# Patient Record
Sex: Female | Born: 1935 | Hispanic: No | Smoking: Former smoker
Health system: Southern US, Community
[De-identification: ages and names within clinical notes are randomized; demographics above are authoritative.]

## PROBLEM LIST (undated history)

## (undated) DIAGNOSIS — F32A Depression, unspecified: Secondary | ICD-10-CM

## (undated) DIAGNOSIS — E785 Hyperlipidemia, unspecified: Secondary | ICD-10-CM

## (undated) DIAGNOSIS — N289 Disorder of kidney and ureter, unspecified: Secondary | ICD-10-CM

## (undated) DIAGNOSIS — E119 Type 2 diabetes mellitus without complications: Secondary | ICD-10-CM

## (undated) DIAGNOSIS — I10 Essential (primary) hypertension: Secondary | ICD-10-CM

## (undated) DIAGNOSIS — I4892 Unspecified atrial flutter: Secondary | ICD-10-CM

## (undated) DIAGNOSIS — F419 Anxiety disorder, unspecified: Secondary | ICD-10-CM

## (undated) DIAGNOSIS — F329 Major depressive disorder, single episode, unspecified: Secondary | ICD-10-CM

## (undated) DIAGNOSIS — E559 Vitamin D deficiency, unspecified: Secondary | ICD-10-CM

## (undated) HISTORY — DX: Type 2 diabetes mellitus without complications: E11.9

## (undated) HISTORY — DX: Essential (primary) hypertension: I10

---

## 2008-02-02 DIAGNOSIS — I2510 Atherosclerotic heart disease of native coronary artery without angina pectoris: Secondary | ICD-10-CM | POA: Insufficient documentation

## 2011-07-24 DIAGNOSIS — Z79899 Other long term (current) drug therapy: Secondary | ICD-10-CM | POA: Diagnosis not present

## 2011-07-24 DIAGNOSIS — E785 Hyperlipidemia, unspecified: Secondary | ICD-10-CM | POA: Diagnosis not present

## 2011-07-24 DIAGNOSIS — E119 Type 2 diabetes mellitus without complications: Secondary | ICD-10-CM | POA: Diagnosis not present

## 2011-07-25 DIAGNOSIS — H04129 Dry eye syndrome of unspecified lacrimal gland: Secondary | ICD-10-CM | POA: Diagnosis not present

## 2011-07-25 DIAGNOSIS — H35049 Retinal micro-aneurysms, unspecified, unspecified eye: Secondary | ICD-10-CM | POA: Diagnosis not present

## 2011-07-25 DIAGNOSIS — H43819 Vitreous degeneration, unspecified eye: Secondary | ICD-10-CM | POA: Diagnosis not present

## 2011-07-25 DIAGNOSIS — E119 Type 2 diabetes mellitus without complications: Secondary | ICD-10-CM | POA: Diagnosis not present

## 2011-07-30 DIAGNOSIS — E785 Hyperlipidemia, unspecified: Secondary | ICD-10-CM | POA: Diagnosis not present

## 2011-07-30 DIAGNOSIS — I10 Essential (primary) hypertension: Secondary | ICD-10-CM | POA: Diagnosis not present

## 2011-07-30 DIAGNOSIS — E1139 Type 2 diabetes mellitus with other diabetic ophthalmic complication: Secondary | ICD-10-CM | POA: Diagnosis not present

## 2011-07-30 DIAGNOSIS — E1142 Type 2 diabetes mellitus with diabetic polyneuropathy: Secondary | ICD-10-CM | POA: Diagnosis not present

## 2011-07-30 DIAGNOSIS — H579 Unspecified disorder of eye and adnexa: Secondary | ICD-10-CM | POA: Diagnosis not present

## 2011-07-30 DIAGNOSIS — E1149 Type 2 diabetes mellitus with other diabetic neurological complication: Secondary | ICD-10-CM | POA: Diagnosis not present

## 2011-07-30 DIAGNOSIS — E1169 Type 2 diabetes mellitus with other specified complication: Secondary | ICD-10-CM | POA: Diagnosis not present

## 2011-07-30 DIAGNOSIS — N189 Chronic kidney disease, unspecified: Secondary | ICD-10-CM | POA: Diagnosis not present

## 2011-07-30 DIAGNOSIS — E11319 Type 2 diabetes mellitus with unspecified diabetic retinopathy without macular edema: Secondary | ICD-10-CM | POA: Diagnosis not present

## 2011-10-27 DIAGNOSIS — I10 Essential (primary) hypertension: Secondary | ICD-10-CM | POA: Diagnosis not present

## 2011-10-27 DIAGNOSIS — I2510 Atherosclerotic heart disease of native coronary artery without angina pectoris: Secondary | ICD-10-CM | POA: Diagnosis not present

## 2011-10-27 DIAGNOSIS — R32 Unspecified urinary incontinence: Secondary | ICD-10-CM | POA: Diagnosis not present

## 2011-10-27 DIAGNOSIS — E785 Hyperlipidemia, unspecified: Secondary | ICD-10-CM | POA: Diagnosis not present

## 2011-10-27 DIAGNOSIS — R7309 Other abnormal glucose: Secondary | ICD-10-CM | POA: Diagnosis not present

## 2012-01-19 DIAGNOSIS — I10 Essential (primary) hypertension: Secondary | ICD-10-CM | POA: Diagnosis not present

## 2012-01-19 DIAGNOSIS — E785 Hyperlipidemia, unspecified: Secondary | ICD-10-CM | POA: Diagnosis not present

## 2012-01-19 DIAGNOSIS — R944 Abnormal results of kidney function studies: Secondary | ICD-10-CM | POA: Diagnosis not present

## 2012-01-19 DIAGNOSIS — E119 Type 2 diabetes mellitus without complications: Secondary | ICD-10-CM | POA: Diagnosis not present

## 2012-03-16 DIAGNOSIS — K5900 Constipation, unspecified: Secondary | ICD-10-CM | POA: Diagnosis not present

## 2012-03-16 DIAGNOSIS — R61 Generalized hyperhidrosis: Secondary | ICD-10-CM | POA: Diagnosis not present

## 2012-03-16 DIAGNOSIS — K219 Gastro-esophageal reflux disease without esophagitis: Secondary | ICD-10-CM | POA: Diagnosis not present

## 2012-03-16 DIAGNOSIS — R32 Unspecified urinary incontinence: Secondary | ICD-10-CM | POA: Diagnosis not present

## 2012-03-23 DIAGNOSIS — N318 Other neuromuscular dysfunction of bladder: Secondary | ICD-10-CM | POA: Diagnosis not present

## 2012-03-23 DIAGNOSIS — R3915 Urgency of urination: Secondary | ICD-10-CM | POA: Diagnosis not present

## 2012-03-23 DIAGNOSIS — I10 Essential (primary) hypertension: Secondary | ICD-10-CM | POA: Diagnosis not present

## 2012-03-23 DIAGNOSIS — E785 Hyperlipidemia, unspecified: Secondary | ICD-10-CM | POA: Diagnosis not present

## 2012-04-20 DIAGNOSIS — Z23 Encounter for immunization: Secondary | ICD-10-CM | POA: Diagnosis not present

## 2012-04-26 DIAGNOSIS — N3941 Urge incontinence: Secondary | ICD-10-CM | POA: Diagnosis not present

## 2012-04-26 DIAGNOSIS — J329 Chronic sinusitis, unspecified: Secondary | ICD-10-CM | POA: Diagnosis not present

## 2012-06-03 DIAGNOSIS — D3130 Benign neoplasm of unspecified choroid: Secondary | ICD-10-CM | POA: Diagnosis not present

## 2012-06-07 DIAGNOSIS — L03019 Cellulitis of unspecified finger: Secondary | ICD-10-CM | POA: Diagnosis not present

## 2012-06-07 DIAGNOSIS — R32 Unspecified urinary incontinence: Secondary | ICD-10-CM | POA: Diagnosis not present

## 2012-06-07 DIAGNOSIS — L02519 Cutaneous abscess of unspecified hand: Secondary | ICD-10-CM | POA: Diagnosis not present

## 2012-06-11 DIAGNOSIS — L03019 Cellulitis of unspecified finger: Secondary | ICD-10-CM | POA: Diagnosis not present

## 2012-06-11 DIAGNOSIS — L02519 Cutaneous abscess of unspecified hand: Secondary | ICD-10-CM | POA: Diagnosis not present

## 2012-07-26 DIAGNOSIS — R079 Chest pain, unspecified: Secondary | ICD-10-CM | POA: Diagnosis not present

## 2012-07-26 DIAGNOSIS — M79609 Pain in unspecified limb: Secondary | ICD-10-CM | POA: Diagnosis not present

## 2012-07-26 DIAGNOSIS — R799 Abnormal finding of blood chemistry, unspecified: Secondary | ICD-10-CM | POA: Diagnosis not present

## 2012-08-23 DIAGNOSIS — N289 Disorder of kidney and ureter, unspecified: Secondary | ICD-10-CM | POA: Diagnosis not present

## 2012-08-23 DIAGNOSIS — R7301 Impaired fasting glucose: Secondary | ICD-10-CM | POA: Diagnosis not present

## 2012-08-23 DIAGNOSIS — R799 Abnormal finding of blood chemistry, unspecified: Secondary | ICD-10-CM | POA: Diagnosis not present

## 2012-08-27 DIAGNOSIS — E785 Hyperlipidemia, unspecified: Secondary | ICD-10-CM | POA: Diagnosis not present

## 2012-08-27 DIAGNOSIS — I10 Essential (primary) hypertension: Secondary | ICD-10-CM | POA: Diagnosis not present

## 2012-08-27 DIAGNOSIS — I2510 Atherosclerotic heart disease of native coronary artery without angina pectoris: Secondary | ICD-10-CM | POA: Diagnosis not present

## 2012-09-27 DIAGNOSIS — R7989 Other specified abnormal findings of blood chemistry: Secondary | ICD-10-CM | POA: Diagnosis not present

## 2012-09-27 DIAGNOSIS — L408 Other psoriasis: Secondary | ICD-10-CM | POA: Diagnosis not present

## 2012-09-27 DIAGNOSIS — M064 Inflammatory polyarthropathy: Secondary | ICD-10-CM | POA: Diagnosis not present

## 2012-09-27 DIAGNOSIS — M1990 Unspecified osteoarthritis, unspecified site: Secondary | ICD-10-CM | POA: Diagnosis not present

## 2012-09-27 DIAGNOSIS — R894 Abnormal immunological findings in specimens from other organs, systems and tissues: Secondary | ICD-10-CM | POA: Diagnosis not present

## 2012-09-27 DIAGNOSIS — R5383 Other fatigue: Secondary | ICD-10-CM | POA: Diagnosis not present

## 2012-09-27 DIAGNOSIS — R5381 Other malaise: Secondary | ICD-10-CM | POA: Diagnosis not present

## 2012-10-29 DIAGNOSIS — F329 Major depressive disorder, single episode, unspecified: Secondary | ICD-10-CM | POA: Diagnosis not present

## 2012-10-29 DIAGNOSIS — R5383 Other fatigue: Secondary | ICD-10-CM | POA: Diagnosis not present

## 2012-10-29 DIAGNOSIS — Z0000 Encounter for general adult medical examination without abnormal findings: Secondary | ICD-10-CM | POA: Diagnosis not present

## 2012-11-29 DIAGNOSIS — M109 Gout, unspecified: Secondary | ICD-10-CM | POA: Diagnosis not present

## 2012-12-09 DIAGNOSIS — R0789 Other chest pain: Secondary | ICD-10-CM | POA: Diagnosis not present

## 2012-12-09 DIAGNOSIS — K219 Gastro-esophageal reflux disease without esophagitis: Secondary | ICD-10-CM | POA: Diagnosis not present

## 2012-12-09 DIAGNOSIS — F329 Major depressive disorder, single episode, unspecified: Secondary | ICD-10-CM | POA: Diagnosis not present

## 2013-01-17 DIAGNOSIS — H35049 Retinal micro-aneurysms, unspecified, unspecified eye: Secondary | ICD-10-CM | POA: Diagnosis not present

## 2013-01-17 DIAGNOSIS — H26499 Other secondary cataract, unspecified eye: Secondary | ICD-10-CM | POA: Diagnosis not present

## 2013-01-17 DIAGNOSIS — H43819 Vitreous degeneration, unspecified eye: Secondary | ICD-10-CM | POA: Diagnosis not present

## 2013-01-17 DIAGNOSIS — E119 Type 2 diabetes mellitus without complications: Secondary | ICD-10-CM | POA: Diagnosis not present

## 2013-03-09 DIAGNOSIS — Z79899 Other long term (current) drug therapy: Secondary | ICD-10-CM | POA: Diagnosis not present

## 2013-03-09 DIAGNOSIS — E785 Hyperlipidemia, unspecified: Secondary | ICD-10-CM | POA: Diagnosis not present

## 2013-03-09 DIAGNOSIS — M109 Gout, unspecified: Secondary | ICD-10-CM | POA: Diagnosis not present

## 2013-03-09 DIAGNOSIS — E1149 Type 2 diabetes mellitus with other diabetic neurological complication: Secondary | ICD-10-CM | POA: Diagnosis not present

## 2013-03-09 DIAGNOSIS — E1142 Type 2 diabetes mellitus with diabetic polyneuropathy: Secondary | ICD-10-CM | POA: Diagnosis not present

## 2013-03-09 DIAGNOSIS — I10 Essential (primary) hypertension: Secondary | ICD-10-CM | POA: Diagnosis not present

## 2013-03-09 DIAGNOSIS — E1169 Type 2 diabetes mellitus with other specified complication: Secondary | ICD-10-CM | POA: Diagnosis not present

## 2013-04-12 DIAGNOSIS — Z23 Encounter for immunization: Secondary | ICD-10-CM | POA: Diagnosis not present

## 2013-04-12 DIAGNOSIS — I10 Essential (primary) hypertension: Secondary | ICD-10-CM | POA: Diagnosis not present

## 2013-04-21 DIAGNOSIS — Z79899 Other long term (current) drug therapy: Secondary | ICD-10-CM | POA: Diagnosis not present

## 2013-04-21 DIAGNOSIS — M109 Gout, unspecified: Secondary | ICD-10-CM | POA: Diagnosis not present

## 2013-04-21 DIAGNOSIS — I10 Essential (primary) hypertension: Secondary | ICD-10-CM | POA: Diagnosis not present

## 2013-06-03 DIAGNOSIS — M109 Gout, unspecified: Secondary | ICD-10-CM | POA: Diagnosis not present

## 2013-06-03 DIAGNOSIS — E1169 Type 2 diabetes mellitus with other specified complication: Secondary | ICD-10-CM | POA: Diagnosis not present

## 2013-06-03 DIAGNOSIS — E785 Hyperlipidemia, unspecified: Secondary | ICD-10-CM | POA: Diagnosis not present

## 2013-06-03 DIAGNOSIS — Z79899 Other long term (current) drug therapy: Secondary | ICD-10-CM | POA: Diagnosis not present

## 2013-06-09 DIAGNOSIS — Z23 Encounter for immunization: Secondary | ICD-10-CM | POA: Diagnosis not present

## 2013-06-09 DIAGNOSIS — E1169 Type 2 diabetes mellitus with other specified complication: Secondary | ICD-10-CM | POA: Diagnosis not present

## 2013-06-09 DIAGNOSIS — M109 Gout, unspecified: Secondary | ICD-10-CM | POA: Diagnosis not present

## 2013-06-09 DIAGNOSIS — N183 Chronic kidney disease, stage 3 unspecified: Secondary | ICD-10-CM | POA: Diagnosis not present

## 2013-06-09 DIAGNOSIS — R5383 Other fatigue: Secondary | ICD-10-CM | POA: Diagnosis not present

## 2013-06-09 DIAGNOSIS — G478 Other sleep disorders: Secondary | ICD-10-CM | POA: Diagnosis not present

## 2013-06-09 DIAGNOSIS — E785 Hyperlipidemia, unspecified: Secondary | ICD-10-CM | POA: Diagnosis not present

## 2013-06-09 DIAGNOSIS — N390 Urinary tract infection, site not specified: Secondary | ICD-10-CM | POA: Diagnosis not present

## 2013-06-09 DIAGNOSIS — I129 Hypertensive chronic kidney disease with stage 1 through stage 4 chronic kidney disease, or unspecified chronic kidney disease: Secondary | ICD-10-CM | POA: Diagnosis not present

## 2013-06-09 DIAGNOSIS — R5381 Other malaise: Secondary | ICD-10-CM | POA: Diagnosis not present

## 2013-06-09 DIAGNOSIS — R32 Unspecified urinary incontinence: Secondary | ICD-10-CM | POA: Diagnosis not present

## 2013-07-07 DIAGNOSIS — M109 Gout, unspecified: Secondary | ICD-10-CM | POA: Diagnosis not present

## 2013-07-07 DIAGNOSIS — M129 Arthropathy, unspecified: Secondary | ICD-10-CM | POA: Diagnosis not present

## 2013-07-07 DIAGNOSIS — Z79899 Other long term (current) drug therapy: Secondary | ICD-10-CM | POA: Diagnosis not present

## 2013-07-07 DIAGNOSIS — I10 Essential (primary) hypertension: Secondary | ICD-10-CM | POA: Diagnosis not present

## 2013-10-26 DIAGNOSIS — Z79899 Other long term (current) drug therapy: Secondary | ICD-10-CM | POA: Diagnosis not present

## 2013-10-26 DIAGNOSIS — E785 Hyperlipidemia, unspecified: Secondary | ICD-10-CM | POA: Diagnosis not present

## 2013-10-26 DIAGNOSIS — M109 Gout, unspecified: Secondary | ICD-10-CM | POA: Diagnosis not present

## 2013-10-26 DIAGNOSIS — E1169 Type 2 diabetes mellitus with other specified complication: Secondary | ICD-10-CM | POA: Diagnosis not present

## 2013-10-26 DIAGNOSIS — E119 Type 2 diabetes mellitus without complications: Secondary | ICD-10-CM | POA: Diagnosis not present

## 2013-11-01 DIAGNOSIS — E119 Type 2 diabetes mellitus without complications: Secondary | ICD-10-CM | POA: Diagnosis not present

## 2013-11-01 DIAGNOSIS — Z79899 Other long term (current) drug therapy: Secondary | ICD-10-CM | POA: Diagnosis not present

## 2013-11-01 DIAGNOSIS — M109 Gout, unspecified: Secondary | ICD-10-CM | POA: Diagnosis not present

## 2013-11-01 DIAGNOSIS — E1169 Type 2 diabetes mellitus with other specified complication: Secondary | ICD-10-CM | POA: Diagnosis not present

## 2013-11-01 DIAGNOSIS — E785 Hyperlipidemia, unspecified: Secondary | ICD-10-CM | POA: Diagnosis not present

## 2013-11-01 DIAGNOSIS — I10 Essential (primary) hypertension: Secondary | ICD-10-CM | POA: Diagnosis not present

## 2013-11-01 DIAGNOSIS — Z23 Encounter for immunization: Secondary | ICD-10-CM | POA: Diagnosis not present

## 2013-11-30 DIAGNOSIS — I10 Essential (primary) hypertension: Secondary | ICD-10-CM | POA: Diagnosis not present

## 2013-11-30 DIAGNOSIS — R51 Headache: Secondary | ICD-10-CM | POA: Diagnosis not present

## 2013-11-30 DIAGNOSIS — E119 Type 2 diabetes mellitus without complications: Secondary | ICD-10-CM | POA: Diagnosis not present

## 2013-12-05 DIAGNOSIS — I9589 Other hypotension: Secondary | ICD-10-CM | POA: Diagnosis not present

## 2013-12-05 DIAGNOSIS — F3289 Other specified depressive episodes: Secondary | ICD-10-CM | POA: Diagnosis not present

## 2013-12-05 DIAGNOSIS — F329 Major depressive disorder, single episode, unspecified: Secondary | ICD-10-CM | POA: Diagnosis not present

## 2013-12-05 DIAGNOSIS — N3941 Urge incontinence: Secondary | ICD-10-CM | POA: Diagnosis not present

## 2014-01-16 DIAGNOSIS — Z79899 Other long term (current) drug therapy: Secondary | ICD-10-CM | POA: Diagnosis not present

## 2014-01-16 DIAGNOSIS — F0390 Unspecified dementia without behavioral disturbance: Secondary | ICD-10-CM | POA: Diagnosis not present

## 2014-01-16 DIAGNOSIS — I10 Essential (primary) hypertension: Secondary | ICD-10-CM | POA: Diagnosis not present

## 2014-01-16 DIAGNOSIS — E1139 Type 2 diabetes mellitus with other diabetic ophthalmic complication: Secondary | ICD-10-CM | POA: Diagnosis not present

## 2014-01-16 DIAGNOSIS — F0393 Unspecified dementia, unspecified severity, with mood disturbance: Secondary | ICD-10-CM | POA: Diagnosis not present

## 2014-01-16 DIAGNOSIS — M5430 Sciatica, unspecified side: Secondary | ICD-10-CM | POA: Diagnosis not present

## 2014-01-16 DIAGNOSIS — E11319 Type 2 diabetes mellitus with unspecified diabetic retinopathy without macular edema: Secondary | ICD-10-CM | POA: Diagnosis not present

## 2014-01-19 DIAGNOSIS — D3130 Benign neoplasm of unspecified choroid: Secondary | ICD-10-CM | POA: Diagnosis not present

## 2014-01-19 DIAGNOSIS — H35379 Puckering of macula, unspecified eye: Secondary | ICD-10-CM | POA: Diagnosis not present

## 2014-01-19 DIAGNOSIS — H35049 Retinal micro-aneurysms, unspecified, unspecified eye: Secondary | ICD-10-CM | POA: Diagnosis not present

## 2014-01-19 DIAGNOSIS — H43819 Vitreous degeneration, unspecified eye: Secondary | ICD-10-CM | POA: Diagnosis not present

## 2014-01-19 DIAGNOSIS — E119 Type 2 diabetes mellitus without complications: Secondary | ICD-10-CM | POA: Diagnosis not present

## 2014-01-27 DIAGNOSIS — E1169 Type 2 diabetes mellitus with other specified complication: Secondary | ICD-10-CM | POA: Diagnosis not present

## 2014-01-27 DIAGNOSIS — Z79899 Other long term (current) drug therapy: Secondary | ICD-10-CM | POA: Diagnosis not present

## 2014-01-27 DIAGNOSIS — E785 Hyperlipidemia, unspecified: Secondary | ICD-10-CM | POA: Diagnosis not present

## 2014-02-06 DIAGNOSIS — E1139 Type 2 diabetes mellitus with other diabetic ophthalmic complication: Secondary | ICD-10-CM | POA: Diagnosis not present

## 2014-02-06 DIAGNOSIS — E11319 Type 2 diabetes mellitus with unspecified diabetic retinopathy without macular edema: Secondary | ICD-10-CM | POA: Diagnosis not present

## 2014-02-06 DIAGNOSIS — Z713 Dietary counseling and surveillance: Secondary | ICD-10-CM | POA: Diagnosis not present

## 2014-02-21 DIAGNOSIS — M545 Low back pain, unspecified: Secondary | ICD-10-CM | POA: Diagnosis not present

## 2014-02-21 DIAGNOSIS — M542 Cervicalgia: Secondary | ICD-10-CM | POA: Diagnosis not present

## 2014-02-23 DIAGNOSIS — E785 Hyperlipidemia, unspecified: Secondary | ICD-10-CM | POA: Diagnosis not present

## 2014-02-23 DIAGNOSIS — E1169 Type 2 diabetes mellitus with other specified complication: Secondary | ICD-10-CM | POA: Diagnosis not present

## 2014-02-23 DIAGNOSIS — I10 Essential (primary) hypertension: Secondary | ICD-10-CM | POA: Diagnosis not present

## 2014-02-23 DIAGNOSIS — Z79899 Other long term (current) drug therapy: Secondary | ICD-10-CM | POA: Diagnosis not present

## 2014-03-21 DIAGNOSIS — Z0000 Encounter for general adult medical examination without abnormal findings: Secondary | ICD-10-CM | POA: Diagnosis not present

## 2014-03-23 DIAGNOSIS — M542 Cervicalgia: Secondary | ICD-10-CM | POA: Diagnosis not present

## 2014-03-23 DIAGNOSIS — F0390 Unspecified dementia without behavioral disturbance: Secondary | ICD-10-CM | POA: Diagnosis not present

## 2014-03-23 DIAGNOSIS — Z79899 Other long term (current) drug therapy: Secondary | ICD-10-CM | POA: Diagnosis not present

## 2014-03-23 DIAGNOSIS — I10 Essential (primary) hypertension: Secondary | ICD-10-CM | POA: Diagnosis not present

## 2014-03-23 DIAGNOSIS — F0393 Unspecified dementia, unspecified severity, with mood disturbance: Secondary | ICD-10-CM | POA: Diagnosis not present

## 2014-03-23 DIAGNOSIS — M5430 Sciatica, unspecified side: Secondary | ICD-10-CM | POA: Diagnosis not present

## 2014-03-23 DIAGNOSIS — M79609 Pain in unspecified limb: Secondary | ICD-10-CM | POA: Diagnosis not present

## 2014-04-06 DIAGNOSIS — R5381 Other malaise: Secondary | ICD-10-CM | POA: Diagnosis not present

## 2014-04-06 DIAGNOSIS — IMO0001 Reserved for inherently not codable concepts without codable children: Secondary | ICD-10-CM | POA: Diagnosis not present

## 2014-04-06 DIAGNOSIS — M5430 Sciatica, unspecified side: Secondary | ICD-10-CM | POA: Diagnosis not present

## 2014-04-06 DIAGNOSIS — M79609 Pain in unspecified limb: Secondary | ICD-10-CM | POA: Diagnosis not present

## 2014-04-06 DIAGNOSIS — R5383 Other fatigue: Secondary | ICD-10-CM | POA: Diagnosis not present

## 2014-04-06 DIAGNOSIS — M542 Cervicalgia: Secondary | ICD-10-CM | POA: Diagnosis not present

## 2014-04-12 DIAGNOSIS — R5383 Other fatigue: Secondary | ICD-10-CM | POA: Diagnosis not present

## 2014-04-12 DIAGNOSIS — M5430 Sciatica, unspecified side: Secondary | ICD-10-CM | POA: Diagnosis not present

## 2014-04-12 DIAGNOSIS — R5381 Other malaise: Secondary | ICD-10-CM | POA: Diagnosis not present

## 2014-04-12 DIAGNOSIS — M542 Cervicalgia: Secondary | ICD-10-CM | POA: Diagnosis not present

## 2014-04-12 DIAGNOSIS — IMO0001 Reserved for inherently not codable concepts without codable children: Secondary | ICD-10-CM | POA: Diagnosis not present

## 2014-04-12 DIAGNOSIS — M79609 Pain in unspecified limb: Secondary | ICD-10-CM | POA: Diagnosis not present

## 2014-04-19 DIAGNOSIS — R5381 Other malaise: Secondary | ICD-10-CM | POA: Diagnosis not present

## 2014-04-19 DIAGNOSIS — IMO0001 Reserved for inherently not codable concepts without codable children: Secondary | ICD-10-CM | POA: Diagnosis not present

## 2014-04-19 DIAGNOSIS — R5383 Other fatigue: Secondary | ICD-10-CM | POA: Diagnosis not present

## 2014-04-19 DIAGNOSIS — M5430 Sciatica, unspecified side: Secondary | ICD-10-CM | POA: Diagnosis not present

## 2014-04-19 DIAGNOSIS — M79609 Pain in unspecified limb: Secondary | ICD-10-CM | POA: Diagnosis not present

## 2014-04-19 DIAGNOSIS — M542 Cervicalgia: Secondary | ICD-10-CM | POA: Diagnosis not present

## 2014-04-26 DIAGNOSIS — F442 Dissociative stupor: Secondary | ICD-10-CM | POA: Diagnosis not present

## 2014-04-26 DIAGNOSIS — R531 Weakness: Secondary | ICD-10-CM | POA: Diagnosis not present

## 2014-04-26 DIAGNOSIS — M4802 Spinal stenosis, cervical region: Secondary | ICD-10-CM | POA: Diagnosis not present

## 2014-04-26 DIAGNOSIS — M4800 Spinal stenosis, site unspecified: Secondary | ICD-10-CM | POA: Diagnosis not present

## 2014-05-03 DIAGNOSIS — R531 Weakness: Secondary | ICD-10-CM | POA: Diagnosis not present

## 2014-05-03 DIAGNOSIS — M4800 Spinal stenosis, site unspecified: Secondary | ICD-10-CM | POA: Diagnosis not present

## 2014-05-03 DIAGNOSIS — M109 Gout, unspecified: Secondary | ICD-10-CM | POA: Diagnosis not present

## 2014-05-03 DIAGNOSIS — F442 Dissociative stupor: Secondary | ICD-10-CM | POA: Diagnosis not present

## 2014-05-03 DIAGNOSIS — E11319 Type 2 diabetes mellitus with unspecified diabetic retinopathy without macular edema: Secondary | ICD-10-CM | POA: Diagnosis not present

## 2014-05-03 DIAGNOSIS — I10 Essential (primary) hypertension: Secondary | ICD-10-CM | POA: Diagnosis not present

## 2014-05-03 DIAGNOSIS — M4802 Spinal stenosis, cervical region: Secondary | ICD-10-CM | POA: Diagnosis not present

## 2014-05-03 DIAGNOSIS — E1169 Type 2 diabetes mellitus with other specified complication: Secondary | ICD-10-CM | POA: Diagnosis not present

## 2014-05-03 HISTORY — DX: Gout, unspecified: M10.9

## 2014-05-10 DIAGNOSIS — M4800 Spinal stenosis, site unspecified: Secondary | ICD-10-CM | POA: Diagnosis not present

## 2014-05-10 DIAGNOSIS — F442 Dissociative stupor: Secondary | ICD-10-CM | POA: Diagnosis not present

## 2014-05-10 DIAGNOSIS — R531 Weakness: Secondary | ICD-10-CM | POA: Diagnosis not present

## 2014-05-10 DIAGNOSIS — M4802 Spinal stenosis, cervical region: Secondary | ICD-10-CM | POA: Diagnosis not present

## 2014-05-11 DIAGNOSIS — R197 Diarrhea, unspecified: Secondary | ICD-10-CM | POA: Diagnosis not present

## 2014-05-11 DIAGNOSIS — E785 Hyperlipidemia, unspecified: Secondary | ICD-10-CM | POA: Diagnosis not present

## 2014-05-11 DIAGNOSIS — R634 Abnormal weight loss: Secondary | ICD-10-CM | POA: Diagnosis not present

## 2014-05-11 DIAGNOSIS — R748 Abnormal levels of other serum enzymes: Secondary | ICD-10-CM | POA: Diagnosis not present

## 2014-05-11 DIAGNOSIS — E1169 Type 2 diabetes mellitus with other specified complication: Secondary | ICD-10-CM | POA: Diagnosis not present

## 2014-05-19 DIAGNOSIS — F329 Major depressive disorder, single episode, unspecified: Secondary | ICD-10-CM | POA: Diagnosis not present

## 2014-05-19 DIAGNOSIS — R32 Unspecified urinary incontinence: Secondary | ICD-10-CM | POA: Diagnosis not present

## 2014-05-19 DIAGNOSIS — E785 Hyperlipidemia, unspecified: Secondary | ICD-10-CM | POA: Diagnosis not present

## 2014-05-19 DIAGNOSIS — I10 Essential (primary) hypertension: Secondary | ICD-10-CM | POA: Diagnosis not present

## 2014-05-19 DIAGNOSIS — K219 Gastro-esophageal reflux disease without esophagitis: Secondary | ICD-10-CM | POA: Diagnosis not present

## 2014-05-19 DIAGNOSIS — N183 Chronic kidney disease, stage 3 (moderate): Secondary | ICD-10-CM | POA: Diagnosis not present

## 2014-05-19 DIAGNOSIS — E1122 Type 2 diabetes mellitus with diabetic chronic kidney disease: Secondary | ICD-10-CM | POA: Diagnosis not present

## 2014-05-19 DIAGNOSIS — I2510 Atherosclerotic heart disease of native coronary artery without angina pectoris: Secondary | ICD-10-CM | POA: Diagnosis not present

## 2014-06-26 DIAGNOSIS — R159 Full incontinence of feces: Secondary | ICD-10-CM | POA: Diagnosis not present

## 2014-07-19 DIAGNOSIS — K219 Gastro-esophageal reflux disease without esophagitis: Secondary | ICD-10-CM | POA: Diagnosis not present

## 2014-07-19 DIAGNOSIS — E559 Vitamin D deficiency, unspecified: Secondary | ICD-10-CM | POA: Diagnosis not present

## 2014-07-19 DIAGNOSIS — N183 Chronic kidney disease, stage 3 (moderate): Secondary | ICD-10-CM | POA: Diagnosis not present

## 2014-07-19 DIAGNOSIS — E785 Hyperlipidemia, unspecified: Secondary | ICD-10-CM | POA: Diagnosis not present

## 2014-07-19 DIAGNOSIS — E119 Type 2 diabetes mellitus without complications: Secondary | ICD-10-CM | POA: Diagnosis not present

## 2014-07-19 DIAGNOSIS — F329 Major depressive disorder, single episode, unspecified: Secondary | ICD-10-CM | POA: Diagnosis not present

## 2014-07-19 DIAGNOSIS — M109 Gout, unspecified: Secondary | ICD-10-CM | POA: Diagnosis not present

## 2014-07-19 DIAGNOSIS — I10 Essential (primary) hypertension: Secondary | ICD-10-CM | POA: Diagnosis not present

## 2014-09-11 DIAGNOSIS — I10 Essential (primary) hypertension: Secondary | ICD-10-CM | POA: Diagnosis not present

## 2014-09-11 DIAGNOSIS — R0989 Other specified symptoms and signs involving the circulatory and respiratory systems: Secondary | ICD-10-CM | POA: Diagnosis not present

## 2014-09-11 DIAGNOSIS — K529 Noninfective gastroenteritis and colitis, unspecified: Secondary | ICD-10-CM | POA: Diagnosis not present

## 2014-09-18 DIAGNOSIS — N261 Atrophy of kidney (terminal): Secondary | ICD-10-CM | POA: Diagnosis not present

## 2014-09-18 DIAGNOSIS — E1122 Type 2 diabetes mellitus with diabetic chronic kidney disease: Secondary | ICD-10-CM | POA: Diagnosis not present

## 2014-09-18 DIAGNOSIS — R0989 Other specified symptoms and signs involving the circulatory and respiratory systems: Secondary | ICD-10-CM | POA: Diagnosis not present

## 2014-09-18 DIAGNOSIS — N183 Chronic kidney disease, stage 3 (moderate): Secondary | ICD-10-CM | POA: Diagnosis not present

## 2014-11-16 DIAGNOSIS — M545 Low back pain: Secondary | ICD-10-CM | POA: Diagnosis not present

## 2014-11-16 DIAGNOSIS — M79646 Pain in unspecified finger(s): Secondary | ICD-10-CM | POA: Diagnosis not present

## 2014-11-16 DIAGNOSIS — M6530 Trigger finger, unspecified finger: Secondary | ICD-10-CM | POA: Diagnosis not present

## 2014-11-16 DIAGNOSIS — M542 Cervicalgia: Secondary | ICD-10-CM | POA: Diagnosis not present

## 2014-11-22 DIAGNOSIS — E559 Vitamin D deficiency, unspecified: Secondary | ICD-10-CM | POA: Diagnosis not present

## 2014-12-05 DIAGNOSIS — M545 Low back pain: Secondary | ICD-10-CM | POA: Diagnosis not present

## 2014-12-05 DIAGNOSIS — M19041 Primary osteoarthritis, right hand: Secondary | ICD-10-CM | POA: Diagnosis not present

## 2014-12-05 DIAGNOSIS — M542 Cervicalgia: Secondary | ICD-10-CM | POA: Diagnosis not present

## 2014-12-11 DIAGNOSIS — M545 Low back pain: Secondary | ICD-10-CM | POA: Diagnosis not present

## 2014-12-12 DIAGNOSIS — N3941 Urge incontinence: Secondary | ICD-10-CM | POA: Diagnosis not present

## 2014-12-12 DIAGNOSIS — N3944 Nocturnal enuresis: Secondary | ICD-10-CM | POA: Diagnosis not present

## 2014-12-12 DIAGNOSIS — R351 Nocturia: Secondary | ICD-10-CM | POA: Diagnosis not present

## 2014-12-15 DIAGNOSIS — R42 Dizziness and giddiness: Secondary | ICD-10-CM | POA: Diagnosis not present

## 2014-12-15 DIAGNOSIS — M545 Low back pain: Secondary | ICD-10-CM | POA: Diagnosis not present

## 2014-12-20 DIAGNOSIS — M545 Low back pain: Secondary | ICD-10-CM | POA: Diagnosis not present

## 2014-12-21 DIAGNOSIS — M545 Low back pain: Secondary | ICD-10-CM | POA: Diagnosis not present

## 2014-12-25 DIAGNOSIS — M545 Low back pain: Secondary | ICD-10-CM | POA: Diagnosis not present

## 2014-12-27 DIAGNOSIS — M545 Low back pain: Secondary | ICD-10-CM | POA: Diagnosis not present

## 2015-01-01 DIAGNOSIS — M545 Low back pain: Secondary | ICD-10-CM | POA: Diagnosis not present

## 2015-01-02 DIAGNOSIS — M19041 Primary osteoarthritis, right hand: Secondary | ICD-10-CM | POA: Diagnosis not present

## 2015-01-02 DIAGNOSIS — M545 Low back pain: Secondary | ICD-10-CM | POA: Diagnosis not present

## 2015-01-02 DIAGNOSIS — M5441 Lumbago with sciatica, right side: Secondary | ICD-10-CM | POA: Diagnosis not present

## 2015-01-03 DIAGNOSIS — M545 Low back pain: Secondary | ICD-10-CM | POA: Diagnosis not present

## 2015-01-17 DIAGNOSIS — I10 Essential (primary) hypertension: Secondary | ICD-10-CM | POA: Diagnosis not present

## 2015-01-17 DIAGNOSIS — N3941 Urge incontinence: Secondary | ICD-10-CM | POA: Diagnosis not present

## 2015-01-17 DIAGNOSIS — N3944 Nocturnal enuresis: Secondary | ICD-10-CM | POA: Diagnosis not present

## 2015-01-17 DIAGNOSIS — I2510 Atherosclerotic heart disease of native coronary artery without angina pectoris: Secondary | ICD-10-CM | POA: Diagnosis not present

## 2015-01-17 DIAGNOSIS — R351 Nocturia: Secondary | ICD-10-CM | POA: Diagnosis not present

## 2015-01-17 DIAGNOSIS — E785 Hyperlipidemia, unspecified: Secondary | ICD-10-CM | POA: Diagnosis not present

## 2015-01-17 DIAGNOSIS — R32 Unspecified urinary incontinence: Secondary | ICD-10-CM | POA: Diagnosis not present

## 2015-01-17 DIAGNOSIS — M1990 Unspecified osteoarthritis, unspecified site: Secondary | ICD-10-CM | POA: Diagnosis not present

## 2015-01-17 DIAGNOSIS — E1122 Type 2 diabetes mellitus with diabetic chronic kidney disease: Secondary | ICD-10-CM | POA: Diagnosis not present

## 2015-01-17 DIAGNOSIS — K219 Gastro-esophageal reflux disease without esophagitis: Secondary | ICD-10-CM | POA: Diagnosis not present

## 2015-01-17 DIAGNOSIS — F329 Major depressive disorder, single episode, unspecified: Secondary | ICD-10-CM | POA: Diagnosis not present

## 2015-01-17 DIAGNOSIS — E559 Vitamin D deficiency, unspecified: Secondary | ICD-10-CM | POA: Diagnosis not present

## 2015-01-17 DIAGNOSIS — Z7189 Other specified counseling: Secondary | ICD-10-CM | POA: Diagnosis not present

## 2015-01-17 DIAGNOSIS — N183 Chronic kidney disease, stage 3 (moderate): Secondary | ICD-10-CM | POA: Diagnosis not present

## 2015-01-24 DIAGNOSIS — H26492 Other secondary cataract, left eye: Secondary | ICD-10-CM | POA: Diagnosis not present

## 2015-01-24 DIAGNOSIS — H01123 Discoid lupus erythematosus of right eye, unspecified eyelid: Secondary | ICD-10-CM | POA: Diagnosis not present

## 2015-01-24 DIAGNOSIS — H43812 Vitreous degeneration, left eye: Secondary | ICD-10-CM | POA: Diagnosis not present

## 2015-01-24 DIAGNOSIS — E119 Type 2 diabetes mellitus without complications: Secondary | ICD-10-CM | POA: Diagnosis not present

## 2015-01-24 DIAGNOSIS — H35371 Puckering of macula, right eye: Secondary | ICD-10-CM | POA: Diagnosis not present

## 2015-01-24 DIAGNOSIS — D3132 Benign neoplasm of left choroid: Secondary | ICD-10-CM | POA: Diagnosis not present

## 2015-01-29 DIAGNOSIS — I10 Essential (primary) hypertension: Secondary | ICD-10-CM | POA: Diagnosis not present

## 2015-01-30 DIAGNOSIS — M25531 Pain in right wrist: Secondary | ICD-10-CM | POA: Diagnosis not present

## 2015-01-30 DIAGNOSIS — M4722 Other spondylosis with radiculopathy, cervical region: Secondary | ICD-10-CM | POA: Diagnosis not present

## 2015-01-30 DIAGNOSIS — M542 Cervicalgia: Secondary | ICD-10-CM | POA: Diagnosis not present

## 2015-03-01 DIAGNOSIS — R197 Diarrhea, unspecified: Secondary | ICD-10-CM | POA: Diagnosis not present

## 2015-04-02 DIAGNOSIS — N183 Chronic kidney disease, stage 3 (moderate): Secondary | ICD-10-CM | POA: Diagnosis not present

## 2015-04-02 DIAGNOSIS — E559 Vitamin D deficiency, unspecified: Secondary | ICD-10-CM | POA: Diagnosis not present

## 2015-05-16 DIAGNOSIS — I10 Essential (primary) hypertension: Secondary | ICD-10-CM | POA: Diagnosis not present

## 2015-05-16 DIAGNOSIS — E1122 Type 2 diabetes mellitus with diabetic chronic kidney disease: Secondary | ICD-10-CM | POA: Diagnosis not present

## 2015-05-16 DIAGNOSIS — Z23 Encounter for immunization: Secondary | ICD-10-CM | POA: Diagnosis not present

## 2015-05-16 DIAGNOSIS — I2510 Atherosclerotic heart disease of native coronary artery without angina pectoris: Secondary | ICD-10-CM | POA: Diagnosis not present

## 2015-05-16 DIAGNOSIS — N183 Chronic kidney disease, stage 3 (moderate): Secondary | ICD-10-CM | POA: Diagnosis not present

## 2015-05-16 DIAGNOSIS — E559 Vitamin D deficiency, unspecified: Secondary | ICD-10-CM | POA: Diagnosis not present

## 2015-05-16 DIAGNOSIS — K219 Gastro-esophageal reflux disease without esophagitis: Secondary | ICD-10-CM | POA: Diagnosis not present

## 2015-05-16 DIAGNOSIS — F329 Major depressive disorder, single episode, unspecified: Secondary | ICD-10-CM | POA: Diagnosis not present

## 2015-05-16 DIAGNOSIS — E785 Hyperlipidemia, unspecified: Secondary | ICD-10-CM | POA: Diagnosis not present

## 2015-05-16 DIAGNOSIS — R32 Unspecified urinary incontinence: Secondary | ICD-10-CM | POA: Diagnosis not present

## 2015-05-16 DIAGNOSIS — M1990 Unspecified osteoarthritis, unspecified site: Secondary | ICD-10-CM | POA: Diagnosis not present

## 2015-05-17 DIAGNOSIS — R351 Nocturia: Secondary | ICD-10-CM | POA: Diagnosis not present

## 2015-06-16 DIAGNOSIS — M5489 Other dorsalgia: Secondary | ICD-10-CM | POA: Diagnosis not present

## 2015-06-29 DIAGNOSIS — H43812 Vitreous degeneration, left eye: Secondary | ICD-10-CM | POA: Diagnosis not present

## 2015-06-29 DIAGNOSIS — H04123 Dry eye syndrome of bilateral lacrimal glands: Secondary | ICD-10-CM | POA: Diagnosis not present

## 2015-06-29 DIAGNOSIS — Z961 Presence of intraocular lens: Secondary | ICD-10-CM | POA: Diagnosis not present

## 2015-06-29 DIAGNOSIS — E119 Type 2 diabetes mellitus without complications: Secondary | ICD-10-CM | POA: Diagnosis not present

## 2015-06-29 DIAGNOSIS — D3132 Benign neoplasm of left choroid: Secondary | ICD-10-CM | POA: Diagnosis not present

## 2015-06-29 DIAGNOSIS — H26492 Other secondary cataract, left eye: Secondary | ICD-10-CM | POA: Diagnosis not present

## 2015-06-29 DIAGNOSIS — I10 Essential (primary) hypertension: Secondary | ICD-10-CM | POA: Diagnosis not present

## 2015-06-29 DIAGNOSIS — H35371 Puckering of macula, right eye: Secondary | ICD-10-CM | POA: Diagnosis not present

## 2015-06-29 DIAGNOSIS — Z8673 Personal history of transient ischemic attack (TIA), and cerebral infarction without residual deficits: Secondary | ICD-10-CM | POA: Diagnosis not present

## 2015-06-29 DIAGNOSIS — H532 Diplopia: Secondary | ICD-10-CM | POA: Diagnosis not present

## 2015-07-05 DIAGNOSIS — E1122 Type 2 diabetes mellitus with diabetic chronic kidney disease: Secondary | ICD-10-CM | POA: Diagnosis not present

## 2015-08-15 DIAGNOSIS — I10 Essential (primary) hypertension: Secondary | ICD-10-CM | POA: Diagnosis not present

## 2015-08-15 DIAGNOSIS — F329 Major depressive disorder, single episode, unspecified: Secondary | ICD-10-CM | POA: Diagnosis not present

## 2015-08-15 DIAGNOSIS — E785 Hyperlipidemia, unspecified: Secondary | ICD-10-CM | POA: Diagnosis not present

## 2015-08-15 DIAGNOSIS — K219 Gastro-esophageal reflux disease without esophagitis: Secondary | ICD-10-CM | POA: Diagnosis not present

## 2015-08-15 DIAGNOSIS — I2510 Atherosclerotic heart disease of native coronary artery without angina pectoris: Secondary | ICD-10-CM | POA: Diagnosis not present

## 2015-08-15 DIAGNOSIS — N183 Chronic kidney disease, stage 3 (moderate): Secondary | ICD-10-CM | POA: Diagnosis not present

## 2015-08-15 DIAGNOSIS — Z23 Encounter for immunization: Secondary | ICD-10-CM | POA: Diagnosis not present

## 2015-08-15 DIAGNOSIS — E1122 Type 2 diabetes mellitus with diabetic chronic kidney disease: Secondary | ICD-10-CM | POA: Diagnosis not present

## 2015-08-15 DIAGNOSIS — E559 Vitamin D deficiency, unspecified: Secondary | ICD-10-CM | POA: Diagnosis not present

## 2015-08-21 DIAGNOSIS — F321 Major depressive disorder, single episode, moderate: Secondary | ICD-10-CM | POA: Diagnosis not present

## 2015-08-21 DIAGNOSIS — F4321 Adjustment disorder with depressed mood: Secondary | ICD-10-CM | POA: Diagnosis not present

## 2015-08-22 DIAGNOSIS — M79672 Pain in left foot: Secondary | ICD-10-CM | POA: Diagnosis not present

## 2015-09-04 DIAGNOSIS — F321 Major depressive disorder, single episode, moderate: Secondary | ICD-10-CM | POA: Diagnosis not present

## 2015-09-04 DIAGNOSIS — F4321 Adjustment disorder with depressed mood: Secondary | ICD-10-CM | POA: Diagnosis not present

## 2015-09-07 ENCOUNTER — Ambulatory Visit: Payer: Self-pay

## 2015-09-12 ENCOUNTER — Other Ambulatory Visit: Payer: Self-pay

## 2015-09-12 VITALS — Wt 160.0 lb

## 2015-09-12 DIAGNOSIS — E119 Type 2 diabetes mellitus without complications: Secondary | ICD-10-CM | POA: Insufficient documentation

## 2015-09-12 DIAGNOSIS — L299 Pruritus, unspecified: Secondary | ICD-10-CM

## 2015-09-12 DIAGNOSIS — I10 Essential (primary) hypertension: Secondary | ICD-10-CM

## 2015-09-12 DIAGNOSIS — F32A Depression, unspecified: Secondary | ICD-10-CM

## 2015-09-12 DIAGNOSIS — E1110 Type 2 diabetes mellitus with ketoacidosis without coma: Secondary | ICD-10-CM

## 2015-09-12 DIAGNOSIS — F329 Major depressive disorder, single episode, unspecified: Secondary | ICD-10-CM

## 2015-09-12 NOTE — Patient Outreach (Addendum)
[facility]) Care Management  09/12/2015  Emily Waller Nov 18, 1927 [id number]  Referral Date:  09/03/2015 Source:  MD Issue:  DM, HTN.  Patient has depression and health issues; she needs a Marine scientist and Education officer, museum.   Outreach call #1 to patient.  Patient not reached.  RN CM left HIPAA compliant voice message with name and number for call back.  RN CM scheduled for next contact call within 1 week.  Patient returned call back and screening completed.    Providers: Primary MD: Dr. Orpah Melter  HH: none  Insurance:  Medicare   Social: Patient lives in her daughters basement apartment but family works during the day.  Patient considering getting additional help and that she can afford to do this.  States of past living arrangement at [facility] prior to moving to daughters home/basement.  Mobility: Ambulates without any assistive devices.   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   Falls: none  Fear of falling and has not been going outside alone due to fear of falling.   Pain: back Depression:  Active with counseling for issues surrounding around grieving over her husband.  States her church was not supportive of her loss during that time and patient felt abandoned.  Transportation: self or family Caregiver: daughter Emily Waller is a retired Community education officer.  Scientist, physiological: Resources:  SS, Firefighter DME:  CBG meter, BP cuff, (no home scales)  THN conditions:  DM, HTN Patient not checking home BS and BP cuff in the home but states she is not able to self check her own and needs someone else to check.  Weight 160   Skin: itchy - unknown cause/diagnosis.  Patient Itching in hair, back of neck and down one side of backside of body for a couple of months.  Patient has not reported to the MD.  Patient states she knows she has a lot of allergies but has never experienced this type of itching.  States she does not  believe it to be related to any of her medications.   Medications:  Patient taking more than 10 medications  Co-pay cost issues: none Flu Vaccine: 05/16/15 Pneumonia Vaccine:  2013 and 2017 Patient has been taking her own medications.  Patient realizes she may need additional help with her medications due to noting short term member issues.  Patient needs medication management assistance.  Plan:  [facility] RN CM Referral  HTN, DM -Medication  Management tips and education (perhaps family can prepare med box for patient).  -Needs instruction on use of CBG meter and BP cuff. -F/u with patient regarding if patient scheduled MD appt to f/u on itching.    [facility] Pharmacy Referral  -taking more than 10 medications  SW -No SW needs identified this call.   Patient active with counseling and denied any financial issues.   -RN CM to continue to assess for any new issues.   RN CM advised in next contact call with [place] within next 10 business days.  RN CM advised to please notify MD of any changes in condition prior to scheduled appt's.   RN CM provided contact name and # [phone] or main office # [phone] and 24-hour nurse line # [phone].  RN CM confirmed patient is aware of 911 services for urgent emergency needs.  Mariann Laster, RN, BSN, [facility], CCM  [facility], general practice Coordinator [phone] Direct [phone] Cell [phone] Office  [phone] Fax

## 2015-09-12 NOTE — Addendum Note (Signed)
Addended by: Standley Brooking on: 09/12/2015 03:39 PM   Modules accepted: Orders

## 2015-09-13 ENCOUNTER — Ambulatory Visit: Payer: Medicare Other

## 2015-09-13 DIAGNOSIS — L309 Dermatitis, unspecified: Secondary | ICD-10-CM | POA: Diagnosis not present

## 2015-09-14 ENCOUNTER — Other Ambulatory Visit: Payer: Self-pay

## 2015-09-14 NOTE — Patient Outreach (Addendum)
[facility]) Care Management  09/14/2015  SYDNEI LAMONDA 09-May-1928 [id number]   Assessment: referral received per telephonic care coordinator. RNCM called to schedule home visit. Member agreed to answer some questions telephonically. Member reports decrease in activity over the years. Member states she lives in daughter/son in [place] home. Member reports seeing her primary care provider yesterday regarding itching and reports she was prescribed cream for allergy itching. Member also reports she has gotten back into the church and is going to counseling for some anxiety/depression. Member reports A1C around 6.  Member reports she saw her doctor regarding itching and reports it is allergies. Has been given medication to help relieve itching.  Plan: home visit March to complete assessment of care management needs.  Thea Silversmith, RN, MSN, [place] Coordinator Cell: [phone]

## 2015-09-24 DIAGNOSIS — F4321 Adjustment disorder with depressed mood: Secondary | ICD-10-CM | POA: Diagnosis not present

## 2015-09-24 DIAGNOSIS — F321 Major depressive disorder, single episode, moderate: Secondary | ICD-10-CM | POA: Diagnosis not present

## 2015-09-25 ENCOUNTER — Other Ambulatory Visit: Payer: Self-pay

## 2015-09-25 VITALS — BP 108/54 | HR 71 | Resp 18 | Ht 66.0 in | Wt 166.0 lb

## 2015-09-25 DIAGNOSIS — Z748 Other problems related to care provider dependency: Secondary | ICD-10-CM

## 2015-09-25 NOTE — Patient Outreach (Addendum)
[facility]) Care Management  [place]  09/25/2015   Emily Waller [birth year] [id number]  Subjective: member reports she has been preparing her medications, but would rather someone assist her with managing her medications. Member also states, "I am depressed"  Objective: BP 108/54 mmHg  Pulse 71  Resp 18  Ht 1.676 m (5\' 6" )  Wt 166 lb (75.297 kg)  BMI 26.81 kg/m2  SpO2 97% Lungs clear, respiration even unlabored, several red splotches noted to upper back not raised. Heart rate regular.  Current Medications:  Outpatient Encounter Prescriptions as of 09/25/2015  Medication Sig  . allopurinol (ZYLOPRIM) 300 MG tablet Take 300 mg by mouth daily.  Marland Kitchen amLODipine (NORVASC) 5 MG tablet Take 5 mg by mouth daily.  Marland Kitchen atorvastatin (LIPITOR) 10 MG tablet Take 10 mg by mouth daily.  . Coenzyme Q10-Fish Oil-Vit E (CO-Q 10 OMEGA-3 FISH OIL) CAPS Take 1 capsule by mouth daily.  . Ergocalciferol (VITAMIN D2 PO) Take 1 capsule by mouth once a week.  . ezetimibe (ZETIA) 10 MG tablet Take 10 mg by mouth daily.  Marland Kitchen lisinopril-hydrochlorothiazide (PRINZIDE,ZESTORETIC) 20-25 MG tablet Take 1 tablet by mouth daily.  Marland Kitchen loperamide (IMODIUM) 2 MG capsule Take 2 mg by mouth daily.  . mirabegron ER (MYRBETRIQ) 50 MG TB24 tablet Take 50 mg by mouth daily.  Marland Kitchen oxybutynin (DITROPAN-XL) 10 MG 24 hr tablet Take 10 mg by mouth daily.  . pantoprazole (PROTONIX) 40 MG tablet Take 40 mg by mouth daily.  Vladimir Faster Glycol-Propyl Glycol (SYSTANE ULTRA OP) Apply 1 drop to eye 2 (two) times daily.  . sertraline (ZOLOFT) 100 MG tablet Take 100 mg by mouth daily.  Marland Kitchen triamcinolone cream (KENALOG) 0.1 % Apply 1 application topically as needed.   No facility-administered encounter medications on file as of 09/25/2015.    Functional Status:  In your present state of health, do you have any difficulty performing the following activities: 09/25/2015  Hearing? Y  Vision? N  Difficulty concentrating or  making decisions? N  Walking or climbing stairs? Y  Dressing or bathing? N  Doing errands, shopping? Y  Preparing Food and eating ? N  Using the Toilet? N  In the past six months, have you accidently leaked urine? Y  Do you have problems with loss of bowel control? Y  Managing your Medications? Y  Managing your Finances? N  Housekeeping or managing your Housekeeping? N    Fall/Depression Screening: PHQ 2/9 Scores 09/25/2015 09/12/2015  PHQ - 2 Score 1 2  PHQ- 9 Score 11 3   Fall Risk  09/25/2015 09/12/2015  Falls in the past year? No No     Assessment: 82 year old referral per primary care re: Member had depression and health issues". Member is very talkative and frequently answers questions with a story. Home assessment completed. Member lives in basement portion of her daughter's and son-in-law's home that they have set up for her. Member has unlimited access to amenities upstairs. Member is careful about going up and down the stairs, holding onto the rails, RNCM reinforced limiting throw rugs and adequate lighting. There is an exit to outside from the basement.    History of depression-Member PHQ 2 SCORE 1 PHQ 9 score 11- Member acknowledges depression, is on medication and member reprots she has been going to counseling, but states she does not think it is benefiting her, stating she plans to go to one more session and then she will stop. RNCM discussed social work  assistance and member initially declines, but agrees for social worker to discuss other options that may be available to her. Member states she has become more active in the church.   Transportation needs. 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 Member would like to know what her options are for transportation.  Member is interested in increasing socialization and expresses how she used to be very active, running. RNCM  discussed the Providers Referral Exercise Program. Brochure provided.   Re: Medications-member is knowledgeable about her medications, But states she would like someone else to fill her pill box. Member is agreeable to [employer] Program referral. RNCM called and left member's contact number.  History of diabetes-Member does not check her blood sugar. RNCM taught member how to use her glucose member using teach-back method. Blood sugar 104 today. Teaching also completed on how to use blood pressure machine. [facility] calender organizer provided for member to record readings. Member also request RNCM demonstrate use of automatic blood pressure cuff. RNCM provided education on blood pressure cuff use. RNCM encouraged member to record readings in [facility] calender/organizer and take to office visits.   Plan: home visit in 3-4 weeks. THN CM Care Plan Problem One        Most Recent Value   Care Plan Problem One  ithcing   Role Documenting the Problem One  Care Management Coordinator   Care Plan for Problem One  Active   THN CM Short Term Goal #1 (0-30 days)  member will express decrease/relief of itching within the next 30 days.   THN CM Short Term Goal #1 Start Date  09/14/15   Interventions for Short Term Goal #1  reinforced upcoming appointment tomorrow.    THN CM Care Plan Problem Two        Most Recent Value   Care Plan Problem Two  knowledge defict related to diabetes    Role Documenting the Problem Two  Care Management Coordinator   Care Plan for Problem Two  Active   Interventions for Problem Two Long Term Goal   instruction with return demonstration on how to use glucose meter., encouraged member to check blood sugar   THN Long Term Goal (31-90) days  member will verbalize at least three strategies for managing blood sugar.   THN Long Term Goal Start Date  09/25/15   THN CM Short Term Goal #1 (0-30 days)  member will check blood sugar within the next 30 days.   THN CM Short Term  Goal #1 Start Date  09/25/15   Interventions for Short Term Goal #2   teaching on how to use glucometer    [facility] CM Care Plan Problem Three        Most Recent Value   Care Plan Problem Three  community resource needs regarding medications   Role Documenting the Problem Three  Care Management Coordinator   Care Plan for Problem Three  Active   THN CM Short Term Goal #1 (0-30 days)  member will verbalize contact and/or intiation of referred services   [facility] CM Short Term Goal #1 Start Date  09/25/15   Interventions for Short Term Goal #1  referral made to community health response program.      Thea Silversmith, RN, MSN, [place] Coordinator Cell: [phone]

## 2015-09-27 ENCOUNTER — Other Ambulatory Visit: Payer: Self-pay | Admitting: Licensed Clinical Social Worker

## 2015-09-27 NOTE — Patient Outreach (Signed)
[facility]) Care Management  09/27/2015  Emily Waller 11-08-1927 [id number]   Assessment-CSW completed outreach to patient after receiving referral from [facility]. RNCM states that patient is in need of transportation and socialization resources. CSW unable to reach patient but left a HIPPA compliant voice message.  Plan-CSW will complete next outreach on 10/02/15.  Eula Fried, BSW, MSW, [email or web address] Phone: [phone] Fax: [phone]

## 2015-09-27 NOTE — Patient Outreach (Signed)
[facility]) Care Management  09/27/2015  Emily Waller [birth year] [id number]   Assessment-CSW received call back from patient. HIPPA verifications provided. CSW introduced self, reason for call and of THN social work services. Patient is very pleasant during call. She states that she does not feel she needs social work assistance. CSW educated her on what services she can provide to her. Patient reports that she has completed three counseling sessions and that it was suggested by the counselor that sessions be terminated after her next session. Patient reports "I just don't think it was helping me. I think my church can help me more than counseling. I'm doing okay. There isn't much anyone can do." Patient denies needing mental healh, transportation and socialization resources. Patient reports that her family can transport her to medical appointments when needed or that she can use uber. CSW educated her on [employer] but she is not agreeable to referral. Patient denies social work assistance. However, she still wishes for nursing to be involved as she shares that she needs assistance with gaining someone to help fill pill boxes. Patient reminded that she can contact CSW if she changes her mind about needing social work assistance.  Plan-CSW will not open case as patient is not agreeable to referral. RNCM still involved. CSW will update RNCM of patient's decision and social work discharge.  Eula Fried, BSW, MSW, [email or web address] Phone: [phone] Fax: [phone]

## 2015-10-01 ENCOUNTER — Other Ambulatory Visit: Payer: Self-pay | Admitting: Pharmacist

## 2015-10-01 NOTE — Patient Outreach (Signed)
[facility]) Care Management  10/01/2015  VIOLET WHITINGER 20-Aug-1927 [id number]   [name] is a 82yo who was referred to [place] for medication management.  I made outreach call to patient.  Initial home visit scheduled for 10/03/15 at 10:00 AM.     Elisabeth Most, Pharm.D. Pharmacy Resident [place] [phone]

## 2015-10-03 ENCOUNTER — Other Ambulatory Visit: Payer: Self-pay | Admitting: Pharmacist

## 2015-10-03 ENCOUNTER — Encounter: Payer: Self-pay | Admitting: Pharmacist

## 2015-10-03 NOTE — Patient Outreach (Signed)
Emily Waller) Care Management  [place]   10/03/2015  Emily Waller May 16, 1928 [id number]  Subjective: Emily Waller is a 82yo who was referred to [place] for medication management.  I made initial home visit today.  Patient lives in a basement apartment of her daughter's home.  Patient currently uses a weekly pill planner and fills her own pill box.  I reviewed patient's medication planner and all medications were appropriately placed in pill box.  Patient denies any difficulty in filling her pill planner but is wanting some assistance every few weeks to assist in filling her pill planner and monitoring her blood pressure.    Objective:   Current Medications: Current Outpatient Prescriptions  Medication Sig Dispense Refill  . allopurinol (ZYLOPRIM) 300 MG tablet Take 300 mg by mouth daily.    Marland Kitchen amLODipine (NORVASC) 5 MG tablet Take 5 mg by mouth daily.    Marland Kitchen atorvastatin (LIPITOR) 10 MG tablet Take 10 mg by mouth daily.    . Coenzyme Q10-Fish Oil-Vit E (CO-Q 10 OMEGA-3 FISH OIL) CAPS Take 1 capsule by mouth daily.    . Ergocalciferol (VITAMIN D2 PO) Take 1 capsule by mouth once a week.    . ezetimibe (ZETIA) 10 MG tablet Take 10 mg by mouth daily.    Marland Kitchen lisinopril-hydrochlorothiazide (PRINZIDE,ZESTORETIC) 20-25 MG tablet Take 1 tablet by mouth daily.    Marland Kitchen loperamide (IMODIUM) 2 MG capsule Take 2 mg by mouth every other day.     . mirabegron ER (MYRBETRIQ) 50 MG TB24 tablet Take 50 mg by mouth daily.    Marland Kitchen oxybutynin (DITROPAN-XL) 10 MG 24 hr tablet Take 10 mg by mouth daily.    . pantoprazole (PROTONIX) 40 MG tablet Take 40 mg by mouth daily.    Vladimir Faster Glycol-Propyl Glycol (SYSTANE ULTRA OP) Apply 1 drop to eye 2 (two) times daily.    . sertraline (ZOLOFT) 100 MG tablet Take 100 mg by mouth daily.    Marland Kitchen triamcinolone cream (KENALOG) 0.1 % Apply 1 application topically as needed.     No current facility-administered medications for this visit.    Functional Status: In your present state of health, do you have any difficulty performing the following activities: 09/25/2015  Hearing? Y  Vision? N  Difficulty concentrating or making decisions? N  Walking or climbing stairs? Y  Dressing or bathing? N  Doing errands, shopping? Y  Preparing Food and eating ? N  Using the Toilet? N  In the past six months, have you accidently leaked urine? Y  Do you have problems with loss of bowel control? Y  Managing your Medications? Y  Managing your Finances? N  Housekeeping or managing your Housekeeping? N   Fall/Depression Screening: PHQ 2/9 Scores 09/25/2015 09/12/2015  PHQ - 2 Score 1 2  PHQ- 9 Score 11 3    Assessment: Drugs sorted by system:  Neurologic/Psychologic:  sertraline  Cardiovascular: amlodipine, atorvastatin, ezetimibe, lisinopril-hydrochlorothiazide, Omega Q Plus  Gastrointestinal: loperamide, pantoprazole  Renal: mirabegron, oxybutynin  Topical: Sustane eye drops, triamcinolone cream  Vitamins/Minerals: ergocalciferol  Miscellaneous:  allopurinol  Duplications in therapy: oxybutynin and mirabegron for overactive bladder - evidence supports use of combination therapy in patients who do not achieve response with single agent. Patient confirms she tried single agents initially without adequate response. Gaps in therapy: aspirin (patient had PCI in 2009) Medications to avoid in the elderly: pantoprazole (risk of Clostridium difficile infection and bone loss and fractures) Drug interactions: none noted  Medication management:  I discussed the use of Community Health Response Program [facility]) who has a nurse that can come to the home to assist in pill box fills and will check patient's vital signs.  Patient is agreeable to [facility] services.  I complete a referral to [facility].    Patient had several bottles of expired mediations in the medicine cabinet (oxybutynin, allopurinol, lisinopril-hydrochlorothiazide).  She had current  bottles of allopurinol and lisinopril-hydrchlorothiazide, but needs a refill of oxybutynin.  Patient made a call to her PCP to request a prescription refill of oxybutynin.  Patient also had bottle of lovastatin which has been discontinued.  Assisted patient in properly disposing of expired/discontinued medications. Patient kept bottle of oxybutynin which expired in October 2016 until prescription refil is approved.   Plan: 1.  Medication review:  I will send a letter to her provider requesting pantoprazole be switched to a H2-blocker and requesting initiation of aspirin if clinically indicated.   2.  Patient will receive a phone call from [facility] with [facility] within the next two weeks to set initial home visit.  Patient will continue to fill her pill planner until initial [place] visit.  Patient confirms she feels comfortable filling her pill planner at this time.   3.  I will follow up with patient in two weeks.    [facility] CM Care Plan Problem One        Waller Recent Value   Care Plan Problem One  Medication management   Role Documenting the Problem One  Clinical Pharmacist   Care Plan for Problem One  Active   THN CM Short Term Goal #1 (0-30 days)  Patient will enroll in [facility]) within the next 30 days.    THN CM Short Term Goal #1 Start Date  10/04/15   Interventions for Short Term Goal #1  Patient is interested in assistance with pill box fills.  Discussed use of [facility] nurse for pill box fills and patient was agreeable.  I spoke to [place] at [facility] and completed referral.  Claiborne Billings reports patient will receive a call from the nurse, Emily Waller, within two weeks.      Emily Waller, Pharm.D. Pharmacy Resident [place] [phone]

## 2015-10-04 ENCOUNTER — Encounter: Payer: Self-pay | Admitting: Pharmacist

## 2015-10-09 DIAGNOSIS — F321 Major depressive disorder, single episode, moderate: Secondary | ICD-10-CM | POA: Diagnosis not present

## 2015-10-09 DIAGNOSIS — F4321 Adjustment disorder with depressed mood: Secondary | ICD-10-CM | POA: Diagnosis not present

## 2015-10-17 ENCOUNTER — Other Ambulatory Visit: Payer: Self-pay | Admitting: Pharmacist

## 2015-10-17 NOTE — Patient Outreach (Signed)
[facility] [facility]) Care Management  10/17/2015  Emily Waller May 13, 1928 [id number]   [name] is a 82yo who was referred to [place] for medication management.  I made a follow up call to patient to check on the initiation of [facility]) services for pill box fills.  There was no answer.  I left a HIPAA compliant voicemail for patient to return my call.  I will make a second outreach call within one week if patient does not return my call.     Elisabeth Most, Pharm.D. Pharmacy Resident [place] [phone]

## 2015-10-18 ENCOUNTER — Other Ambulatory Visit: Payer: Self-pay

## 2015-10-18 NOTE — Patient Outreach (Signed)
[facility]) Care Management  10/18/2015  Emily Waller 1928/04/30 [id number]  RNCM called regarding follow up home visit. No answer. HIPPA compliant message left.  Plan: await return phone call and follow up next week if no return call.  Thea Silversmith, RN, MSN, [place] Coordinator Cell: [phone]

## 2015-10-22 ENCOUNTER — Other Ambulatory Visit: Payer: Self-pay

## 2015-10-22 ENCOUNTER — Ambulatory Visit: Payer: Medicare Other

## 2015-10-22 ENCOUNTER — Other Ambulatory Visit: Payer: Self-pay | Admitting: Pharmacist

## 2015-10-22 NOTE — Patient Outreach (Signed)
[facility] [facility]) Care Management  10/22/2015  Emily Waller Dec 11, 1927 [id number]   Emily Waller is a 82yo who was referred to [place] for medication management.  I made a second outreach call to patient to follow up on initiation of [facility]) services for pill box fills.  There was no answer.  I left a HIPAA compliant voicemail for patient to return my call.  I will make a third outreach call within one week if patient does not return my call.    Elisabeth Most, Pharm.D. Pharmacy Resident [place] [phone]

## 2015-10-22 NOTE — Patient Outreach (Signed)
[facility]) Care Management  10/22/2015  ASPEN SHEWMAKE 1928/04/11 [id number]  Care Coordination-RNCM called to schedule home visit. No answer. HIPPA compliant message left.  Plan: Await return call. Follow up call next week.   Thea Silversmith, RN, MSN, [place] Coordinator Cell: [phone]

## 2015-10-24 ENCOUNTER — Other Ambulatory Visit: Payer: Self-pay | Admitting: Pharmacist

## 2015-10-24 ENCOUNTER — Encounter: Payer: Self-pay | Admitting: Pharmacist

## 2015-10-24 NOTE — Patient Outreach (Signed)
[facility]) Care Management  10/24/2015  NIHARIKA NOVAKOWSKI October 22, 1927 [id number]   Latitia Cimini is a 82yo female who was referred to [place] for medication management.  I made a third outreach call to patient to follow up on initiation of [facility]) services for pill box fills.  There was no answer.  I left a HIPAA compliant voicemail for patient to return my phone call.  Per protocol, I will mail an unsuccessful outreach letter at this time.  I will review case in 2 weeks for patient response and close case if unable to engage.     Elisabeth Most, Pharm.D. Pharmacy Resident [place] [phone]

## 2015-10-29 ENCOUNTER — Other Ambulatory Visit: Payer: Self-pay | Admitting: Pharmacist

## 2015-10-29 ENCOUNTER — Other Ambulatory Visit: Payer: Self-pay

## 2015-10-29 NOTE — Patient Outreach (Signed)
[facility]) Care Management  [place]   10/29/2015  ANBERLIN DIEZ Jun 25, 1928 [id number]  Subjective: Briselda Naval is an 82yo who was referred to [place] for medication management.  I received a return phone call from patient today.  Patient reports she is now enrolled in [facility]) for pill box fills and reports Fredrich Birks has made one home visit already and is scheduled to make another visit today.    Objective:   Encounter Medications: Outpatient Encounter Prescriptions as of 10/29/2015  Medication Sig  . allopurinol (ZYLOPRIM) 300 MG tablet Take 300 mg by mouth daily.  Marland Kitchen amLODipine (NORVASC) 5 MG tablet Take 5 mg by mouth daily.  Marland Kitchen atorvastatin (LIPITOR) 10 MG tablet Take 10 mg by mouth daily.  . Coenzyme Q10-Fish Oil-Vit E (CO-Q 10 OMEGA-3 FISH OIL) CAPS Take 1 capsule by mouth daily.  . Ergocalciferol (VITAMIN D2 PO) Take 1 capsule by mouth once a week.  . ezetimibe (ZETIA) 10 MG tablet Take 10 mg by mouth daily.  Marland Kitchen lisinopril-hydrochlorothiazide (PRINZIDE,ZESTORETIC) 20-25 MG tablet Take 1 tablet by mouth daily.  Marland Kitchen loperamide (IMODIUM) 2 MG capsule Take 2 mg by mouth every other day.   . mirabegron ER (MYRBETRIQ) 50 MG TB24 tablet Take 50 mg by mouth daily.  Marland Kitchen oxybutynin (DITROPAN-XL) 10 MG 24 hr tablet Take 10 mg by mouth daily.  . pantoprazole (PROTONIX) 40 MG tablet Take 40 mg by mouth daily.  Vladimir Faster Glycol-Propyl Glycol (SYSTANE ULTRA OP) Apply 1 drop to eye 2 (two) times daily.  . sertraline (ZOLOFT) 100 MG tablet Take 100 mg by mouth daily.  Marland Kitchen triamcinolone cream (KENALOG) 0.1 % Apply 1 application topically as needed.   No facility-administered encounter medications on file as of 10/29/2015.   Functional Status: In your present state of health, do you have any difficulty performing the following activities: 09/25/2015  Hearing? Y  Vision? N  Difficulty concentrating or making decisions? N   Walking or climbing stairs? Y  Dressing or bathing? N  Doing errands, shopping? Y  Preparing Food and eating ? N  Using the Toilet? N  In the past six months, have you accidently leaked urine? Y  Do you have problems with loss of bowel control? Y  Managing your Medications? Y  Managing your Finances? N  Housekeeping or managing your Housekeeping? N   Fall/Depression Screening: PHQ 2/9 Scores 09/25/2015 09/12/2015  PHQ - 2 Score 1 2  PHQ- 9 Score 11 3    Assessment: Medication management:  Patient confirms she is now enrolled in [facility] for pill box fills.  Patient reports the nurse, Fredrich Birks, has made one home visit already and is scheduled to make another visit today.  Patient confirms she received the refill of oxybutynin from mail order pharmacy that was requested during home visit.  Patient denies any further pharmacy needs at this time.    Plan: Will close pharmacy program as goals have been met and no further pharmacy needs identified.  I have updated [place].    [facility] CM Care Plan Problem One        Most Recent Value   Care Plan Problem One  Medication management   Role Documenting the Problem One  Clinical Pharmacist   Care Plan for Problem One  Not Active   [facility] CM Short Term Goal #1 (0-30 days)  Patient will enroll in [facility]) within the next 30  days.    [facility] CM Short Term Goal #1 Start Date  10/04/15   [facility] CM Short Term Goal #1 Met Date  10/29/15   Interventions for Short Term Goal #1  Patient confirms she is now enrolled in [facility]) for pill box fills.  Patient reports nurse Fredrich Birks has made one visit and is scheduled to make second visit today.       Elisabeth Most, Pharm.D. Pharmacy Resident [place] [phone]

## 2015-10-29 NOTE — Patient Outreach (Signed)
[facility]) Care Management  10/29/2015  Emily Waller 02-14-1928 [id number]  RNCM has been trying to reach member. RNCM called to discuss additional care management needs/schedule follow up home visit. No answer. HIPPA compliant message left. This is RNCM's third call to reach member.   Plan: per policy and procedure send outreach letter.  Thea Silversmith, RN, MSN, [place] Coordinator Cell: [phone]

## 2015-11-01 ENCOUNTER — Other Ambulatory Visit: Payer: Self-pay

## 2015-11-01 NOTE — Patient Outreach (Signed)
[facility]) Care Management  11/01/2015  Emily Waller Dec 22, 1927 [id number]  Return call to member. No answer. HIPPA compliant message left.  Plan: await return call and follow up in 1-2 weeks if no return call.  Thea Silversmith, RN, MSN, [place] Coordinator Cell: [phone]

## 2015-11-14 DIAGNOSIS — F411 Generalized anxiety disorder: Secondary | ICD-10-CM | POA: Diagnosis not present

## 2015-11-14 DIAGNOSIS — N8110 Cystocele, unspecified: Secondary | ICD-10-CM | POA: Diagnosis not present

## 2015-11-14 DIAGNOSIS — R197 Diarrhea, unspecified: Secondary | ICD-10-CM | POA: Diagnosis not present

## 2015-11-14 DIAGNOSIS — L299 Pruritus, unspecified: Secondary | ICD-10-CM | POA: Diagnosis not present

## 2015-11-14 DIAGNOSIS — L84 Corns and callosities: Secondary | ICD-10-CM | POA: Diagnosis not present

## 2015-11-14 DIAGNOSIS — G629 Polyneuropathy, unspecified: Secondary | ICD-10-CM | POA: Diagnosis not present

## 2015-11-15 ENCOUNTER — Other Ambulatory Visit: Payer: Self-pay

## 2015-11-15 NOTE — Patient Outreach (Addendum)
[facility]) Care Management  11/15/2015  GRINDL TAWES 1928/05/31 [id number]   Member reports seeing primary care on yesterday. Member states she is doing ok, has had some issues with irritable bowel syndrome, but has restarted antidiarrheals and will follow up with her physician at the digestive clinic.   Member states she is going out more and has started going to the [facility].  Medications managed by [place] program.   History of diet controlled diabetes. Member is not on oral glycemic medication, she knows how to check blood sugar. Does not want any additional education.   No additional care management needs identified.  Reinforced 24 hour nurse advice line. Member acknowledges that she has RNCM's contact information.  Close case.  Thea Silversmith, RN, MSN, [place] Coordinator Cell: [phone]

## 2016-01-24 DIAGNOSIS — H532 Diplopia: Secondary | ICD-10-CM

## 2016-01-24 DIAGNOSIS — D3132 Benign neoplasm of left choroid: Secondary | ICD-10-CM

## 2016-01-24 DIAGNOSIS — H26492 Other secondary cataract, left eye: Secondary | ICD-10-CM

## 2016-01-24 DIAGNOSIS — H35371 Puckering of macula, right eye: Secondary | ICD-10-CM | POA: Insufficient documentation

## 2016-01-24 DIAGNOSIS — Z8673 Personal history of transient ischemic attack (TIA), and cerebral infarction without residual deficits: Secondary | ICD-10-CM

## 2016-01-24 DIAGNOSIS — Z961 Presence of intraocular lens: Secondary | ICD-10-CM | POA: Insufficient documentation

## 2016-01-24 DIAGNOSIS — H04123 Dry eye syndrome of bilateral lacrimal glands: Secondary | ICD-10-CM

## 2016-01-24 HISTORY — DX: Puckering of macula, right eye: H35.371

## 2016-01-24 HISTORY — DX: Dry eye syndrome of bilateral lacrimal glands: H04.123

## 2016-01-24 HISTORY — DX: Personal history of transient ischemic attack (TIA), and cerebral infarction without residual deficits: Z86.73

## 2016-01-24 HISTORY — DX: Other secondary cataract, left eye: H26.492

## 2016-01-24 HISTORY — DX: Benign neoplasm of left choroid: D31.32

## 2016-01-24 HISTORY — DX: Presence of intraocular lens: Z96.1

## 2016-01-24 HISTORY — DX: Diplopia: H53.2

## 2016-01-29 DIAGNOSIS — E119 Type 2 diabetes mellitus without complications: Secondary | ICD-10-CM | POA: Diagnosis not present

## 2016-01-29 DIAGNOSIS — Z8679 Personal history of other diseases of the circulatory system: Secondary | ICD-10-CM | POA: Diagnosis not present

## 2016-01-29 DIAGNOSIS — Z961 Presence of intraocular lens: Secondary | ICD-10-CM | POA: Diagnosis not present

## 2016-01-29 DIAGNOSIS — H532 Diplopia: Secondary | ICD-10-CM | POA: Diagnosis not present

## 2016-01-29 DIAGNOSIS — H26492 Other secondary cataract, left eye: Secondary | ICD-10-CM | POA: Diagnosis not present

## 2016-01-29 DIAGNOSIS — D3132 Benign neoplasm of left choroid: Secondary | ICD-10-CM | POA: Diagnosis not present

## 2016-01-29 DIAGNOSIS — M1990 Unspecified osteoarthritis, unspecified site: Secondary | ICD-10-CM | POA: Diagnosis not present

## 2016-01-29 DIAGNOSIS — IMO0002 Reserved for concepts with insufficient information to code with codable children: Secondary | ICD-10-CM | POA: Insufficient documentation

## 2016-01-29 DIAGNOSIS — I10 Essential (primary) hypertension: Secondary | ICD-10-CM | POA: Diagnosis not present

## 2016-01-29 DIAGNOSIS — H35371 Puckering of macula, right eye: Secondary | ICD-10-CM | POA: Diagnosis not present

## 2016-01-29 DIAGNOSIS — Z8673 Personal history of transient ischemic attack (TIA), and cerebral infarction without residual deficits: Secondary | ICD-10-CM | POA: Diagnosis not present

## 2016-01-29 DIAGNOSIS — H04123 Dry eye syndrome of bilateral lacrimal glands: Secondary | ICD-10-CM | POA: Diagnosis not present

## 2016-01-29 DIAGNOSIS — R32 Unspecified urinary incontinence: Secondary | ICD-10-CM | POA: Insufficient documentation

## 2016-01-29 DIAGNOSIS — K635 Polyp of colon: Secondary | ICD-10-CM

## 2016-01-29 DIAGNOSIS — H43812 Vitreous degeneration, left eye: Secondary | ICD-10-CM | POA: Diagnosis not present

## 2016-01-29 DIAGNOSIS — K219 Gastro-esophageal reflux disease without esophagitis: Secondary | ICD-10-CM | POA: Insufficient documentation

## 2016-01-29 HISTORY — DX: Polyp of colon: K63.5

## 2016-02-15 DIAGNOSIS — M109 Gout, unspecified: Secondary | ICD-10-CM | POA: Diagnosis not present

## 2016-02-15 DIAGNOSIS — F329 Major depressive disorder, single episode, unspecified: Secondary | ICD-10-CM | POA: Diagnosis not present

## 2016-02-15 DIAGNOSIS — E559 Vitamin D deficiency, unspecified: Secondary | ICD-10-CM | POA: Diagnosis not present

## 2016-02-15 DIAGNOSIS — I10 Essential (primary) hypertension: Secondary | ICD-10-CM | POA: Diagnosis not present

## 2016-02-15 DIAGNOSIS — N183 Chronic kidney disease, stage 3 (moderate): Secondary | ICD-10-CM | POA: Diagnosis not present

## 2016-02-15 DIAGNOSIS — K219 Gastro-esophageal reflux disease without esophagitis: Secondary | ICD-10-CM | POA: Diagnosis not present

## 2016-02-15 DIAGNOSIS — I2510 Atherosclerotic heart disease of native coronary artery without angina pectoris: Secondary | ICD-10-CM | POA: Diagnosis not present

## 2016-02-15 DIAGNOSIS — E1122 Type 2 diabetes mellitus with diabetic chronic kidney disease: Secondary | ICD-10-CM | POA: Diagnosis not present

## 2016-02-15 DIAGNOSIS — E785 Hyperlipidemia, unspecified: Secondary | ICD-10-CM | POA: Diagnosis not present

## 2016-02-18 DIAGNOSIS — N3944 Nocturnal enuresis: Secondary | ICD-10-CM | POA: Diagnosis not present

## 2016-02-18 DIAGNOSIS — N3941 Urge incontinence: Secondary | ICD-10-CM | POA: Diagnosis not present

## 2016-03-17 DIAGNOSIS — R42 Dizziness and giddiness: Secondary | ICD-10-CM | POA: Diagnosis not present

## 2016-03-17 DIAGNOSIS — R8290 Unspecified abnormal findings in urine: Secondary | ICD-10-CM | POA: Diagnosis not present

## 2016-03-17 DIAGNOSIS — N183 Chronic kidney disease, stage 3 (moderate): Secondary | ICD-10-CM | POA: Diagnosis not present

## 2016-03-17 DIAGNOSIS — R5383 Other fatigue: Secondary | ICD-10-CM | POA: Diagnosis not present

## 2016-03-17 DIAGNOSIS — F329 Major depressive disorder, single episode, unspecified: Secondary | ICD-10-CM | POA: Diagnosis not present

## 2016-03-17 DIAGNOSIS — F411 Generalized anxiety disorder: Secondary | ICD-10-CM | POA: Diagnosis not present

## 2016-03-17 DIAGNOSIS — G629 Polyneuropathy, unspecified: Secondary | ICD-10-CM | POA: Diagnosis not present

## 2016-03-17 DIAGNOSIS — R32 Unspecified urinary incontinence: Secondary | ICD-10-CM | POA: Diagnosis not present

## 2016-03-17 DIAGNOSIS — E1122 Type 2 diabetes mellitus with diabetic chronic kidney disease: Secondary | ICD-10-CM | POA: Diagnosis not present

## 2016-04-30 DIAGNOSIS — M109 Gout, unspecified: Secondary | ICD-10-CM | POA: Diagnosis not present

## 2016-04-30 DIAGNOSIS — Z23 Encounter for immunization: Secondary | ICD-10-CM | POA: Diagnosis not present

## 2016-04-30 DIAGNOSIS — M79674 Pain in right toe(s): Secondary | ICD-10-CM | POA: Diagnosis not present

## 2016-04-30 DIAGNOSIS — H532 Diplopia: Secondary | ICD-10-CM | POA: Diagnosis not present

## 2016-04-30 DIAGNOSIS — R3989 Other symptoms and signs involving the genitourinary system: Secondary | ICD-10-CM | POA: Diagnosis not present

## 2016-04-30 DIAGNOSIS — F5103 Paradoxical insomnia: Secondary | ICD-10-CM | POA: Diagnosis not present

## 2016-08-18 DIAGNOSIS — N183 Chronic kidney disease, stage 3 (moderate): Secondary | ICD-10-CM | POA: Diagnosis not present

## 2016-08-18 DIAGNOSIS — I10 Essential (primary) hypertension: Secondary | ICD-10-CM | POA: Diagnosis not present

## 2016-08-18 DIAGNOSIS — E1122 Type 2 diabetes mellitus with diabetic chronic kidney disease: Secondary | ICD-10-CM | POA: Diagnosis not present

## 2016-08-18 DIAGNOSIS — I2510 Atherosclerotic heart disease of native coronary artery without angina pectoris: Secondary | ICD-10-CM | POA: Diagnosis not present

## 2016-08-18 DIAGNOSIS — E785 Hyperlipidemia, unspecified: Secondary | ICD-10-CM | POA: Diagnosis not present

## 2016-08-18 DIAGNOSIS — F329 Major depressive disorder, single episode, unspecified: Secondary | ICD-10-CM | POA: Diagnosis not present

## 2016-08-18 DIAGNOSIS — K219 Gastro-esophageal reflux disease without esophagitis: Secondary | ICD-10-CM | POA: Diagnosis not present

## 2016-08-18 DIAGNOSIS — M109 Gout, unspecified: Secondary | ICD-10-CM | POA: Diagnosis not present

## 2016-11-10 ENCOUNTER — Telehealth: Payer: Self-pay | Admitting: Cardiovascular Disease

## 2016-11-10 NOTE — Telephone Encounter (Signed)
Received records from [place] for appointment on 11/12/16 with Dr Oval Linsey.  Records put with Dr Blenda Mounts schedule for 11/12/16. lp

## 2016-11-12 ENCOUNTER — Encounter: Payer: Self-pay | Admitting: Cardiovascular Disease

## 2016-11-12 ENCOUNTER — Ambulatory Visit (INDEPENDENT_AMBULATORY_CARE_PROVIDER_SITE_OTHER): Payer: Medicare Other | Admitting: Cardiovascular Disease

## 2016-11-12 VITALS — BP 126/78 | HR 75 | Ht 67.0 in | Wt 162.0 lb

## 2016-11-12 DIAGNOSIS — R5383 Other fatigue: Secondary | ICD-10-CM | POA: Diagnosis not present

## 2016-11-12 DIAGNOSIS — R0602 Shortness of breath: Secondary | ICD-10-CM

## 2016-11-12 DIAGNOSIS — E7800 Pure hypercholesterolemia, unspecified: Secondary | ICD-10-CM

## 2016-11-12 DIAGNOSIS — Z9861 Coronary angioplasty status: Secondary | ICD-10-CM | POA: Diagnosis not present

## 2016-11-12 DIAGNOSIS — Z955 Presence of coronary angioplasty implant and graft: Secondary | ICD-10-CM

## 2016-11-12 DIAGNOSIS — I10 Essential (primary) hypertension: Secondary | ICD-10-CM | POA: Diagnosis not present

## 2016-11-12 NOTE — Progress Notes (Signed)
Cardiology Office Note   Date:  11/12/2016   ID:  Emily Waller, DOB Oct 05, 1927, MRN [id number]  PCP:  Orpah Melter, MD  Cardiologist:   Skeet Latch, MD   Chief Complaint  Patient presents with  . New Patient (Initial Visit)    Pt states no Sx.       History of Present Illness: Emily Waller is a 83 y.o. female  with CAD s/p PCI, hypertension, hyperlipidemia, diabetes, CKD III and GERD who presents to establish care.  Emily Waller had a stent placed 10/2007 at [facility] in [place].  Her PCP noted an abnormality on her EKG and referred her to cardiology.  She denies any history of heart attack or chest pain.  Emily Waller last saw Dr. Olen Pel 07/2016 and was doing well.  Her main complaint is that she's been feeling very tired.  She has shortness of breath with exertion. She also reports symptoms of chest discomfort that she thinks her acid reflux. She typically notes that when laying down. She reports having a hard time catching her breath when walking up stairs or exerting herself.  She has not been exercising much due to pain in her hands and feet.  She has neuropathy and cramping. She endorses mild edema in both feet that improves with elevation of her legs.  She denies orthopnea or PND.    In addition to her topical mediations Emily Waller also takes OmegaQ plus tumeric and a probiotic healthy heart tablet that [employer].     Past Medical History:  Diagnosis Date  . Diabetes mellitus without complication ([place])   . Hypertension     No past surgical history on file.   Current Outpatient Prescriptions  Medication Sig Dispense Refill  . allopurinol (ZYLOPRIM) 300 MG tablet Take 300 mg by mouth daily.    Marland Kitchen amLODipine (NORVASC) 5 MG tablet Take 5 mg by mouth daily.    Marland Kitchen aspirin EC 81 MG tablet Take 81 mg by mouth daily.    Marland Kitchen atorvastatin (LIPITOR) 10 MG tablet Take 10 mg by mouth daily.    . Coenzyme Q10-Fish Oil-Vit E (CO-Q 10 OMEGA-3 FISH  OIL) CAPS Take 1 capsule by mouth daily.    . Ergocalciferol (VITAMIN D2 PO) Take 1 capsule by mouth once a week.    . ezetimibe (ZETIA) 10 MG tablet Take 10 mg by mouth daily.    Marland Kitchen lisinopril-hydrochlorothiazide (PRINZIDE,ZESTORETIC) 20-25 MG tablet Take 1 tablet by mouth daily.    Marland Kitchen loperamide (IMODIUM) 2 MG capsule Take 2 mg by mouth every other day.     . mirabegron ER (MYRBETRIQ) 50 MG TB24 tablet Take 50 mg by mouth daily.    Marland Kitchen oxybutynin (DITROPAN-XL) 10 MG 24 hr tablet Take 10 mg by mouth daily.    . pantoprazole (PROTONIX) 40 MG tablet Take 40 mg by mouth daily.    Vladimir Faster Glycol-Propyl Glycol (SYSTANE ULTRA OP) Apply 1 drop to eye 2 (two) times daily.    . sertraline (ZOLOFT) 100 MG tablet Take 100 mg by mouth daily.    Marland Kitchen triamcinolone cream (KENALOG) 0.1 % Apply 1 application topically as needed.     No current facility-administered medications for this visit.     Allergies:   Colcrys [colchicine] and Lovastatin    Social History:  The patient  reports that she quit smoking about 47 years ago. She has never used smokeless tobacco.   Family History:  The patient's family history  includes CAD in her maternal grandfather and maternal grandmother; Cancer in her father; Heart disease in her father; Hyperlipidemia in her mother; Hypertension in her mother; Lung cancer in her sister.    ROS:  Please see the history of present illness.   Otherwise, review of systems are positive for none.   All other systems are reviewed and negative.    PHYSICAL EXAM: VS:  BP 126/78   Pulse 75   Ht 5\' 7"  (1.702 m)   Wt 73.5 kg (162 lb)   BMI 25.37 kg/m  , BMI Body mass index is 25.37 kg/m. GENERAL:  Well appearing HEENT:  Pupils equal round and reactive, fundi not visualized, oral mucosa unremarkable NECK:  No jugular venous distention, waveform within normal limits, carotid upstroke brisk and symmetric, no bruits, no thyromegaly LYMPHATICS:  No cervical adenopathy LUNGS:  Clear to  auscultation bilaterally HEART:  RRR.  PMI not displaced or sustained,S1 and S2 within normal limits, no S3, no S4, no clicks, no rubs, no murmurs ABD:  Flat, positive bowel sounds normal in frequency in pitch, no bruits, no rebound, no guarding, no midline pulsatile mass, no hepatomegaly, no splenomegaly EXT:  2 plus pulses throughout, no edema, no cyanosis no clubbing SKIN:  No rashes no nodules NEURO:  Cranial nerves II through XII grossly intact, motor grossly intact throughout PSYCH:  Cognitively intact, oriented to person place and time   EKG:  EKG is ordered today. The ekg ordered today demonstrates sinus rhythm.  Rate 75 bpm.  PAC.   Recent Labs: No results found for requested labs within last 8760 hours.   03/17/16: WBC 8.8, hemoglobin 13.8, hematocrit 41.4, platelets 154 Sodium 138, potassium 4.4, BUN 31, creatinine 1.6 AST 17, ALT 16 TSH 1.49 Hemoglobin A1c 6.6% Total cholesterol 142, triglycerides 85, HDL 47, LDL 78  Lipid Panel No results found for: CHOL, TRIG, HDL, CHOLHDL, VLDL, LDLCALC, LDLDIRECT    Wt Readings from Last 3 Encounters:  11/12/16 73.5 kg (162 lb)  09/25/15 75.3 kg (166 lb)  09/12/15 72.6 kg (160 lb)      ASSESSMENT AND PLAN:  # CAD s/p PCI: # Exertional dyspnea:  Emily Waller has a history of CAD and now notes exertional dyspnea.  We will obtain a Lexiscan Myoview to assess for ischemia. She has not been taking aspirin. We will start aspirin 81 mg daily.  # Hypertension: Blood pressure was initially elevated but improved on repeat. Continue amlodipine, lisinopril, and hydrochlorothiazide.   # Hyperlipidemia:  Continue ezetimibe and atorvastatin.  LDL 78 02/2016.   Current medicines are reviewed at length with the patient today.  The patient does not have concerns regarding medicines.  The following changes have been made:  Start aspirin 81 mg daily  Labs/ tests ordered today include:   Orders Placed This Encounter  Procedures  .  Myocardial Perfusion Imaging  . EKG 12-Lead     Disposition:   FU with Andrej Spagnoli C. Oval Linsey, MD, [facility] in 1 year   This note was written with the assistance of speech recognition software.  Please excuse any transcriptional errors.  Signed, Tuyen Uncapher C. Oval Linsey, MD, [facility]  11/12/2016 5:21 PM    [place]

## 2016-11-12 NOTE — Patient Instructions (Signed)
Medication Instructions:  START ASPIRIN 81 MG DAILY   Labwork: none  Testing/Procedures: Your physician has requested that you have a lexiscan myoview. For further information please visit [email or web address]. Please follow instruction sheet, as given.   Follow-Up: Your physician wants you to follow-up in: [place] will receive a reminder letter in the mail two months in advance. If you don't receive a letter, please call our office to schedule the follow-up appointment.  If you need a refill on your cardiac medications before your next appointment, please call your pharmacy.

## 2016-11-13 DIAGNOSIS — L309 Dermatitis, unspecified: Secondary | ICD-10-CM | POA: Diagnosis not present

## 2016-11-13 DIAGNOSIS — D485 Neoplasm of uncertain behavior of skin: Secondary | ICD-10-CM | POA: Diagnosis not present

## 2016-11-13 DIAGNOSIS — C44319 Basal cell carcinoma of skin of other parts of face: Secondary | ICD-10-CM | POA: Diagnosis not present

## 2016-11-21 ENCOUNTER — Telehealth (HOSPITAL_COMMUNITY): Payer: Self-pay

## 2016-11-21 NOTE — Telephone Encounter (Signed)
Encounter complete. 

## 2016-11-25 ENCOUNTER — Inpatient Hospital Stay (HOSPITAL_COMMUNITY): Admission: RE | Admit: 2016-11-25 | Payer: Medicare Other | Source: Ambulatory Visit

## 2016-11-26 ENCOUNTER — Telehealth (HOSPITAL_COMMUNITY): Payer: Self-pay | Admitting: Cardiovascular Disease

## 2016-11-27 NOTE — Telephone Encounter (Signed)
Pt's daughter called me back in regards to the patient's Myoview appt. I asked her if this is something the patient wanted to proceed with and the daughter voiced that this wasn't. She stated that this was an optional procedure and the mother was very apprehensive about having this, so she preferred to opt out of this appt.

## 2016-12-22 DIAGNOSIS — L309 Dermatitis, unspecified: Secondary | ICD-10-CM | POA: Diagnosis not present

## 2016-12-22 DIAGNOSIS — C44319 Basal cell carcinoma of skin of other parts of face: Secondary | ICD-10-CM | POA: Diagnosis not present

## 2016-12-26 DIAGNOSIS — I10 Essential (primary) hypertension: Secondary | ICD-10-CM | POA: Diagnosis not present

## 2016-12-26 DIAGNOSIS — R220 Localized swelling, mass and lump, head: Secondary | ICD-10-CM | POA: Diagnosis not present

## 2017-01-08 DIAGNOSIS — C44319 Basal cell carcinoma of skin of other parts of face: Secondary | ICD-10-CM | POA: Diagnosis not present

## 2017-02-11 DIAGNOSIS — I2510 Atherosclerotic heart disease of native coronary artery without angina pectoris: Secondary | ICD-10-CM | POA: Diagnosis not present

## 2017-02-11 DIAGNOSIS — K279 Peptic ulcer, site unspecified, unspecified as acute or chronic, without hemorrhage or perforation: Secondary | ICD-10-CM | POA: Diagnosis not present

## 2017-02-11 DIAGNOSIS — I10 Essential (primary) hypertension: Secondary | ICD-10-CM | POA: Diagnosis not present

## 2017-02-11 DIAGNOSIS — M1990 Unspecified osteoarthritis, unspecified site: Secondary | ICD-10-CM | POA: Diagnosis not present

## 2017-02-11 DIAGNOSIS — R413 Other amnesia: Secondary | ICD-10-CM | POA: Diagnosis not present

## 2017-02-11 DIAGNOSIS — E1142 Type 2 diabetes mellitus with diabetic polyneuropathy: Secondary | ICD-10-CM | POA: Diagnosis not present

## 2017-02-11 DIAGNOSIS — M109 Gout, unspecified: Secondary | ICD-10-CM | POA: Diagnosis not present

## 2017-02-11 DIAGNOSIS — E1122 Type 2 diabetes mellitus with diabetic chronic kidney disease: Secondary | ICD-10-CM | POA: Diagnosis not present

## 2017-02-11 DIAGNOSIS — E785 Hyperlipidemia, unspecified: Secondary | ICD-10-CM | POA: Diagnosis not present

## 2017-02-11 DIAGNOSIS — F329 Major depressive disorder, single episode, unspecified: Secondary | ICD-10-CM | POA: Diagnosis not present

## 2017-02-11 DIAGNOSIS — N183 Chronic kidney disease, stage 3 (moderate): Secondary | ICD-10-CM | POA: Diagnosis not present

## 2017-02-11 DIAGNOSIS — Z0000 Encounter for general adult medical examination without abnormal findings: Secondary | ICD-10-CM | POA: Diagnosis not present

## 2017-03-05 DIAGNOSIS — H26492 Other secondary cataract, left eye: Secondary | ICD-10-CM | POA: Diagnosis not present

## 2017-03-05 DIAGNOSIS — Z8673 Personal history of transient ischemic attack (TIA), and cerebral infarction without residual deficits: Secondary | ICD-10-CM | POA: Diagnosis not present

## 2017-03-05 DIAGNOSIS — H35371 Puckering of macula, right eye: Secondary | ICD-10-CM | POA: Diagnosis not present

## 2017-03-05 DIAGNOSIS — H43812 Vitreous degeneration, left eye: Secondary | ICD-10-CM | POA: Diagnosis not present

## 2017-03-05 DIAGNOSIS — Z961 Presence of intraocular lens: Secondary | ICD-10-CM | POA: Diagnosis not present

## 2017-03-05 DIAGNOSIS — H532 Diplopia: Secondary | ICD-10-CM | POA: Diagnosis not present

## 2017-03-05 DIAGNOSIS — E119 Type 2 diabetes mellitus without complications: Secondary | ICD-10-CM | POA: Diagnosis not present

## 2017-03-05 DIAGNOSIS — D3132 Benign neoplasm of left choroid: Secondary | ICD-10-CM | POA: Diagnosis not present

## 2017-03-05 DIAGNOSIS — H04123 Dry eye syndrome of bilateral lacrimal glands: Secondary | ICD-10-CM | POA: Diagnosis not present

## 2017-04-13 DIAGNOSIS — F322 Major depressive disorder, single episode, severe without psychotic features: Secondary | ICD-10-CM | POA: Diagnosis not present

## 2017-05-29 DIAGNOSIS — R351 Nocturia: Secondary | ICD-10-CM | POA: Diagnosis not present

## 2017-05-29 DIAGNOSIS — N3941 Urge incontinence: Secondary | ICD-10-CM | POA: Diagnosis not present

## 2017-06-17 ENCOUNTER — Other Ambulatory Visit: Payer: Self-pay

## 2017-06-17 ENCOUNTER — Emergency Department (HOSPITAL_COMMUNITY): Payer: Medicare Other

## 2017-06-17 ENCOUNTER — Encounter (HOSPITAL_COMMUNITY): Payer: Self-pay

## 2017-06-17 ENCOUNTER — Emergency Department (HOSPITAL_COMMUNITY)
Admission: EM | Admit: 2017-06-17 | Discharge: 2017-06-18 | Disposition: A | Discharge status: Home / Self Care | Payer: Medicare Other | Attending: Emergency Medicine | Admitting: Emergency Medicine

## 2017-06-17 DIAGNOSIS — E119 Type 2 diabetes mellitus without complications: Secondary | ICD-10-CM | POA: Diagnosis not present

## 2017-06-17 DIAGNOSIS — S0990XA Unspecified injury of head, initial encounter: Secondary | ICD-10-CM | POA: Diagnosis not present

## 2017-06-17 DIAGNOSIS — Y999 Unspecified external cause status: Secondary | ICD-10-CM | POA: Insufficient documentation

## 2017-06-17 DIAGNOSIS — S42212A Unspecified displaced fracture of surgical neck of left humerus, initial encounter for closed fracture: Secondary | ICD-10-CM | POA: Diagnosis not present

## 2017-06-17 DIAGNOSIS — Y929 Unspecified place or not applicable: Secondary | ICD-10-CM | POA: Insufficient documentation

## 2017-06-17 DIAGNOSIS — W109XXA Fall (on) (from) unspecified stairs and steps, initial encounter: Secondary | ICD-10-CM | POA: Insufficient documentation

## 2017-06-17 DIAGNOSIS — S0181XA Laceration without foreign body of other part of head, initial encounter: Secondary | ICD-10-CM | POA: Diagnosis not present

## 2017-06-17 DIAGNOSIS — I10 Essential (primary) hypertension: Secondary | ICD-10-CM | POA: Diagnosis not present

## 2017-06-17 DIAGNOSIS — Z87891 Personal history of nicotine dependence: Secondary | ICD-10-CM | POA: Insufficient documentation

## 2017-06-17 DIAGNOSIS — Z79899 Other long term (current) drug therapy: Secondary | ICD-10-CM | POA: Diagnosis not present

## 2017-06-17 DIAGNOSIS — S4992XA Unspecified injury of left shoulder and upper arm, initial encounter: Secondary | ICD-10-CM | POA: Diagnosis not present

## 2017-06-17 DIAGNOSIS — S0101XA Laceration without foreign body of scalp, initial encounter: Secondary | ICD-10-CM | POA: Diagnosis not present

## 2017-06-17 DIAGNOSIS — Y939 Activity, unspecified: Secondary | ICD-10-CM | POA: Insufficient documentation

## 2017-06-17 DIAGNOSIS — S199XXA Unspecified injury of neck, initial encounter: Secondary | ICD-10-CM | POA: Diagnosis not present

## 2017-06-17 MED ORDER — HYDROCODONE-ACETAMINOPHEN 5-325 MG PO TABS
1.0000 | ORAL_TABLET | Freq: Once | ORAL | Status: AC
  Administered 2017-06-17: 1 via ORAL
  Filled 2017-06-17: qty 1

## 2017-06-17 MED ORDER — HYDROCODONE-ACETAMINOPHEN 5-325 MG PO TABS: 1.0000 | ORAL_TABLET | ORAL | 0 refills | Status: DC | PRN

## 2017-06-17 NOTE — ED Provider Notes (Signed)
[place] DEPT Provider Note   CSN: [id number] Arrival date & time: 06/17/17  2025     History   Chief Complaint Chief Complaint  Patient presents with  . Fall    HPI Emily Waller is a 83 y.o. female. Chief complaint is fall  HPI:  Pt fell at home, while walking up stairs. Has Scalp lacerations, and left shoulder pain. She is not anticoagulated. Grades of mild left shoulder pain. Complains of mild headache.  Past Medical History:  Diagnosis Date  . Diabetes mellitus without complication ([place])   . Hypertension     Patient Active Problem List   Diagnosis Date Noted  . Itching 09/12/2015  . Diabetes ([place]) 09/12/2015  . HTN (hypertension) 09/12/2015  . Depression 09/12/2015    History reviewed. No pertinent surgical history.  OB History    No data available       Home Medications    Prior to Admission medications   Medication Sig Start Date End Date Taking? Authorizing Provider  acidophilus (RISAQUAD) CAPS capsule Take 1 capsule by mouth daily.   Yes [provider]  allopurinol (ZYLOPRIM) 300 MG tablet Take 300 mg by mouth daily.   Yes [provider]  amLODipine (NORVASC) 5 MG tablet Take 5 mg by mouth daily.   Yes [provider]  atorvastatin (LIPITOR) 10 MG tablet Take 10 mg by mouth daily.   Yes [provider]  buPROPion (WELLBUTRIN XL) 150 MG 24 hr tablet Take 150 mg by mouth daily.   Yes [provider]  Coenzyme Q10-Fish Oil-Vit E (CO-Q 10 OMEGA-3 FISH OIL) CAPS Take 1 capsule by mouth daily.   Yes [provider]  Ergocalciferol (VITAMIN D2 PO) Take 1 capsule by mouth once a week.   Yes [provider]  ezetimibe (ZETIA) 10 MG tablet Take 10 mg by mouth daily.   Yes [provider]  losartan-hydrochlorothiazide (HYZAAR) 50-12.5 MG tablet Take 1 tablet by mouth daily.   Yes [provider]  mirabegron ER (MYRBETRIQ) 50 MG TB24 tablet Take 50  mg by mouth daily.   Yes [provider]  oxybutynin (DITROPAN-XL) 10 MG 24 hr tablet Take 10 mg by mouth daily.   Yes [provider]  pantoprazole (PROTONIX) 40 MG tablet Take 40 mg by mouth daily.   Yes [provider]  Polyethyl Glycol-Propyl Glycol (SYSTANE ULTRA OP) Apply 1 drop to eye 2 (two) times daily.   Yes [provider]  sertraline (ZOLOFT) 100 MG tablet Take 100 mg by mouth daily.   Yes [provider]  HYDROcodone-acetaminophen (NORCO/VICODIN) 5-325 MG tablet Take 1 tablet by mouth every 4 (four) hours as needed. 06/17/17   Tanna Furry, MD    Family History Family History  Problem Relation Age of Onset  . Hyperlipidemia Mother   . Hypertension Mother   . Cancer Father   . Heart disease Father   . CAD Maternal Grandmother   . CAD Maternal Grandfather   . Lung cancer Sister     Social History Social History   Tobacco Use  . Smoking status: Former Smoker    Last attempt to quit: 10/02/1969    Years since quitting: 47.7  . Smokeless tobacco: Never Used  Substance Use Topics  . Alcohol use: No    Alcohol/week: 0.0 oz    Frequency: Never  . Drug use: No     Allergies   Colcrys [colchicine] and Lovastatin   Review of Systems  Review of Systems  Constitutional: Negative for appetite change, chills, diaphoresis, fatigue and fever.  HENT: Negative for mouth sores, sore throat and trouble swallowing.   Eyes: Negative for visual disturbance.  Respiratory: Negative for cough, chest tightness, shortness of breath and wheezing.   Cardiovascular: Negative for chest pain.  Gastrointestinal: Negative for abdominal distention, abdominal pain, diarrhea, nausea and vomiting.  Endocrine: Negative for polydipsia, polyphagia and polyuria.  Genitourinary: Negative for dysuria, frequency and hematuria.  Musculoskeletal: Negative for gait problem.       Shoulder pain  Skin: Negative for color change, pallor and rash.  Neurological:  Positive for headaches. Negative for dizziness, syncope and light-headedness.  Hematological: Does not bruise/bleed easily.  Psychiatric/Behavioral: Negative for behavioral problems and confusion.     Physical Exam Updated Vital Signs BP (!) 173/63   Pulse 69   Temp 97.7 F (36.5 C) (Oral)   Resp 12   Ht 5\' 7"  (1.702 m)   Wt 70.3 kg (155 lb)   SpO2 97%   BMI 24.28 kg/m   Physical Exam  Constitutional: She is oriented to person, place, and time. She appears well-developed and well-nourished. No distress.  HENT:  Head: Normocephalic.  Less than 1 cm midline occiput scalp laceration. Abrasion to mastoid that is partial-thickness. No blood over her TMs or mastoids.  Eyes: Conjunctivae are normal. Pupils are equal, round, and reactive to light. No scleral icterus.  Neck: Normal range of motion. Neck supple. No thyromegaly present.  Cardiovascular: Normal rate and regular rhythm. Exam reveals no gallop and no friction rub.  No murmur heard. Pulmonary/Chest: Effort normal and breath sounds normal. No respiratory distress. She has no wheezes. She has no rales.  Abdominal: Soft. Bowel sounds are normal. She exhibits no distension. There is no tenderness. There is no rebound.  Musculoskeletal:  Soft tissue swelling and pain at the left shoulder near the surgical neck.  Neurological: She is alert and oriented to person, place, and time.  Skin: Skin is warm and dry. No rash noted.  Psychiatric: She has a normal mood and affect. Her behavior is normal.     ED Treatments / Results  Labs (all labs ordered are listed, but only abnormal results are displayed) Labs Reviewed - No data to display  EKG  EKG Interpretation None       Radiology Dg Shoulder Left  Result Date: 06/17/2017 CLINICAL DATA:  Left shoulder pain post fall. EXAM: LEFT SHOULDER - 2+ VIEW COMPARISON:  None. FINDINGS: There is a comminuted impacted fracture of the left humeral neck. The humeral head is dislocated  inferiorly an rotated in relation to the glenoid. The distal fracture fragment of the humerus is drawn superiorly. There is an associated soft tissue swelling. IMPRESSION: Comminuted impacted fracture dislocation of the left humeral neck. Electronically Signed   By: Fidela Salisbury M.D.   On: 06/17/2017 21:58    Procedures Procedures (including critical care time)  Medications Ordered in ED Medications  HYDROcodone-acetaminophen (NORCO/VICODIN) 5-325 MG per tablet 1 tablet (not administered)  HYDROcodone-acetaminophen (NORCO/VICODIN) 5-325 MG per tablet 1 tablet (1 tablet Oral Given 06/17/17 2159)     Initial Impression / Assessment and Plan / ED Course  I have reviewed the triage vital signs and the nursing notes.  Pertinent labs & imaging results that were available during my care of the patient were reviewed by me and considered in my medical decision making (see chart for details).    Scalp laceration does not require repair. She is  awaiting CT of her head. X-ray shows impacted fracture left proximal humerus.  Does not appear to be dislocation. CT of head, neck, and shoulder pending. Placed in a sling. If shoulder is not dislocated, and CT of head and neck appear normal patient will be appropriate for discharge home. She has family here. His given Vicodin which has helped with pain and she has tolerated well without somnolence.  Final Clinical Impressions(s) / ED Diagnoses   Final diagnoses:  Closed displaced fracture of surgical neck of left humerus, unspecified fracture morphology, initial encounter    ED Discharge Orders        Ordered    HYDROcodone-acetaminophen (NORCO/VICODIN) 5-325 MG tablet  Every 4 hours PRN     06/17/17 2355       Tanna Furry, MD 06/17/17 2355

## 2017-06-17 NOTE — ED Notes (Signed)
Bed: VX42 Expected date:  Expected time:  Means of arrival:  Comments: EMS 83 yo female from home-tripped and fell down 3 steps

## 2017-06-17 NOTE — ED Triage Notes (Signed)
Pt was walking upstairs wearing sandles. Pt fell on left side w/ two small  Lacerations 1 on back of head and 1 behind left ear. Pt did not have LOC.

## 2017-06-17 NOTE — Discharge Instructions (Signed)
Keep sling on. Call Dr.Murphy for follow up appointment. [place] for pain.

## 2017-06-18 ENCOUNTER — Emergency Department (HOSPITAL_COMMUNITY): Payer: Medicare Other

## 2017-06-18 DIAGNOSIS — S42212A Unspecified displaced fracture of surgical neck of left humerus, initial encounter for closed fracture: Secondary | ICD-10-CM | POA: Diagnosis not present

## 2017-06-18 DIAGNOSIS — S199XXA Unspecified injury of neck, initial encounter: Secondary | ICD-10-CM | POA: Diagnosis not present

## 2017-06-18 DIAGNOSIS — S4992XA Unspecified injury of left shoulder and upper arm, initial encounter: Secondary | ICD-10-CM | POA: Diagnosis not present

## 2017-06-18 DIAGNOSIS — S0990XA Unspecified injury of head, initial encounter: Secondary | ICD-10-CM | POA: Diagnosis not present

## 2017-06-18 NOTE — ED Provider Notes (Signed)
Care assumed from Dr. Jeneen Rinks at shift change.  Patient awaiting CT scans of the head, cervical spine, and shoulder.  Head and cervical spine are negative for acute process.  She does have a comminuted, impacted fracture of the left proximal humerus.  There is inferior subluxation that is likely related to a joint effusion, but no dislocation.  She will be discharged with a sling immobilizer and follow-up with orthopedics.   Veryl Speak, MD 06/18/17 [id number]

## 2017-06-22 DIAGNOSIS — S42295A Other nondisplaced fracture of upper end of left humerus, initial encounter for closed fracture: Secondary | ICD-10-CM | POA: Diagnosis not present

## 2017-06-23 DIAGNOSIS — S42212D Unspecified displaced fracture of surgical neck of left humerus, subsequent encounter for fracture with routine healing: Secondary | ICD-10-CM | POA: Diagnosis not present

## 2017-06-23 DIAGNOSIS — I10 Essential (primary) hypertension: Secondary | ICD-10-CM | POA: Diagnosis not present

## 2017-06-23 DIAGNOSIS — F329 Major depressive disorder, single episode, unspecified: Secondary | ICD-10-CM | POA: Diagnosis not present

## 2017-06-23 DIAGNOSIS — Z87891 Personal history of nicotine dependence: Secondary | ICD-10-CM | POA: Diagnosis not present

## 2017-06-23 DIAGNOSIS — Z9181 History of falling: Secondary | ICD-10-CM | POA: Diagnosis not present

## 2017-06-23 DIAGNOSIS — E119 Type 2 diabetes mellitus without complications: Secondary | ICD-10-CM | POA: Diagnosis not present

## 2017-06-25 DIAGNOSIS — R300 Dysuria: Secondary | ICD-10-CM | POA: Diagnosis not present

## 2017-06-26 DIAGNOSIS — Z87891 Personal history of nicotine dependence: Secondary | ICD-10-CM | POA: Diagnosis not present

## 2017-06-26 DIAGNOSIS — E119 Type 2 diabetes mellitus without complications: Secondary | ICD-10-CM | POA: Diagnosis not present

## 2017-06-26 DIAGNOSIS — I10 Essential (primary) hypertension: Secondary | ICD-10-CM | POA: Diagnosis not present

## 2017-06-26 DIAGNOSIS — S42212D Unspecified displaced fracture of surgical neck of left humerus, subsequent encounter for fracture with routine healing: Secondary | ICD-10-CM | POA: Diagnosis not present

## 2017-06-26 DIAGNOSIS — Z9181 History of falling: Secondary | ICD-10-CM | POA: Diagnosis not present

## 2017-06-26 DIAGNOSIS — F329 Major depressive disorder, single episode, unspecified: Secondary | ICD-10-CM | POA: Diagnosis not present

## 2017-07-01 DIAGNOSIS — Z9181 History of falling: Secondary | ICD-10-CM | POA: Diagnosis not present

## 2017-07-01 DIAGNOSIS — Z87891 Personal history of nicotine dependence: Secondary | ICD-10-CM | POA: Diagnosis not present

## 2017-07-01 DIAGNOSIS — I10 Essential (primary) hypertension: Secondary | ICD-10-CM | POA: Diagnosis not present

## 2017-07-01 DIAGNOSIS — E119 Type 2 diabetes mellitus without complications: Secondary | ICD-10-CM | POA: Diagnosis not present

## 2017-07-01 DIAGNOSIS — F329 Major depressive disorder, single episode, unspecified: Secondary | ICD-10-CM | POA: Diagnosis not present

## 2017-07-01 DIAGNOSIS — S42212D Unspecified displaced fracture of surgical neck of left humerus, subsequent encounter for fracture with routine healing: Secondary | ICD-10-CM | POA: Diagnosis not present

## 2017-07-02 DIAGNOSIS — E119 Type 2 diabetes mellitus without complications: Secondary | ICD-10-CM | POA: Diagnosis not present

## 2017-07-02 DIAGNOSIS — S42212D Unspecified displaced fracture of surgical neck of left humerus, subsequent encounter for fracture with routine healing: Secondary | ICD-10-CM | POA: Diagnosis not present

## 2017-07-02 DIAGNOSIS — Z9181 History of falling: Secondary | ICD-10-CM | POA: Diagnosis not present

## 2017-07-02 DIAGNOSIS — I10 Essential (primary) hypertension: Secondary | ICD-10-CM | POA: Diagnosis not present

## 2017-07-02 DIAGNOSIS — Z87891 Personal history of nicotine dependence: Secondary | ICD-10-CM | POA: Diagnosis not present

## 2017-07-02 DIAGNOSIS — F329 Major depressive disorder, single episode, unspecified: Secondary | ICD-10-CM | POA: Diagnosis not present

## 2017-07-03 DIAGNOSIS — Z87891 Personal history of nicotine dependence: Secondary | ICD-10-CM | POA: Diagnosis not present

## 2017-07-03 DIAGNOSIS — Z9181 History of falling: Secondary | ICD-10-CM | POA: Diagnosis not present

## 2017-07-03 DIAGNOSIS — I10 Essential (primary) hypertension: Secondary | ICD-10-CM | POA: Diagnosis not present

## 2017-07-03 DIAGNOSIS — S42212D Unspecified displaced fracture of surgical neck of left humerus, subsequent encounter for fracture with routine healing: Secondary | ICD-10-CM | POA: Diagnosis not present

## 2017-07-03 DIAGNOSIS — F329 Major depressive disorder, single episode, unspecified: Secondary | ICD-10-CM | POA: Diagnosis not present

## 2017-07-03 DIAGNOSIS — E119 Type 2 diabetes mellitus without complications: Secondary | ICD-10-CM | POA: Diagnosis not present

## 2017-07-07 DIAGNOSIS — E119 Type 2 diabetes mellitus without complications: Secondary | ICD-10-CM | POA: Diagnosis not present

## 2017-07-07 DIAGNOSIS — Z9181 History of falling: Secondary | ICD-10-CM | POA: Diagnosis not present

## 2017-07-07 DIAGNOSIS — S42212D Unspecified displaced fracture of surgical neck of left humerus, subsequent encounter for fracture with routine healing: Secondary | ICD-10-CM | POA: Diagnosis not present

## 2017-07-07 DIAGNOSIS — Z87891 Personal history of nicotine dependence: Secondary | ICD-10-CM | POA: Diagnosis not present

## 2017-07-07 DIAGNOSIS — I10 Essential (primary) hypertension: Secondary | ICD-10-CM | POA: Diagnosis not present

## 2017-07-07 DIAGNOSIS — F329 Major depressive disorder, single episode, unspecified: Secondary | ICD-10-CM | POA: Diagnosis not present

## 2017-07-08 DIAGNOSIS — S42295D Other nondisplaced fracture of upper end of left humerus, subsequent encounter for fracture with routine healing: Secondary | ICD-10-CM | POA: Diagnosis not present

## 2017-07-09 DIAGNOSIS — Z87891 Personal history of nicotine dependence: Secondary | ICD-10-CM | POA: Diagnosis not present

## 2017-07-09 DIAGNOSIS — S42212D Unspecified displaced fracture of surgical neck of left humerus, subsequent encounter for fracture with routine healing: Secondary | ICD-10-CM | POA: Diagnosis not present

## 2017-07-09 DIAGNOSIS — F329 Major depressive disorder, single episode, unspecified: Secondary | ICD-10-CM | POA: Diagnosis not present

## 2017-07-09 DIAGNOSIS — Z9181 History of falling: Secondary | ICD-10-CM | POA: Diagnosis not present

## 2017-07-09 DIAGNOSIS — E119 Type 2 diabetes mellitus without complications: Secondary | ICD-10-CM | POA: Diagnosis not present

## 2017-07-09 DIAGNOSIS — I10 Essential (primary) hypertension: Secondary | ICD-10-CM | POA: Diagnosis not present

## 2017-07-10 DIAGNOSIS — I10 Essential (primary) hypertension: Secondary | ICD-10-CM | POA: Diagnosis not present

## 2017-07-10 DIAGNOSIS — E119 Type 2 diabetes mellitus without complications: Secondary | ICD-10-CM | POA: Diagnosis not present

## 2017-07-10 DIAGNOSIS — F329 Major depressive disorder, single episode, unspecified: Secondary | ICD-10-CM | POA: Diagnosis not present

## 2017-07-10 DIAGNOSIS — Z87891 Personal history of nicotine dependence: Secondary | ICD-10-CM | POA: Diagnosis not present

## 2017-07-10 DIAGNOSIS — Z9181 History of falling: Secondary | ICD-10-CM | POA: Diagnosis not present

## 2017-07-10 DIAGNOSIS — S42212D Unspecified displaced fracture of surgical neck of left humerus, subsequent encounter for fracture with routine healing: Secondary | ICD-10-CM | POA: Diagnosis not present

## 2017-07-13 DIAGNOSIS — Z9181 History of falling: Secondary | ICD-10-CM | POA: Diagnosis not present

## 2017-07-13 DIAGNOSIS — Z87891 Personal history of nicotine dependence: Secondary | ICD-10-CM | POA: Diagnosis not present

## 2017-07-13 DIAGNOSIS — I10 Essential (primary) hypertension: Secondary | ICD-10-CM | POA: Diagnosis not present

## 2017-07-13 DIAGNOSIS — S42212D Unspecified displaced fracture of surgical neck of left humerus, subsequent encounter for fracture with routine healing: Secondary | ICD-10-CM | POA: Diagnosis not present

## 2017-07-13 DIAGNOSIS — E119 Type 2 diabetes mellitus without complications: Secondary | ICD-10-CM | POA: Diagnosis not present

## 2017-07-13 DIAGNOSIS — F329 Major depressive disorder, single episode, unspecified: Secondary | ICD-10-CM | POA: Diagnosis not present

## 2017-07-15 DIAGNOSIS — Z9181 History of falling: Secondary | ICD-10-CM | POA: Diagnosis not present

## 2017-07-15 DIAGNOSIS — F329 Major depressive disorder, single episode, unspecified: Secondary | ICD-10-CM | POA: Diagnosis not present

## 2017-07-15 DIAGNOSIS — E119 Type 2 diabetes mellitus without complications: Secondary | ICD-10-CM | POA: Diagnosis not present

## 2017-07-15 DIAGNOSIS — I10 Essential (primary) hypertension: Secondary | ICD-10-CM | POA: Diagnosis not present

## 2017-07-15 DIAGNOSIS — Z87891 Personal history of nicotine dependence: Secondary | ICD-10-CM | POA: Diagnosis not present

## 2017-07-15 DIAGNOSIS — S42212D Unspecified displaced fracture of surgical neck of left humerus, subsequent encounter for fracture with routine healing: Secondary | ICD-10-CM | POA: Diagnosis not present

## 2017-07-17 DIAGNOSIS — Z87891 Personal history of nicotine dependence: Secondary | ICD-10-CM | POA: Diagnosis not present

## 2017-07-17 DIAGNOSIS — Z9181 History of falling: Secondary | ICD-10-CM | POA: Diagnosis not present

## 2017-07-17 DIAGNOSIS — F329 Major depressive disorder, single episode, unspecified: Secondary | ICD-10-CM | POA: Diagnosis not present

## 2017-07-17 DIAGNOSIS — S42212D Unspecified displaced fracture of surgical neck of left humerus, subsequent encounter for fracture with routine healing: Secondary | ICD-10-CM | POA: Diagnosis not present

## 2017-07-17 DIAGNOSIS — I10 Essential (primary) hypertension: Secondary | ICD-10-CM | POA: Diagnosis not present

## 2017-07-17 DIAGNOSIS — E119 Type 2 diabetes mellitus without complications: Secondary | ICD-10-CM | POA: Diagnosis not present

## 2017-07-22 DIAGNOSIS — I10 Essential (primary) hypertension: Secondary | ICD-10-CM | POA: Diagnosis not present

## 2017-07-22 DIAGNOSIS — S42212D Unspecified displaced fracture of surgical neck of left humerus, subsequent encounter for fracture with routine healing: Secondary | ICD-10-CM | POA: Diagnosis not present

## 2017-07-22 DIAGNOSIS — Z9181 History of falling: Secondary | ICD-10-CM | POA: Diagnosis not present

## 2017-07-22 DIAGNOSIS — Z87891 Personal history of nicotine dependence: Secondary | ICD-10-CM | POA: Diagnosis not present

## 2017-07-22 DIAGNOSIS — F329 Major depressive disorder, single episode, unspecified: Secondary | ICD-10-CM | POA: Diagnosis not present

## 2017-07-22 DIAGNOSIS — E119 Type 2 diabetes mellitus without complications: Secondary | ICD-10-CM | POA: Diagnosis not present

## 2017-07-23 DIAGNOSIS — F329 Major depressive disorder, single episode, unspecified: Secondary | ICD-10-CM | POA: Diagnosis not present

## 2017-07-23 DIAGNOSIS — E119 Type 2 diabetes mellitus without complications: Secondary | ICD-10-CM | POA: Diagnosis not present

## 2017-07-23 DIAGNOSIS — S42212D Unspecified displaced fracture of surgical neck of left humerus, subsequent encounter for fracture with routine healing: Secondary | ICD-10-CM | POA: Diagnosis not present

## 2017-07-23 DIAGNOSIS — Z9181 History of falling: Secondary | ICD-10-CM | POA: Diagnosis not present

## 2017-07-23 DIAGNOSIS — I10 Essential (primary) hypertension: Secondary | ICD-10-CM | POA: Diagnosis not present

## 2017-07-23 DIAGNOSIS — Z87891 Personal history of nicotine dependence: Secondary | ICD-10-CM | POA: Diagnosis not present

## 2017-07-24 DIAGNOSIS — Z87891 Personal history of nicotine dependence: Secondary | ICD-10-CM | POA: Diagnosis not present

## 2017-07-24 DIAGNOSIS — E119 Type 2 diabetes mellitus without complications: Secondary | ICD-10-CM | POA: Diagnosis not present

## 2017-07-24 DIAGNOSIS — Z9181 History of falling: Secondary | ICD-10-CM | POA: Diagnosis not present

## 2017-07-24 DIAGNOSIS — S42212D Unspecified displaced fracture of surgical neck of left humerus, subsequent encounter for fracture with routine healing: Secondary | ICD-10-CM | POA: Diagnosis not present

## 2017-07-24 DIAGNOSIS — I10 Essential (primary) hypertension: Secondary | ICD-10-CM | POA: Diagnosis not present

## 2017-07-24 DIAGNOSIS — F329 Major depressive disorder, single episode, unspecified: Secondary | ICD-10-CM | POA: Diagnosis not present

## 2017-07-28 DIAGNOSIS — E119 Type 2 diabetes mellitus without complications: Secondary | ICD-10-CM | POA: Diagnosis not present

## 2017-07-28 DIAGNOSIS — Z9181 History of falling: Secondary | ICD-10-CM | POA: Diagnosis not present

## 2017-07-28 DIAGNOSIS — Z87891 Personal history of nicotine dependence: Secondary | ICD-10-CM | POA: Diagnosis not present

## 2017-07-28 DIAGNOSIS — I10 Essential (primary) hypertension: Secondary | ICD-10-CM | POA: Diagnosis not present

## 2017-07-28 DIAGNOSIS — F329 Major depressive disorder, single episode, unspecified: Secondary | ICD-10-CM | POA: Diagnosis not present

## 2017-07-28 DIAGNOSIS — S42212D Unspecified displaced fracture of surgical neck of left humerus, subsequent encounter for fracture with routine healing: Secondary | ICD-10-CM | POA: Diagnosis not present

## 2017-07-30 DIAGNOSIS — I10 Essential (primary) hypertension: Secondary | ICD-10-CM | POA: Diagnosis not present

## 2017-07-30 DIAGNOSIS — E119 Type 2 diabetes mellitus without complications: Secondary | ICD-10-CM | POA: Diagnosis not present

## 2017-07-30 DIAGNOSIS — F329 Major depressive disorder, single episode, unspecified: Secondary | ICD-10-CM | POA: Diagnosis not present

## 2017-07-30 DIAGNOSIS — S42212D Unspecified displaced fracture of surgical neck of left humerus, subsequent encounter for fracture with routine healing: Secondary | ICD-10-CM | POA: Diagnosis not present

## 2017-07-30 DIAGNOSIS — Z9181 History of falling: Secondary | ICD-10-CM | POA: Diagnosis not present

## 2017-07-30 DIAGNOSIS — Z87891 Personal history of nicotine dependence: Secondary | ICD-10-CM | POA: Diagnosis not present

## 2017-08-03 DIAGNOSIS — F329 Major depressive disorder, single episode, unspecified: Secondary | ICD-10-CM | POA: Diagnosis not present

## 2017-08-03 DIAGNOSIS — I10 Essential (primary) hypertension: Secondary | ICD-10-CM | POA: Diagnosis not present

## 2017-08-03 DIAGNOSIS — E119 Type 2 diabetes mellitus without complications: Secondary | ICD-10-CM | POA: Diagnosis not present

## 2017-08-03 DIAGNOSIS — Z87891 Personal history of nicotine dependence: Secondary | ICD-10-CM | POA: Diagnosis not present

## 2017-08-03 DIAGNOSIS — Z9181 History of falling: Secondary | ICD-10-CM | POA: Diagnosis not present

## 2017-08-03 DIAGNOSIS — S42212D Unspecified displaced fracture of surgical neck of left humerus, subsequent encounter for fracture with routine healing: Secondary | ICD-10-CM | POA: Diagnosis not present

## 2017-08-04 DIAGNOSIS — Z9181 History of falling: Secondary | ICD-10-CM | POA: Diagnosis not present

## 2017-08-04 DIAGNOSIS — S42212D Unspecified displaced fracture of surgical neck of left humerus, subsequent encounter for fracture with routine healing: Secondary | ICD-10-CM | POA: Diagnosis not present

## 2017-08-04 DIAGNOSIS — I10 Essential (primary) hypertension: Secondary | ICD-10-CM | POA: Diagnosis not present

## 2017-08-04 DIAGNOSIS — Z87891 Personal history of nicotine dependence: Secondary | ICD-10-CM | POA: Diagnosis not present

## 2017-08-04 DIAGNOSIS — F329 Major depressive disorder, single episode, unspecified: Secondary | ICD-10-CM | POA: Diagnosis not present

## 2017-08-04 DIAGNOSIS — E119 Type 2 diabetes mellitus without complications: Secondary | ICD-10-CM | POA: Diagnosis not present

## 2017-08-05 DIAGNOSIS — S42295D Other nondisplaced fracture of upper end of left humerus, subsequent encounter for fracture with routine healing: Secondary | ICD-10-CM | POA: Diagnosis not present

## 2017-08-06 DIAGNOSIS — Z87891 Personal history of nicotine dependence: Secondary | ICD-10-CM | POA: Diagnosis not present

## 2017-08-06 DIAGNOSIS — Z9181 History of falling: Secondary | ICD-10-CM | POA: Diagnosis not present

## 2017-08-06 DIAGNOSIS — S42212D Unspecified displaced fracture of surgical neck of left humerus, subsequent encounter for fracture with routine healing: Secondary | ICD-10-CM | POA: Diagnosis not present

## 2017-08-06 DIAGNOSIS — I10 Essential (primary) hypertension: Secondary | ICD-10-CM | POA: Diagnosis not present

## 2017-08-06 DIAGNOSIS — E119 Type 2 diabetes mellitus without complications: Secondary | ICD-10-CM | POA: Diagnosis not present

## 2017-08-06 DIAGNOSIS — F329 Major depressive disorder, single episode, unspecified: Secondary | ICD-10-CM | POA: Diagnosis not present

## 2017-08-10 DIAGNOSIS — F329 Major depressive disorder, single episode, unspecified: Secondary | ICD-10-CM | POA: Diagnosis not present

## 2017-08-10 DIAGNOSIS — Z87891 Personal history of nicotine dependence: Secondary | ICD-10-CM | POA: Diagnosis not present

## 2017-08-10 DIAGNOSIS — E119 Type 2 diabetes mellitus without complications: Secondary | ICD-10-CM | POA: Diagnosis not present

## 2017-08-10 DIAGNOSIS — I10 Essential (primary) hypertension: Secondary | ICD-10-CM | POA: Diagnosis not present

## 2017-08-10 DIAGNOSIS — Z9181 History of falling: Secondary | ICD-10-CM | POA: Diagnosis not present

## 2017-08-10 DIAGNOSIS — S42212D Unspecified displaced fracture of surgical neck of left humerus, subsequent encounter for fracture with routine healing: Secondary | ICD-10-CM | POA: Diagnosis not present

## 2017-08-12 DIAGNOSIS — E119 Type 2 diabetes mellitus without complications: Secondary | ICD-10-CM | POA: Diagnosis not present

## 2017-08-12 DIAGNOSIS — I10 Essential (primary) hypertension: Secondary | ICD-10-CM | POA: Diagnosis not present

## 2017-08-12 DIAGNOSIS — F329 Major depressive disorder, single episode, unspecified: Secondary | ICD-10-CM | POA: Diagnosis not present

## 2017-08-12 DIAGNOSIS — Z87891 Personal history of nicotine dependence: Secondary | ICD-10-CM | POA: Diagnosis not present

## 2017-08-12 DIAGNOSIS — S42212D Unspecified displaced fracture of surgical neck of left humerus, subsequent encounter for fracture with routine healing: Secondary | ICD-10-CM | POA: Diagnosis not present

## 2017-08-12 DIAGNOSIS — Z9181 History of falling: Secondary | ICD-10-CM | POA: Diagnosis not present

## 2017-08-14 DIAGNOSIS — I10 Essential (primary) hypertension: Secondary | ICD-10-CM | POA: Diagnosis not present

## 2017-08-14 DIAGNOSIS — F329 Major depressive disorder, single episode, unspecified: Secondary | ICD-10-CM | POA: Diagnosis not present

## 2017-08-14 DIAGNOSIS — Z9181 History of falling: Secondary | ICD-10-CM | POA: Diagnosis not present

## 2017-08-14 DIAGNOSIS — S42212D Unspecified displaced fracture of surgical neck of left humerus, subsequent encounter for fracture with routine healing: Secondary | ICD-10-CM | POA: Diagnosis not present

## 2017-08-14 DIAGNOSIS — Z87891 Personal history of nicotine dependence: Secondary | ICD-10-CM | POA: Diagnosis not present

## 2017-08-14 DIAGNOSIS — E119 Type 2 diabetes mellitus without complications: Secondary | ICD-10-CM | POA: Diagnosis not present

## 2017-08-17 DIAGNOSIS — E119 Type 2 diabetes mellitus without complications: Secondary | ICD-10-CM | POA: Diagnosis not present

## 2017-08-17 DIAGNOSIS — F329 Major depressive disorder, single episode, unspecified: Secondary | ICD-10-CM | POA: Diagnosis not present

## 2017-08-17 DIAGNOSIS — I10 Essential (primary) hypertension: Secondary | ICD-10-CM | POA: Diagnosis not present

## 2017-08-17 DIAGNOSIS — S42212D Unspecified displaced fracture of surgical neck of left humerus, subsequent encounter for fracture with routine healing: Secondary | ICD-10-CM | POA: Diagnosis not present

## 2017-08-17 DIAGNOSIS — Z87891 Personal history of nicotine dependence: Secondary | ICD-10-CM | POA: Diagnosis not present

## 2017-08-17 DIAGNOSIS — Z9181 History of falling: Secondary | ICD-10-CM | POA: Diagnosis not present

## 2017-08-18 DIAGNOSIS — I10 Essential (primary) hypertension: Secondary | ICD-10-CM | POA: Diagnosis not present

## 2017-08-18 DIAGNOSIS — S42212D Unspecified displaced fracture of surgical neck of left humerus, subsequent encounter for fracture with routine healing: Secondary | ICD-10-CM | POA: Diagnosis not present

## 2017-08-18 DIAGNOSIS — Z9181 History of falling: Secondary | ICD-10-CM | POA: Diagnosis not present

## 2017-08-18 DIAGNOSIS — F329 Major depressive disorder, single episode, unspecified: Secondary | ICD-10-CM | POA: Diagnosis not present

## 2017-08-18 DIAGNOSIS — E119 Type 2 diabetes mellitus without complications: Secondary | ICD-10-CM | POA: Diagnosis not present

## 2017-08-18 DIAGNOSIS — Z87891 Personal history of nicotine dependence: Secondary | ICD-10-CM | POA: Diagnosis not present

## 2017-08-20 DIAGNOSIS — I10 Essential (primary) hypertension: Secondary | ICD-10-CM | POA: Diagnosis not present

## 2017-08-20 DIAGNOSIS — E119 Type 2 diabetes mellitus without complications: Secondary | ICD-10-CM | POA: Diagnosis not present

## 2017-08-20 DIAGNOSIS — Z9181 History of falling: Secondary | ICD-10-CM | POA: Diagnosis not present

## 2017-08-20 DIAGNOSIS — F329 Major depressive disorder, single episode, unspecified: Secondary | ICD-10-CM | POA: Diagnosis not present

## 2017-08-20 DIAGNOSIS — S42212D Unspecified displaced fracture of surgical neck of left humerus, subsequent encounter for fracture with routine healing: Secondary | ICD-10-CM | POA: Diagnosis not present

## 2017-08-20 DIAGNOSIS — Z87891 Personal history of nicotine dependence: Secondary | ICD-10-CM | POA: Diagnosis not present

## 2017-08-22 DIAGNOSIS — I10 Essential (primary) hypertension: Secondary | ICD-10-CM | POA: Diagnosis not present

## 2017-08-22 DIAGNOSIS — Z87891 Personal history of nicotine dependence: Secondary | ICD-10-CM | POA: Diagnosis not present

## 2017-08-22 DIAGNOSIS — E119 Type 2 diabetes mellitus without complications: Secondary | ICD-10-CM | POA: Diagnosis not present

## 2017-08-22 DIAGNOSIS — S42212D Unspecified displaced fracture of surgical neck of left humerus, subsequent encounter for fracture with routine healing: Secondary | ICD-10-CM | POA: Diagnosis not present

## 2017-08-22 DIAGNOSIS — Z9181 History of falling: Secondary | ICD-10-CM | POA: Diagnosis not present

## 2017-08-22 DIAGNOSIS — F329 Major depressive disorder, single episode, unspecified: Secondary | ICD-10-CM | POA: Diagnosis not present

## 2017-08-25 DIAGNOSIS — I10 Essential (primary) hypertension: Secondary | ICD-10-CM | POA: Diagnosis not present

## 2017-08-25 DIAGNOSIS — E119 Type 2 diabetes mellitus without complications: Secondary | ICD-10-CM | POA: Diagnosis not present

## 2017-08-25 DIAGNOSIS — Z9181 History of falling: Secondary | ICD-10-CM | POA: Diagnosis not present

## 2017-08-25 DIAGNOSIS — S42212D Unspecified displaced fracture of surgical neck of left humerus, subsequent encounter for fracture with routine healing: Secondary | ICD-10-CM | POA: Diagnosis not present

## 2017-08-25 DIAGNOSIS — F329 Major depressive disorder, single episode, unspecified: Secondary | ICD-10-CM | POA: Diagnosis not present

## 2017-08-25 DIAGNOSIS — Z87891 Personal history of nicotine dependence: Secondary | ICD-10-CM | POA: Diagnosis not present

## 2017-08-26 DIAGNOSIS — Z87891 Personal history of nicotine dependence: Secondary | ICD-10-CM | POA: Diagnosis not present

## 2017-08-26 DIAGNOSIS — Z9181 History of falling: Secondary | ICD-10-CM | POA: Diagnosis not present

## 2017-08-26 DIAGNOSIS — E119 Type 2 diabetes mellitus without complications: Secondary | ICD-10-CM | POA: Diagnosis not present

## 2017-08-26 DIAGNOSIS — I10 Essential (primary) hypertension: Secondary | ICD-10-CM | POA: Diagnosis not present

## 2017-08-26 DIAGNOSIS — F329 Major depressive disorder, single episode, unspecified: Secondary | ICD-10-CM | POA: Diagnosis not present

## 2017-08-26 DIAGNOSIS — S42212D Unspecified displaced fracture of surgical neck of left humerus, subsequent encounter for fracture with routine healing: Secondary | ICD-10-CM | POA: Diagnosis not present

## 2017-08-27 DIAGNOSIS — I10 Essential (primary) hypertension: Secondary | ICD-10-CM | POA: Diagnosis not present

## 2017-08-27 DIAGNOSIS — Z87891 Personal history of nicotine dependence: Secondary | ICD-10-CM | POA: Diagnosis not present

## 2017-08-27 DIAGNOSIS — F329 Major depressive disorder, single episode, unspecified: Secondary | ICD-10-CM | POA: Diagnosis not present

## 2017-08-27 DIAGNOSIS — Z9181 History of falling: Secondary | ICD-10-CM | POA: Diagnosis not present

## 2017-08-27 DIAGNOSIS — S42212D Unspecified displaced fracture of surgical neck of left humerus, subsequent encounter for fracture with routine healing: Secondary | ICD-10-CM | POA: Diagnosis not present

## 2017-08-27 DIAGNOSIS — E119 Type 2 diabetes mellitus without complications: Secondary | ICD-10-CM | POA: Diagnosis not present

## 2017-09-01 DIAGNOSIS — F329 Major depressive disorder, single episode, unspecified: Secondary | ICD-10-CM | POA: Diagnosis not present

## 2017-09-01 DIAGNOSIS — E119 Type 2 diabetes mellitus without complications: Secondary | ICD-10-CM | POA: Diagnosis not present

## 2017-09-01 DIAGNOSIS — S42212D Unspecified displaced fracture of surgical neck of left humerus, subsequent encounter for fracture with routine healing: Secondary | ICD-10-CM | POA: Diagnosis not present

## 2017-09-01 DIAGNOSIS — I10 Essential (primary) hypertension: Secondary | ICD-10-CM | POA: Diagnosis not present

## 2017-09-01 DIAGNOSIS — Z87891 Personal history of nicotine dependence: Secondary | ICD-10-CM | POA: Diagnosis not present

## 2017-09-01 DIAGNOSIS — Z9181 History of falling: Secondary | ICD-10-CM | POA: Diagnosis not present

## 2017-09-02 DIAGNOSIS — Z9181 History of falling: Secondary | ICD-10-CM | POA: Diagnosis not present

## 2017-09-02 DIAGNOSIS — I10 Essential (primary) hypertension: Secondary | ICD-10-CM | POA: Diagnosis not present

## 2017-09-02 DIAGNOSIS — F329 Major depressive disorder, single episode, unspecified: Secondary | ICD-10-CM | POA: Diagnosis not present

## 2017-09-02 DIAGNOSIS — Z87891 Personal history of nicotine dependence: Secondary | ICD-10-CM | POA: Diagnosis not present

## 2017-09-02 DIAGNOSIS — E119 Type 2 diabetes mellitus without complications: Secondary | ICD-10-CM | POA: Diagnosis not present

## 2017-09-02 DIAGNOSIS — S42212D Unspecified displaced fracture of surgical neck of left humerus, subsequent encounter for fracture with routine healing: Secondary | ICD-10-CM | POA: Diagnosis not present

## 2017-09-03 DIAGNOSIS — Z9181 History of falling: Secondary | ICD-10-CM | POA: Diagnosis not present

## 2017-09-03 DIAGNOSIS — E119 Type 2 diabetes mellitus without complications: Secondary | ICD-10-CM | POA: Diagnosis not present

## 2017-09-03 DIAGNOSIS — Z87891 Personal history of nicotine dependence: Secondary | ICD-10-CM | POA: Diagnosis not present

## 2017-09-03 DIAGNOSIS — I10 Essential (primary) hypertension: Secondary | ICD-10-CM | POA: Diagnosis not present

## 2017-09-03 DIAGNOSIS — S42212D Unspecified displaced fracture of surgical neck of left humerus, subsequent encounter for fracture with routine healing: Secondary | ICD-10-CM | POA: Diagnosis not present

## 2017-09-03 DIAGNOSIS — F329 Major depressive disorder, single episode, unspecified: Secondary | ICD-10-CM | POA: Diagnosis not present

## 2017-09-08 DIAGNOSIS — F329 Major depressive disorder, single episode, unspecified: Secondary | ICD-10-CM | POA: Diagnosis not present

## 2017-09-08 DIAGNOSIS — E119 Type 2 diabetes mellitus without complications: Secondary | ICD-10-CM | POA: Diagnosis not present

## 2017-09-08 DIAGNOSIS — I10 Essential (primary) hypertension: Secondary | ICD-10-CM | POA: Diagnosis not present

## 2017-09-08 DIAGNOSIS — Z87891 Personal history of nicotine dependence: Secondary | ICD-10-CM | POA: Diagnosis not present

## 2017-09-08 DIAGNOSIS — Z9181 History of falling: Secondary | ICD-10-CM | POA: Diagnosis not present

## 2017-09-08 DIAGNOSIS — S42212D Unspecified displaced fracture of surgical neck of left humerus, subsequent encounter for fracture with routine healing: Secondary | ICD-10-CM | POA: Diagnosis not present

## 2017-09-10 DIAGNOSIS — Z87891 Personal history of nicotine dependence: Secondary | ICD-10-CM | POA: Diagnosis not present

## 2017-09-10 DIAGNOSIS — I10 Essential (primary) hypertension: Secondary | ICD-10-CM | POA: Diagnosis not present

## 2017-09-10 DIAGNOSIS — F329 Major depressive disorder, single episode, unspecified: Secondary | ICD-10-CM | POA: Diagnosis not present

## 2017-09-10 DIAGNOSIS — E119 Type 2 diabetes mellitus without complications: Secondary | ICD-10-CM | POA: Diagnosis not present

## 2017-09-10 DIAGNOSIS — S42212D Unspecified displaced fracture of surgical neck of left humerus, subsequent encounter for fracture with routine healing: Secondary | ICD-10-CM | POA: Diagnosis not present

## 2017-09-10 DIAGNOSIS — Z9181 History of falling: Secondary | ICD-10-CM | POA: Diagnosis not present

## 2017-09-15 DIAGNOSIS — Z9181 History of falling: Secondary | ICD-10-CM | POA: Diagnosis not present

## 2017-09-15 DIAGNOSIS — Z87891 Personal history of nicotine dependence: Secondary | ICD-10-CM | POA: Diagnosis not present

## 2017-09-15 DIAGNOSIS — I10 Essential (primary) hypertension: Secondary | ICD-10-CM | POA: Diagnosis not present

## 2017-09-15 DIAGNOSIS — F329 Major depressive disorder, single episode, unspecified: Secondary | ICD-10-CM | POA: Diagnosis not present

## 2017-09-15 DIAGNOSIS — S42212D Unspecified displaced fracture of surgical neck of left humerus, subsequent encounter for fracture with routine healing: Secondary | ICD-10-CM | POA: Diagnosis not present

## 2017-09-15 DIAGNOSIS — E119 Type 2 diabetes mellitus without complications: Secondary | ICD-10-CM | POA: Diagnosis not present

## 2017-09-17 DIAGNOSIS — Z87891 Personal history of nicotine dependence: Secondary | ICD-10-CM | POA: Diagnosis not present

## 2017-09-17 DIAGNOSIS — F329 Major depressive disorder, single episode, unspecified: Secondary | ICD-10-CM | POA: Diagnosis not present

## 2017-09-17 DIAGNOSIS — Z9181 History of falling: Secondary | ICD-10-CM | POA: Diagnosis not present

## 2017-09-17 DIAGNOSIS — E119 Type 2 diabetes mellitus without complications: Secondary | ICD-10-CM | POA: Diagnosis not present

## 2017-09-17 DIAGNOSIS — I10 Essential (primary) hypertension: Secondary | ICD-10-CM | POA: Diagnosis not present

## 2017-09-17 DIAGNOSIS — S42212D Unspecified displaced fracture of surgical neck of left humerus, subsequent encounter for fracture with routine healing: Secondary | ICD-10-CM | POA: Diagnosis not present

## 2017-10-30 DIAGNOSIS — F329 Major depressive disorder, single episode, unspecified: Secondary | ICD-10-CM | POA: Diagnosis not present

## 2017-10-30 DIAGNOSIS — M109 Gout, unspecified: Secondary | ICD-10-CM | POA: Diagnosis not present

## 2017-10-30 DIAGNOSIS — Z111 Encounter for screening for respiratory tuberculosis: Secondary | ICD-10-CM | POA: Diagnosis not present

## 2017-10-30 DIAGNOSIS — E785 Hyperlipidemia, unspecified: Secondary | ICD-10-CM | POA: Diagnosis not present

## 2017-10-30 DIAGNOSIS — M1990 Unspecified osteoarthritis, unspecified site: Secondary | ICD-10-CM | POA: Diagnosis not present

## 2017-10-30 DIAGNOSIS — I10 Essential (primary) hypertension: Secondary | ICD-10-CM | POA: Diagnosis not present

## 2017-10-30 DIAGNOSIS — E1142 Type 2 diabetes mellitus with diabetic polyneuropathy: Secondary | ICD-10-CM | POA: Diagnosis not present

## 2017-10-30 DIAGNOSIS — N183 Chronic kidney disease, stage 3 (moderate): Secondary | ICD-10-CM | POA: Diagnosis not present

## 2017-10-30 DIAGNOSIS — I2510 Atherosclerotic heart disease of native coronary artery without angina pectoris: Secondary | ICD-10-CM | POA: Diagnosis not present

## 2017-10-30 DIAGNOSIS — K279 Peptic ulcer, site unspecified, unspecified as acute or chronic, without hemorrhage or perforation: Secondary | ICD-10-CM | POA: Diagnosis not present

## 2017-10-30 DIAGNOSIS — E1122 Type 2 diabetes mellitus with diabetic chronic kidney disease: Secondary | ICD-10-CM | POA: Diagnosis not present

## 2017-11-11 DIAGNOSIS — G301 Alzheimer's disease with late onset: Secondary | ICD-10-CM | POA: Diagnosis not present

## 2017-11-11 DIAGNOSIS — F0280 Dementia in other diseases classified elsewhere without behavioral disturbance: Secondary | ICD-10-CM | POA: Diagnosis not present

## 2017-11-11 DIAGNOSIS — F331 Major depressive disorder, recurrent, moderate: Secondary | ICD-10-CM | POA: Diagnosis not present

## 2017-11-13 DIAGNOSIS — M1000 Idiopathic gout, unspecified site: Secondary | ICD-10-CM | POA: Diagnosis not present

## 2017-11-13 DIAGNOSIS — N39498 Other specified urinary incontinence: Secondary | ICD-10-CM | POA: Diagnosis not present

## 2017-11-13 DIAGNOSIS — E782 Mixed hyperlipidemia: Secondary | ICD-10-CM | POA: Diagnosis not present

## 2017-11-13 DIAGNOSIS — K219 Gastro-esophageal reflux disease without esophagitis: Secondary | ICD-10-CM | POA: Diagnosis not present

## 2017-11-13 DIAGNOSIS — E559 Vitamin D deficiency, unspecified: Secondary | ICD-10-CM | POA: Diagnosis not present

## 2017-11-13 DIAGNOSIS — R269 Unspecified abnormalities of gait and mobility: Secondary | ICD-10-CM | POA: Diagnosis not present

## 2017-11-13 DIAGNOSIS — I10 Essential (primary) hypertension: Secondary | ICD-10-CM | POA: Diagnosis not present

## 2017-11-13 DIAGNOSIS — F339 Major depressive disorder, recurrent, unspecified: Secondary | ICD-10-CM | POA: Diagnosis not present

## 2017-11-16 DIAGNOSIS — I10 Essential (primary) hypertension: Secondary | ICD-10-CM | POA: Diagnosis not present

## 2017-11-16 DIAGNOSIS — M1000 Idiopathic gout, unspecified site: Secondary | ICD-10-CM | POA: Diagnosis not present

## 2017-11-20 DIAGNOSIS — I129 Hypertensive chronic kidney disease with stage 1 through stage 4 chronic kidney disease, or unspecified chronic kidney disease: Secondary | ICD-10-CM | POA: Diagnosis not present

## 2017-11-20 DIAGNOSIS — N183 Chronic kidney disease, stage 3 (moderate): Secondary | ICD-10-CM | POA: Diagnosis not present

## 2017-11-20 DIAGNOSIS — I2510 Atherosclerotic heart disease of native coronary artery without angina pectoris: Secondary | ICD-10-CM | POA: Diagnosis not present

## 2017-11-20 DIAGNOSIS — M109 Gout, unspecified: Secondary | ICD-10-CM | POA: Diagnosis not present

## 2017-11-20 DIAGNOSIS — F329 Major depressive disorder, single episode, unspecified: Secondary | ICD-10-CM | POA: Diagnosis not present

## 2017-11-20 DIAGNOSIS — E1122 Type 2 diabetes mellitus with diabetic chronic kidney disease: Secondary | ICD-10-CM | POA: Diagnosis not present

## 2017-11-20 DIAGNOSIS — E1142 Type 2 diabetes mellitus with diabetic polyneuropathy: Secondary | ICD-10-CM | POA: Diagnosis not present

## 2017-11-20 DIAGNOSIS — Z9181 History of falling: Secondary | ICD-10-CM | POA: Diagnosis not present

## 2017-11-24 DIAGNOSIS — M109 Gout, unspecified: Secondary | ICD-10-CM | POA: Diagnosis not present

## 2017-11-24 DIAGNOSIS — I129 Hypertensive chronic kidney disease with stage 1 through stage 4 chronic kidney disease, or unspecified chronic kidney disease: Secondary | ICD-10-CM | POA: Diagnosis not present

## 2017-11-24 DIAGNOSIS — E1122 Type 2 diabetes mellitus with diabetic chronic kidney disease: Secondary | ICD-10-CM | POA: Diagnosis not present

## 2017-11-24 DIAGNOSIS — I2510 Atherosclerotic heart disease of native coronary artery without angina pectoris: Secondary | ICD-10-CM | POA: Diagnosis not present

## 2017-11-24 DIAGNOSIS — N183 Chronic kidney disease, stage 3 (moderate): Secondary | ICD-10-CM | POA: Diagnosis not present

## 2017-11-24 DIAGNOSIS — E1142 Type 2 diabetes mellitus with diabetic polyneuropathy: Secondary | ICD-10-CM | POA: Diagnosis not present

## 2017-11-25 DIAGNOSIS — I129 Hypertensive chronic kidney disease with stage 1 through stage 4 chronic kidney disease, or unspecified chronic kidney disease: Secondary | ICD-10-CM | POA: Diagnosis not present

## 2017-11-25 DIAGNOSIS — M109 Gout, unspecified: Secondary | ICD-10-CM | POA: Diagnosis not present

## 2017-11-25 DIAGNOSIS — E1122 Type 2 diabetes mellitus with diabetic chronic kidney disease: Secondary | ICD-10-CM | POA: Diagnosis not present

## 2017-11-25 DIAGNOSIS — N183 Chronic kidney disease, stage 3 (moderate): Secondary | ICD-10-CM | POA: Diagnosis not present

## 2017-11-25 DIAGNOSIS — E1142 Type 2 diabetes mellitus with diabetic polyneuropathy: Secondary | ICD-10-CM | POA: Diagnosis not present

## 2017-11-25 DIAGNOSIS — I2510 Atherosclerotic heart disease of native coronary artery without angina pectoris: Secondary | ICD-10-CM | POA: Diagnosis not present

## 2017-11-26 DIAGNOSIS — M109 Gout, unspecified: Secondary | ICD-10-CM | POA: Diagnosis not present

## 2017-11-26 DIAGNOSIS — I2510 Atherosclerotic heart disease of native coronary artery without angina pectoris: Secondary | ICD-10-CM | POA: Diagnosis not present

## 2017-11-26 DIAGNOSIS — E1142 Type 2 diabetes mellitus with diabetic polyneuropathy: Secondary | ICD-10-CM | POA: Diagnosis not present

## 2017-11-26 DIAGNOSIS — E1122 Type 2 diabetes mellitus with diabetic chronic kidney disease: Secondary | ICD-10-CM | POA: Diagnosis not present

## 2017-11-26 DIAGNOSIS — N183 Chronic kidney disease, stage 3 (moderate): Secondary | ICD-10-CM | POA: Diagnosis not present

## 2017-11-26 DIAGNOSIS — I129 Hypertensive chronic kidney disease with stage 1 through stage 4 chronic kidney disease, or unspecified chronic kidney disease: Secondary | ICD-10-CM | POA: Diagnosis not present

## 2017-11-30 DIAGNOSIS — M109 Gout, unspecified: Secondary | ICD-10-CM | POA: Diagnosis not present

## 2017-11-30 DIAGNOSIS — N183 Chronic kidney disease, stage 3 (moderate): Secondary | ICD-10-CM | POA: Diagnosis not present

## 2017-11-30 DIAGNOSIS — E1122 Type 2 diabetes mellitus with diabetic chronic kidney disease: Secondary | ICD-10-CM | POA: Diagnosis not present

## 2017-11-30 DIAGNOSIS — I2510 Atherosclerotic heart disease of native coronary artery without angina pectoris: Secondary | ICD-10-CM | POA: Diagnosis not present

## 2017-11-30 DIAGNOSIS — I129 Hypertensive chronic kidney disease with stage 1 through stage 4 chronic kidney disease, or unspecified chronic kidney disease: Secondary | ICD-10-CM | POA: Diagnosis not present

## 2017-11-30 DIAGNOSIS — E1142 Type 2 diabetes mellitus with diabetic polyneuropathy: Secondary | ICD-10-CM | POA: Diagnosis not present

## 2017-12-01 DIAGNOSIS — I129 Hypertensive chronic kidney disease with stage 1 through stage 4 chronic kidney disease, or unspecified chronic kidney disease: Secondary | ICD-10-CM | POA: Diagnosis not present

## 2017-12-01 DIAGNOSIS — E1142 Type 2 diabetes mellitus with diabetic polyneuropathy: Secondary | ICD-10-CM | POA: Diagnosis not present

## 2017-12-01 DIAGNOSIS — M109 Gout, unspecified: Secondary | ICD-10-CM | POA: Diagnosis not present

## 2017-12-01 DIAGNOSIS — I2510 Atherosclerotic heart disease of native coronary artery without angina pectoris: Secondary | ICD-10-CM | POA: Diagnosis not present

## 2017-12-01 DIAGNOSIS — E1122 Type 2 diabetes mellitus with diabetic chronic kidney disease: Secondary | ICD-10-CM | POA: Diagnosis not present

## 2017-12-01 DIAGNOSIS — N183 Chronic kidney disease, stage 3 (moderate): Secondary | ICD-10-CM | POA: Diagnosis not present

## 2017-12-02 DIAGNOSIS — I129 Hypertensive chronic kidney disease with stage 1 through stage 4 chronic kidney disease, or unspecified chronic kidney disease: Secondary | ICD-10-CM | POA: Diagnosis not present

## 2017-12-02 DIAGNOSIS — E1122 Type 2 diabetes mellitus with diabetic chronic kidney disease: Secondary | ICD-10-CM | POA: Diagnosis not present

## 2017-12-02 DIAGNOSIS — E1142 Type 2 diabetes mellitus with diabetic polyneuropathy: Secondary | ICD-10-CM | POA: Diagnosis not present

## 2017-12-02 DIAGNOSIS — I2510 Atherosclerotic heart disease of native coronary artery without angina pectoris: Secondary | ICD-10-CM | POA: Diagnosis not present

## 2017-12-02 DIAGNOSIS — M109 Gout, unspecified: Secondary | ICD-10-CM | POA: Diagnosis not present

## 2017-12-02 DIAGNOSIS — N183 Chronic kidney disease, stage 3 (moderate): Secondary | ICD-10-CM | POA: Diagnosis not present

## 2017-12-04 DIAGNOSIS — F331 Major depressive disorder, recurrent, moderate: Secondary | ICD-10-CM | POA: Diagnosis not present

## 2017-12-04 DIAGNOSIS — E1122 Type 2 diabetes mellitus with diabetic chronic kidney disease: Secondary | ICD-10-CM | POA: Diagnosis not present

## 2017-12-04 DIAGNOSIS — E1142 Type 2 diabetes mellitus with diabetic polyneuropathy: Secondary | ICD-10-CM | POA: Diagnosis not present

## 2017-12-04 DIAGNOSIS — F0280 Dementia in other diseases classified elsewhere without behavioral disturbance: Secondary | ICD-10-CM | POA: Diagnosis not present

## 2017-12-04 DIAGNOSIS — N183 Chronic kidney disease, stage 3 (moderate): Secondary | ICD-10-CM | POA: Diagnosis not present

## 2017-12-04 DIAGNOSIS — I129 Hypertensive chronic kidney disease with stage 1 through stage 4 chronic kidney disease, or unspecified chronic kidney disease: Secondary | ICD-10-CM | POA: Diagnosis not present

## 2017-12-04 DIAGNOSIS — I2510 Atherosclerotic heart disease of native coronary artery without angina pectoris: Secondary | ICD-10-CM | POA: Diagnosis not present

## 2017-12-04 DIAGNOSIS — M109 Gout, unspecified: Secondary | ICD-10-CM | POA: Diagnosis not present

## 2017-12-04 DIAGNOSIS — G301 Alzheimer's disease with late onset: Secondary | ICD-10-CM | POA: Diagnosis not present

## 2017-12-07 DIAGNOSIS — E1122 Type 2 diabetes mellitus with diabetic chronic kidney disease: Secondary | ICD-10-CM | POA: Diagnosis not present

## 2017-12-07 DIAGNOSIS — N183 Chronic kidney disease, stage 3 (moderate): Secondary | ICD-10-CM | POA: Diagnosis not present

## 2017-12-07 DIAGNOSIS — I129 Hypertensive chronic kidney disease with stage 1 through stage 4 chronic kidney disease, or unspecified chronic kidney disease: Secondary | ICD-10-CM | POA: Diagnosis not present

## 2017-12-07 DIAGNOSIS — M109 Gout, unspecified: Secondary | ICD-10-CM | POA: Diagnosis not present

## 2017-12-07 DIAGNOSIS — E1142 Type 2 diabetes mellitus with diabetic polyneuropathy: Secondary | ICD-10-CM | POA: Diagnosis not present

## 2017-12-07 DIAGNOSIS — I2510 Atherosclerotic heart disease of native coronary artery without angina pectoris: Secondary | ICD-10-CM | POA: Diagnosis not present

## 2017-12-09 DIAGNOSIS — E1122 Type 2 diabetes mellitus with diabetic chronic kidney disease: Secondary | ICD-10-CM | POA: Diagnosis not present

## 2017-12-09 DIAGNOSIS — M109 Gout, unspecified: Secondary | ICD-10-CM | POA: Diagnosis not present

## 2017-12-09 DIAGNOSIS — I2510 Atherosclerotic heart disease of native coronary artery without angina pectoris: Secondary | ICD-10-CM | POA: Diagnosis not present

## 2017-12-09 DIAGNOSIS — E1142 Type 2 diabetes mellitus with diabetic polyneuropathy: Secondary | ICD-10-CM | POA: Diagnosis not present

## 2017-12-09 DIAGNOSIS — N183 Chronic kidney disease, stage 3 (moderate): Secondary | ICD-10-CM | POA: Diagnosis not present

## 2017-12-09 DIAGNOSIS — I129 Hypertensive chronic kidney disease with stage 1 through stage 4 chronic kidney disease, or unspecified chronic kidney disease: Secondary | ICD-10-CM | POA: Diagnosis not present

## 2017-12-15 DIAGNOSIS — E1122 Type 2 diabetes mellitus with diabetic chronic kidney disease: Secondary | ICD-10-CM | POA: Diagnosis not present

## 2017-12-15 DIAGNOSIS — F331 Major depressive disorder, recurrent, moderate: Secondary | ICD-10-CM | POA: Diagnosis not present

## 2017-12-15 DIAGNOSIS — F0280 Dementia in other diseases classified elsewhere without behavioral disturbance: Secondary | ICD-10-CM | POA: Diagnosis not present

## 2017-12-15 DIAGNOSIS — I2510 Atherosclerotic heart disease of native coronary artery without angina pectoris: Secondary | ICD-10-CM | POA: Diagnosis not present

## 2017-12-15 DIAGNOSIS — I129 Hypertensive chronic kidney disease with stage 1 through stage 4 chronic kidney disease, or unspecified chronic kidney disease: Secondary | ICD-10-CM | POA: Diagnosis not present

## 2017-12-15 DIAGNOSIS — E1142 Type 2 diabetes mellitus with diabetic polyneuropathy: Secondary | ICD-10-CM | POA: Diagnosis not present

## 2017-12-15 DIAGNOSIS — M109 Gout, unspecified: Secondary | ICD-10-CM | POA: Diagnosis not present

## 2017-12-15 DIAGNOSIS — N183 Chronic kidney disease, stage 3 (moderate): Secondary | ICD-10-CM | POA: Diagnosis not present

## 2017-12-17 DIAGNOSIS — I2510 Atherosclerotic heart disease of native coronary artery without angina pectoris: Secondary | ICD-10-CM | POA: Diagnosis not present

## 2017-12-17 DIAGNOSIS — M109 Gout, unspecified: Secondary | ICD-10-CM | POA: Diagnosis not present

## 2017-12-17 DIAGNOSIS — I129 Hypertensive chronic kidney disease with stage 1 through stage 4 chronic kidney disease, or unspecified chronic kidney disease: Secondary | ICD-10-CM | POA: Diagnosis not present

## 2017-12-17 DIAGNOSIS — N183 Chronic kidney disease, stage 3 (moderate): Secondary | ICD-10-CM | POA: Diagnosis not present

## 2017-12-17 DIAGNOSIS — E1122 Type 2 diabetes mellitus with diabetic chronic kidney disease: Secondary | ICD-10-CM | POA: Diagnosis not present

## 2017-12-17 DIAGNOSIS — E1142 Type 2 diabetes mellitus with diabetic polyneuropathy: Secondary | ICD-10-CM | POA: Diagnosis not present

## 2017-12-21 DIAGNOSIS — E1122 Type 2 diabetes mellitus with diabetic chronic kidney disease: Secondary | ICD-10-CM | POA: Diagnosis not present

## 2017-12-21 DIAGNOSIS — I2510 Atherosclerotic heart disease of native coronary artery without angina pectoris: Secondary | ICD-10-CM | POA: Diagnosis not present

## 2017-12-21 DIAGNOSIS — N183 Chronic kidney disease, stage 3 (moderate): Secondary | ICD-10-CM | POA: Diagnosis not present

## 2017-12-21 DIAGNOSIS — I129 Hypertensive chronic kidney disease with stage 1 through stage 4 chronic kidney disease, or unspecified chronic kidney disease: Secondary | ICD-10-CM | POA: Diagnosis not present

## 2017-12-21 DIAGNOSIS — M109 Gout, unspecified: Secondary | ICD-10-CM | POA: Diagnosis not present

## 2017-12-21 DIAGNOSIS — E1142 Type 2 diabetes mellitus with diabetic polyneuropathy: Secondary | ICD-10-CM | POA: Diagnosis not present

## 2017-12-23 DIAGNOSIS — I129 Hypertensive chronic kidney disease with stage 1 through stage 4 chronic kidney disease, or unspecified chronic kidney disease: Secondary | ICD-10-CM | POA: Diagnosis not present

## 2017-12-23 DIAGNOSIS — E1142 Type 2 diabetes mellitus with diabetic polyneuropathy: Secondary | ICD-10-CM | POA: Diagnosis not present

## 2017-12-23 DIAGNOSIS — E1122 Type 2 diabetes mellitus with diabetic chronic kidney disease: Secondary | ICD-10-CM | POA: Diagnosis not present

## 2017-12-23 DIAGNOSIS — M109 Gout, unspecified: Secondary | ICD-10-CM | POA: Diagnosis not present

## 2017-12-23 DIAGNOSIS — N183 Chronic kidney disease, stage 3 (moderate): Secondary | ICD-10-CM | POA: Diagnosis not present

## 2017-12-23 DIAGNOSIS — I2510 Atherosclerotic heart disease of native coronary artery without angina pectoris: Secondary | ICD-10-CM | POA: Diagnosis not present

## 2017-12-25 DIAGNOSIS — E559 Vitamin D deficiency, unspecified: Secondary | ICD-10-CM | POA: Diagnosis not present

## 2017-12-25 DIAGNOSIS — I10 Essential (primary) hypertension: Secondary | ICD-10-CM | POA: Diagnosis not present

## 2017-12-25 DIAGNOSIS — K219 Gastro-esophageal reflux disease without esophagitis: Secondary | ICD-10-CM | POA: Diagnosis not present

## 2017-12-25 DIAGNOSIS — N39498 Other specified urinary incontinence: Secondary | ICD-10-CM | POA: Diagnosis not present

## 2017-12-25 DIAGNOSIS — E782 Mixed hyperlipidemia: Secondary | ICD-10-CM | POA: Diagnosis not present

## 2017-12-25 DIAGNOSIS — M1000 Idiopathic gout, unspecified site: Secondary | ICD-10-CM | POA: Diagnosis not present

## 2017-12-25 DIAGNOSIS — R269 Unspecified abnormalities of gait and mobility: Secondary | ICD-10-CM | POA: Diagnosis not present

## 2017-12-25 DIAGNOSIS — F339 Major depressive disorder, recurrent, unspecified: Secondary | ICD-10-CM | POA: Diagnosis not present

## 2017-12-28 DIAGNOSIS — I739 Peripheral vascular disease, unspecified: Secondary | ICD-10-CM | POA: Diagnosis not present

## 2017-12-28 DIAGNOSIS — E1122 Type 2 diabetes mellitus with diabetic chronic kidney disease: Secondary | ICD-10-CM | POA: Diagnosis not present

## 2017-12-28 DIAGNOSIS — I129 Hypertensive chronic kidney disease with stage 1 through stage 4 chronic kidney disease, or unspecified chronic kidney disease: Secondary | ICD-10-CM | POA: Diagnosis not present

## 2017-12-28 DIAGNOSIS — Q845 Enlarged and hypertrophic nails: Secondary | ICD-10-CM | POA: Diagnosis not present

## 2017-12-28 DIAGNOSIS — M109 Gout, unspecified: Secondary | ICD-10-CM | POA: Diagnosis not present

## 2017-12-28 DIAGNOSIS — B351 Tinea unguium: Secondary | ICD-10-CM | POA: Diagnosis not present

## 2017-12-28 DIAGNOSIS — N183 Chronic kidney disease, stage 3 (moderate): Secondary | ICD-10-CM | POA: Diagnosis not present

## 2017-12-28 DIAGNOSIS — E1142 Type 2 diabetes mellitus with diabetic polyneuropathy: Secondary | ICD-10-CM | POA: Diagnosis not present

## 2017-12-28 DIAGNOSIS — I2510 Atherosclerotic heart disease of native coronary artery without angina pectoris: Secondary | ICD-10-CM | POA: Diagnosis not present

## 2017-12-28 DIAGNOSIS — L603 Nail dystrophy: Secondary | ICD-10-CM | POA: Diagnosis not present

## 2017-12-29 DIAGNOSIS — I2510 Atherosclerotic heart disease of native coronary artery without angina pectoris: Secondary | ICD-10-CM | POA: Diagnosis not present

## 2017-12-29 DIAGNOSIS — I129 Hypertensive chronic kidney disease with stage 1 through stage 4 chronic kidney disease, or unspecified chronic kidney disease: Secondary | ICD-10-CM | POA: Diagnosis not present

## 2017-12-29 DIAGNOSIS — M109 Gout, unspecified: Secondary | ICD-10-CM | POA: Diagnosis not present

## 2017-12-29 DIAGNOSIS — E1142 Type 2 diabetes mellitus with diabetic polyneuropathy: Secondary | ICD-10-CM | POA: Diagnosis not present

## 2017-12-29 DIAGNOSIS — E1122 Type 2 diabetes mellitus with diabetic chronic kidney disease: Secondary | ICD-10-CM | POA: Diagnosis not present

## 2017-12-29 DIAGNOSIS — N183 Chronic kidney disease, stage 3 (moderate): Secondary | ICD-10-CM | POA: Diagnosis not present

## 2017-12-29 DIAGNOSIS — N39498 Other specified urinary incontinence: Secondary | ICD-10-CM | POA: Diagnosis not present

## 2017-12-30 DIAGNOSIS — M109 Gout, unspecified: Secondary | ICD-10-CM | POA: Diagnosis not present

## 2017-12-30 DIAGNOSIS — I129 Hypertensive chronic kidney disease with stage 1 through stage 4 chronic kidney disease, or unspecified chronic kidney disease: Secondary | ICD-10-CM | POA: Diagnosis not present

## 2017-12-30 DIAGNOSIS — N183 Chronic kidney disease, stage 3 (moderate): Secondary | ICD-10-CM | POA: Diagnosis not present

## 2017-12-30 DIAGNOSIS — E1122 Type 2 diabetes mellitus with diabetic chronic kidney disease: Secondary | ICD-10-CM | POA: Diagnosis not present

## 2017-12-30 DIAGNOSIS — I2510 Atherosclerotic heart disease of native coronary artery without angina pectoris: Secondary | ICD-10-CM | POA: Diagnosis not present

## 2017-12-30 DIAGNOSIS — E1142 Type 2 diabetes mellitus with diabetic polyneuropathy: Secondary | ICD-10-CM | POA: Diagnosis not present

## 2018-01-01 DIAGNOSIS — F331 Major depressive disorder, recurrent, moderate: Secondary | ICD-10-CM | POA: Diagnosis not present

## 2018-01-01 DIAGNOSIS — E1142 Type 2 diabetes mellitus with diabetic polyneuropathy: Secondary | ICD-10-CM | POA: Diagnosis not present

## 2018-01-01 DIAGNOSIS — M109 Gout, unspecified: Secondary | ICD-10-CM | POA: Diagnosis not present

## 2018-01-01 DIAGNOSIS — I129 Hypertensive chronic kidney disease with stage 1 through stage 4 chronic kidney disease, or unspecified chronic kidney disease: Secondary | ICD-10-CM | POA: Diagnosis not present

## 2018-01-01 DIAGNOSIS — E1122 Type 2 diabetes mellitus with diabetic chronic kidney disease: Secondary | ICD-10-CM | POA: Diagnosis not present

## 2018-01-01 DIAGNOSIS — F0280 Dementia in other diseases classified elsewhere without behavioral disturbance: Secondary | ICD-10-CM | POA: Diagnosis not present

## 2018-01-01 DIAGNOSIS — I2510 Atherosclerotic heart disease of native coronary artery without angina pectoris: Secondary | ICD-10-CM | POA: Diagnosis not present

## 2018-01-01 DIAGNOSIS — N390 Urinary tract infection, site not specified: Secondary | ICD-10-CM | POA: Diagnosis not present

## 2018-01-01 DIAGNOSIS — G301 Alzheimer's disease with late onset: Secondary | ICD-10-CM | POA: Diagnosis not present

## 2018-01-01 DIAGNOSIS — N183 Chronic kidney disease, stage 3 (moderate): Secondary | ICD-10-CM | POA: Diagnosis not present

## 2018-01-01 DIAGNOSIS — Z5181 Encounter for therapeutic drug level monitoring: Secondary | ICD-10-CM | POA: Diagnosis not present

## 2018-01-04 DIAGNOSIS — N183 Chronic kidney disease, stage 3 (moderate): Secondary | ICD-10-CM | POA: Diagnosis not present

## 2018-01-04 DIAGNOSIS — E1142 Type 2 diabetes mellitus with diabetic polyneuropathy: Secondary | ICD-10-CM | POA: Diagnosis not present

## 2018-01-04 DIAGNOSIS — E1122 Type 2 diabetes mellitus with diabetic chronic kidney disease: Secondary | ICD-10-CM | POA: Diagnosis not present

## 2018-01-04 DIAGNOSIS — I2510 Atherosclerotic heart disease of native coronary artery without angina pectoris: Secondary | ICD-10-CM | POA: Diagnosis not present

## 2018-01-04 DIAGNOSIS — I129 Hypertensive chronic kidney disease with stage 1 through stage 4 chronic kidney disease, or unspecified chronic kidney disease: Secondary | ICD-10-CM | POA: Diagnosis not present

## 2018-01-04 DIAGNOSIS — M109 Gout, unspecified: Secondary | ICD-10-CM | POA: Diagnosis not present

## 2018-01-05 DIAGNOSIS — I2510 Atherosclerotic heart disease of native coronary artery without angina pectoris: Secondary | ICD-10-CM | POA: Diagnosis not present

## 2018-01-05 DIAGNOSIS — M109 Gout, unspecified: Secondary | ICD-10-CM | POA: Diagnosis not present

## 2018-01-05 DIAGNOSIS — N183 Chronic kidney disease, stage 3 (moderate): Secondary | ICD-10-CM | POA: Diagnosis not present

## 2018-01-05 DIAGNOSIS — I129 Hypertensive chronic kidney disease with stage 1 through stage 4 chronic kidney disease, or unspecified chronic kidney disease: Secondary | ICD-10-CM | POA: Diagnosis not present

## 2018-01-05 DIAGNOSIS — E1142 Type 2 diabetes mellitus with diabetic polyneuropathy: Secondary | ICD-10-CM | POA: Diagnosis not present

## 2018-01-05 DIAGNOSIS — E1122 Type 2 diabetes mellitus with diabetic chronic kidney disease: Secondary | ICD-10-CM | POA: Diagnosis not present

## 2018-01-06 DIAGNOSIS — E1122 Type 2 diabetes mellitus with diabetic chronic kidney disease: Secondary | ICD-10-CM | POA: Diagnosis not present

## 2018-01-06 DIAGNOSIS — I2510 Atherosclerotic heart disease of native coronary artery without angina pectoris: Secondary | ICD-10-CM | POA: Diagnosis not present

## 2018-01-06 DIAGNOSIS — I129 Hypertensive chronic kidney disease with stage 1 through stage 4 chronic kidney disease, or unspecified chronic kidney disease: Secondary | ICD-10-CM | POA: Diagnosis not present

## 2018-01-06 DIAGNOSIS — N183 Chronic kidney disease, stage 3 (moderate): Secondary | ICD-10-CM | POA: Diagnosis not present

## 2018-01-06 DIAGNOSIS — E1142 Type 2 diabetes mellitus with diabetic polyneuropathy: Secondary | ICD-10-CM | POA: Diagnosis not present

## 2018-01-06 DIAGNOSIS — M109 Gout, unspecified: Secondary | ICD-10-CM | POA: Diagnosis not present

## 2018-01-07 DIAGNOSIS — M109 Gout, unspecified: Secondary | ICD-10-CM | POA: Diagnosis not present

## 2018-01-07 DIAGNOSIS — I129 Hypertensive chronic kidney disease with stage 1 through stage 4 chronic kidney disease, or unspecified chronic kidney disease: Secondary | ICD-10-CM | POA: Diagnosis not present

## 2018-01-07 DIAGNOSIS — E1142 Type 2 diabetes mellitus with diabetic polyneuropathy: Secondary | ICD-10-CM | POA: Diagnosis not present

## 2018-01-07 DIAGNOSIS — I2510 Atherosclerotic heart disease of native coronary artery without angina pectoris: Secondary | ICD-10-CM | POA: Diagnosis not present

## 2018-01-07 DIAGNOSIS — N183 Chronic kidney disease, stage 3 (moderate): Secondary | ICD-10-CM | POA: Diagnosis not present

## 2018-01-07 DIAGNOSIS — E1122 Type 2 diabetes mellitus with diabetic chronic kidney disease: Secondary | ICD-10-CM | POA: Diagnosis not present

## 2018-01-08 DIAGNOSIS — N390 Urinary tract infection, site not specified: Secondary | ICD-10-CM | POA: Diagnosis not present

## 2018-01-11 DIAGNOSIS — M109 Gout, unspecified: Secondary | ICD-10-CM | POA: Diagnosis not present

## 2018-01-11 DIAGNOSIS — E1142 Type 2 diabetes mellitus with diabetic polyneuropathy: Secondary | ICD-10-CM | POA: Diagnosis not present

## 2018-01-11 DIAGNOSIS — N183 Chronic kidney disease, stage 3 (moderate): Secondary | ICD-10-CM | POA: Diagnosis not present

## 2018-01-11 DIAGNOSIS — I129 Hypertensive chronic kidney disease with stage 1 through stage 4 chronic kidney disease, or unspecified chronic kidney disease: Secondary | ICD-10-CM | POA: Diagnosis not present

## 2018-01-11 DIAGNOSIS — I2510 Atherosclerotic heart disease of native coronary artery without angina pectoris: Secondary | ICD-10-CM | POA: Diagnosis not present

## 2018-01-11 DIAGNOSIS — E1122 Type 2 diabetes mellitus with diabetic chronic kidney disease: Secondary | ICD-10-CM | POA: Diagnosis not present

## 2018-01-13 DIAGNOSIS — N183 Chronic kidney disease, stage 3 (moderate): Secondary | ICD-10-CM | POA: Diagnosis not present

## 2018-01-13 DIAGNOSIS — E1122 Type 2 diabetes mellitus with diabetic chronic kidney disease: Secondary | ICD-10-CM | POA: Diagnosis not present

## 2018-01-13 DIAGNOSIS — Z5181 Encounter for therapeutic drug level monitoring: Secondary | ICD-10-CM | POA: Diagnosis not present

## 2018-01-13 DIAGNOSIS — I2510 Atherosclerotic heart disease of native coronary artery without angina pectoris: Secondary | ICD-10-CM | POA: Diagnosis not present

## 2018-01-13 DIAGNOSIS — E1142 Type 2 diabetes mellitus with diabetic polyneuropathy: Secondary | ICD-10-CM | POA: Diagnosis not present

## 2018-01-13 DIAGNOSIS — M109 Gout, unspecified: Secondary | ICD-10-CM | POA: Diagnosis not present

## 2018-01-13 DIAGNOSIS — I129 Hypertensive chronic kidney disease with stage 1 through stage 4 chronic kidney disease, or unspecified chronic kidney disease: Secondary | ICD-10-CM | POA: Diagnosis not present

## 2018-01-15 DIAGNOSIS — N39498 Other specified urinary incontinence: Secondary | ICD-10-CM | POA: Diagnosis not present

## 2018-01-15 DIAGNOSIS — E782 Mixed hyperlipidemia: Secondary | ICD-10-CM | POA: Diagnosis not present

## 2018-01-15 DIAGNOSIS — M109 Gout, unspecified: Secondary | ICD-10-CM | POA: Diagnosis not present

## 2018-01-15 DIAGNOSIS — I10 Essential (primary) hypertension: Secondary | ICD-10-CM | POA: Diagnosis not present

## 2018-01-15 DIAGNOSIS — E1122 Type 2 diabetes mellitus with diabetic chronic kidney disease: Secondary | ICD-10-CM | POA: Diagnosis not present

## 2018-01-15 DIAGNOSIS — I129 Hypertensive chronic kidney disease with stage 1 through stage 4 chronic kidney disease, or unspecified chronic kidney disease: Secondary | ICD-10-CM | POA: Diagnosis not present

## 2018-01-15 DIAGNOSIS — R269 Unspecified abnormalities of gait and mobility: Secondary | ICD-10-CM | POA: Diagnosis not present

## 2018-01-15 DIAGNOSIS — I2510 Atherosclerotic heart disease of native coronary artery without angina pectoris: Secondary | ICD-10-CM | POA: Diagnosis not present

## 2018-01-15 DIAGNOSIS — E1142 Type 2 diabetes mellitus with diabetic polyneuropathy: Secondary | ICD-10-CM | POA: Diagnosis not present

## 2018-01-15 DIAGNOSIS — F339 Major depressive disorder, recurrent, unspecified: Secondary | ICD-10-CM | POA: Diagnosis not present

## 2018-01-15 DIAGNOSIS — N183 Chronic kidney disease, stage 3 (moderate): Secondary | ICD-10-CM | POA: Diagnosis not present

## 2018-01-15 DIAGNOSIS — K219 Gastro-esophageal reflux disease without esophagitis: Secondary | ICD-10-CM | POA: Diagnosis not present

## 2018-01-15 DIAGNOSIS — E559 Vitamin D deficiency, unspecified: Secondary | ICD-10-CM | POA: Diagnosis not present

## 2018-01-15 DIAGNOSIS — M1000 Idiopathic gout, unspecified site: Secondary | ICD-10-CM | POA: Diagnosis not present

## 2018-01-19 DIAGNOSIS — I2510 Atherosclerotic heart disease of native coronary artery without angina pectoris: Secondary | ICD-10-CM | POA: Diagnosis not present

## 2018-01-19 DIAGNOSIS — N183 Chronic kidney disease, stage 3 (moderate): Secondary | ICD-10-CM | POA: Diagnosis not present

## 2018-01-19 DIAGNOSIS — E1122 Type 2 diabetes mellitus with diabetic chronic kidney disease: Secondary | ICD-10-CM | POA: Diagnosis not present

## 2018-01-19 DIAGNOSIS — I129 Hypertensive chronic kidney disease with stage 1 through stage 4 chronic kidney disease, or unspecified chronic kidney disease: Secondary | ICD-10-CM | POA: Diagnosis not present

## 2018-01-19 DIAGNOSIS — Z9181 History of falling: Secondary | ICD-10-CM | POA: Diagnosis not present

## 2018-01-19 DIAGNOSIS — F329 Major depressive disorder, single episode, unspecified: Secondary | ICD-10-CM | POA: Diagnosis not present

## 2018-01-19 DIAGNOSIS — E1142 Type 2 diabetes mellitus with diabetic polyneuropathy: Secondary | ICD-10-CM | POA: Diagnosis not present

## 2018-01-19 DIAGNOSIS — M109 Gout, unspecified: Secondary | ICD-10-CM | POA: Diagnosis not present

## 2018-01-21 DIAGNOSIS — E1142 Type 2 diabetes mellitus with diabetic polyneuropathy: Secondary | ICD-10-CM | POA: Diagnosis not present

## 2018-01-21 DIAGNOSIS — N183 Chronic kidney disease, stage 3 (moderate): Secondary | ICD-10-CM | POA: Diagnosis not present

## 2018-01-21 DIAGNOSIS — E1122 Type 2 diabetes mellitus with diabetic chronic kidney disease: Secondary | ICD-10-CM | POA: Diagnosis not present

## 2018-01-21 DIAGNOSIS — M109 Gout, unspecified: Secondary | ICD-10-CM | POA: Diagnosis not present

## 2018-01-21 DIAGNOSIS — I129 Hypertensive chronic kidney disease with stage 1 through stage 4 chronic kidney disease, or unspecified chronic kidney disease: Secondary | ICD-10-CM | POA: Diagnosis not present

## 2018-01-21 DIAGNOSIS — I2510 Atherosclerotic heart disease of native coronary artery without angina pectoris: Secondary | ICD-10-CM | POA: Diagnosis not present

## 2018-01-26 DIAGNOSIS — E1142 Type 2 diabetes mellitus with diabetic polyneuropathy: Secondary | ICD-10-CM | POA: Diagnosis not present

## 2018-01-26 DIAGNOSIS — I129 Hypertensive chronic kidney disease with stage 1 through stage 4 chronic kidney disease, or unspecified chronic kidney disease: Secondary | ICD-10-CM | POA: Diagnosis not present

## 2018-01-26 DIAGNOSIS — N183 Chronic kidney disease, stage 3 (moderate): Secondary | ICD-10-CM | POA: Diagnosis not present

## 2018-01-26 DIAGNOSIS — E1122 Type 2 diabetes mellitus with diabetic chronic kidney disease: Secondary | ICD-10-CM | POA: Diagnosis not present

## 2018-01-26 DIAGNOSIS — I2510 Atherosclerotic heart disease of native coronary artery without angina pectoris: Secondary | ICD-10-CM | POA: Diagnosis not present

## 2018-01-26 DIAGNOSIS — M109 Gout, unspecified: Secondary | ICD-10-CM | POA: Diagnosis not present

## 2018-01-28 DIAGNOSIS — M109 Gout, unspecified: Secondary | ICD-10-CM | POA: Diagnosis not present

## 2018-01-28 DIAGNOSIS — I2510 Atherosclerotic heart disease of native coronary artery without angina pectoris: Secondary | ICD-10-CM | POA: Diagnosis not present

## 2018-01-28 DIAGNOSIS — E1142 Type 2 diabetes mellitus with diabetic polyneuropathy: Secondary | ICD-10-CM | POA: Diagnosis not present

## 2018-01-28 DIAGNOSIS — E1122 Type 2 diabetes mellitus with diabetic chronic kidney disease: Secondary | ICD-10-CM | POA: Diagnosis not present

## 2018-01-28 DIAGNOSIS — N183 Chronic kidney disease, stage 3 (moderate): Secondary | ICD-10-CM | POA: Diagnosis not present

## 2018-01-28 DIAGNOSIS — I129 Hypertensive chronic kidney disease with stage 1 through stage 4 chronic kidney disease, or unspecified chronic kidney disease: Secondary | ICD-10-CM | POA: Diagnosis not present

## 2018-01-29 DIAGNOSIS — F331 Major depressive disorder, recurrent, moderate: Secondary | ICD-10-CM | POA: Diagnosis not present

## 2018-01-29 DIAGNOSIS — R269 Unspecified abnormalities of gait and mobility: Secondary | ICD-10-CM | POA: Diagnosis not present

## 2018-01-29 DIAGNOSIS — F339 Major depressive disorder, recurrent, unspecified: Secondary | ICD-10-CM | POA: Diagnosis not present

## 2018-01-29 DIAGNOSIS — F0280 Dementia in other diseases classified elsewhere without behavioral disturbance: Secondary | ICD-10-CM | POA: Diagnosis not present

## 2018-01-29 DIAGNOSIS — M1000 Idiopathic gout, unspecified site: Secondary | ICD-10-CM | POA: Diagnosis not present

## 2018-01-29 DIAGNOSIS — I10 Essential (primary) hypertension: Secondary | ICD-10-CM | POA: Diagnosis not present

## 2018-01-29 DIAGNOSIS — K219 Gastro-esophageal reflux disease without esophagitis: Secondary | ICD-10-CM | POA: Diagnosis not present

## 2018-01-29 DIAGNOSIS — E559 Vitamin D deficiency, unspecified: Secondary | ICD-10-CM | POA: Diagnosis not present

## 2018-01-29 DIAGNOSIS — N390 Urinary tract infection, site not specified: Secondary | ICD-10-CM | POA: Diagnosis not present

## 2018-01-29 DIAGNOSIS — E782 Mixed hyperlipidemia: Secondary | ICD-10-CM | POA: Diagnosis not present

## 2018-01-29 DIAGNOSIS — G301 Alzheimer's disease with late onset: Secondary | ICD-10-CM | POA: Diagnosis not present

## 2018-01-29 DIAGNOSIS — N39498 Other specified urinary incontinence: Secondary | ICD-10-CM | POA: Diagnosis not present

## 2018-01-29 DIAGNOSIS — Z5181 Encounter for therapeutic drug level monitoring: Secondary | ICD-10-CM | POA: Diagnosis not present

## 2018-02-02 DIAGNOSIS — E1122 Type 2 diabetes mellitus with diabetic chronic kidney disease: Secondary | ICD-10-CM | POA: Diagnosis not present

## 2018-02-02 DIAGNOSIS — I2510 Atherosclerotic heart disease of native coronary artery without angina pectoris: Secondary | ICD-10-CM | POA: Diagnosis not present

## 2018-02-02 DIAGNOSIS — N183 Chronic kidney disease, stage 3 (moderate): Secondary | ICD-10-CM | POA: Diagnosis not present

## 2018-02-02 DIAGNOSIS — E1142 Type 2 diabetes mellitus with diabetic polyneuropathy: Secondary | ICD-10-CM | POA: Diagnosis not present

## 2018-02-02 DIAGNOSIS — R4182 Altered mental status, unspecified: Secondary | ICD-10-CM | POA: Diagnosis not present

## 2018-02-02 DIAGNOSIS — M109 Gout, unspecified: Secondary | ICD-10-CM | POA: Diagnosis not present

## 2018-02-02 DIAGNOSIS — I129 Hypertensive chronic kidney disease with stage 1 through stage 4 chronic kidney disease, or unspecified chronic kidney disease: Secondary | ICD-10-CM | POA: Diagnosis not present

## 2018-02-04 DIAGNOSIS — E1142 Type 2 diabetes mellitus with diabetic polyneuropathy: Secondary | ICD-10-CM | POA: Diagnosis not present

## 2018-02-04 DIAGNOSIS — I2510 Atherosclerotic heart disease of native coronary artery without angina pectoris: Secondary | ICD-10-CM | POA: Diagnosis not present

## 2018-02-04 DIAGNOSIS — E1122 Type 2 diabetes mellitus with diabetic chronic kidney disease: Secondary | ICD-10-CM | POA: Diagnosis not present

## 2018-02-04 DIAGNOSIS — M109 Gout, unspecified: Secondary | ICD-10-CM | POA: Diagnosis not present

## 2018-02-04 DIAGNOSIS — N183 Chronic kidney disease, stage 3 (moderate): Secondary | ICD-10-CM | POA: Diagnosis not present

## 2018-02-04 DIAGNOSIS — I129 Hypertensive chronic kidney disease with stage 1 through stage 4 chronic kidney disease, or unspecified chronic kidney disease: Secondary | ICD-10-CM | POA: Diagnosis not present

## 2018-02-08 DIAGNOSIS — E1142 Type 2 diabetes mellitus with diabetic polyneuropathy: Secondary | ICD-10-CM | POA: Diagnosis not present

## 2018-02-08 DIAGNOSIS — I129 Hypertensive chronic kidney disease with stage 1 through stage 4 chronic kidney disease, or unspecified chronic kidney disease: Secondary | ICD-10-CM | POA: Diagnosis not present

## 2018-02-08 DIAGNOSIS — I2510 Atherosclerotic heart disease of native coronary artery without angina pectoris: Secondary | ICD-10-CM | POA: Diagnosis not present

## 2018-02-08 DIAGNOSIS — M109 Gout, unspecified: Secondary | ICD-10-CM | POA: Diagnosis not present

## 2018-02-08 DIAGNOSIS — N183 Chronic kidney disease, stage 3 (moderate): Secondary | ICD-10-CM | POA: Diagnosis not present

## 2018-02-08 DIAGNOSIS — E1122 Type 2 diabetes mellitus with diabetic chronic kidney disease: Secondary | ICD-10-CM | POA: Diagnosis not present

## 2018-02-12 DIAGNOSIS — E559 Vitamin D deficiency, unspecified: Secondary | ICD-10-CM | POA: Diagnosis not present

## 2018-02-12 DIAGNOSIS — N39498 Other specified urinary incontinence: Secondary | ICD-10-CM | POA: Diagnosis not present

## 2018-02-12 DIAGNOSIS — F339 Major depressive disorder, recurrent, unspecified: Secondary | ICD-10-CM | POA: Diagnosis not present

## 2018-02-12 DIAGNOSIS — M1000 Idiopathic gout, unspecified site: Secondary | ICD-10-CM | POA: Diagnosis not present

## 2018-02-12 DIAGNOSIS — E1142 Type 2 diabetes mellitus with diabetic polyneuropathy: Secondary | ICD-10-CM | POA: Diagnosis not present

## 2018-02-12 DIAGNOSIS — I129 Hypertensive chronic kidney disease with stage 1 through stage 4 chronic kidney disease, or unspecified chronic kidney disease: Secondary | ICD-10-CM | POA: Diagnosis not present

## 2018-02-12 DIAGNOSIS — E1122 Type 2 diabetes mellitus with diabetic chronic kidney disease: Secondary | ICD-10-CM | POA: Diagnosis not present

## 2018-02-12 DIAGNOSIS — R269 Unspecified abnormalities of gait and mobility: Secondary | ICD-10-CM | POA: Diagnosis not present

## 2018-02-12 DIAGNOSIS — I10 Essential (primary) hypertension: Secondary | ICD-10-CM | POA: Diagnosis not present

## 2018-02-12 DIAGNOSIS — M109 Gout, unspecified: Secondary | ICD-10-CM | POA: Diagnosis not present

## 2018-02-12 DIAGNOSIS — N183 Chronic kidney disease, stage 3 (moderate): Secondary | ICD-10-CM | POA: Diagnosis not present

## 2018-02-12 DIAGNOSIS — K219 Gastro-esophageal reflux disease without esophagitis: Secondary | ICD-10-CM | POA: Diagnosis not present

## 2018-02-12 DIAGNOSIS — I2510 Atherosclerotic heart disease of native coronary artery without angina pectoris: Secondary | ICD-10-CM | POA: Diagnosis not present

## 2018-02-12 DIAGNOSIS — E782 Mixed hyperlipidemia: Secondary | ICD-10-CM | POA: Diagnosis not present

## 2018-02-15 DIAGNOSIS — I129 Hypertensive chronic kidney disease with stage 1 through stage 4 chronic kidney disease, or unspecified chronic kidney disease: Secondary | ICD-10-CM | POA: Diagnosis not present

## 2018-02-15 DIAGNOSIS — I2510 Atherosclerotic heart disease of native coronary artery without angina pectoris: Secondary | ICD-10-CM | POA: Diagnosis not present

## 2018-02-15 DIAGNOSIS — N183 Chronic kidney disease, stage 3 (moderate): Secondary | ICD-10-CM | POA: Diagnosis not present

## 2018-02-15 DIAGNOSIS — E1122 Type 2 diabetes mellitus with diabetic chronic kidney disease: Secondary | ICD-10-CM | POA: Diagnosis not present

## 2018-02-15 DIAGNOSIS — M109 Gout, unspecified: Secondary | ICD-10-CM | POA: Diagnosis not present

## 2018-02-15 DIAGNOSIS — E1142 Type 2 diabetes mellitus with diabetic polyneuropathy: Secondary | ICD-10-CM | POA: Diagnosis not present

## 2018-02-17 DIAGNOSIS — E1122 Type 2 diabetes mellitus with diabetic chronic kidney disease: Secondary | ICD-10-CM | POA: Diagnosis not present

## 2018-02-17 DIAGNOSIS — E1142 Type 2 diabetes mellitus with diabetic polyneuropathy: Secondary | ICD-10-CM | POA: Diagnosis not present

## 2018-02-17 DIAGNOSIS — I2510 Atherosclerotic heart disease of native coronary artery without angina pectoris: Secondary | ICD-10-CM | POA: Diagnosis not present

## 2018-02-17 DIAGNOSIS — M109 Gout, unspecified: Secondary | ICD-10-CM | POA: Diagnosis not present

## 2018-02-17 DIAGNOSIS — I129 Hypertensive chronic kidney disease with stage 1 through stage 4 chronic kidney disease, or unspecified chronic kidney disease: Secondary | ICD-10-CM | POA: Diagnosis not present

## 2018-02-17 DIAGNOSIS — N183 Chronic kidney disease, stage 3 (moderate): Secondary | ICD-10-CM | POA: Diagnosis not present

## 2018-02-22 DIAGNOSIS — M109 Gout, unspecified: Secondary | ICD-10-CM | POA: Diagnosis not present

## 2018-02-22 DIAGNOSIS — N183 Chronic kidney disease, stage 3 (moderate): Secondary | ICD-10-CM | POA: Diagnosis not present

## 2018-02-22 DIAGNOSIS — I2510 Atherosclerotic heart disease of native coronary artery without angina pectoris: Secondary | ICD-10-CM | POA: Diagnosis not present

## 2018-02-22 DIAGNOSIS — I129 Hypertensive chronic kidney disease with stage 1 through stage 4 chronic kidney disease, or unspecified chronic kidney disease: Secondary | ICD-10-CM | POA: Diagnosis not present

## 2018-02-22 DIAGNOSIS — E1142 Type 2 diabetes mellitus with diabetic polyneuropathy: Secondary | ICD-10-CM | POA: Diagnosis not present

## 2018-02-22 DIAGNOSIS — E1122 Type 2 diabetes mellitus with diabetic chronic kidney disease: Secondary | ICD-10-CM | POA: Diagnosis not present

## 2018-02-24 DIAGNOSIS — E1142 Type 2 diabetes mellitus with diabetic polyneuropathy: Secondary | ICD-10-CM | POA: Diagnosis not present

## 2018-02-24 DIAGNOSIS — I129 Hypertensive chronic kidney disease with stage 1 through stage 4 chronic kidney disease, or unspecified chronic kidney disease: Secondary | ICD-10-CM | POA: Diagnosis not present

## 2018-02-24 DIAGNOSIS — M109 Gout, unspecified: Secondary | ICD-10-CM | POA: Diagnosis not present

## 2018-02-24 DIAGNOSIS — N183 Chronic kidney disease, stage 3 (moderate): Secondary | ICD-10-CM | POA: Diagnosis not present

## 2018-02-24 DIAGNOSIS — E1122 Type 2 diabetes mellitus with diabetic chronic kidney disease: Secondary | ICD-10-CM | POA: Diagnosis not present

## 2018-02-24 DIAGNOSIS — I2510 Atherosclerotic heart disease of native coronary artery without angina pectoris: Secondary | ICD-10-CM | POA: Diagnosis not present

## 2018-03-01 DIAGNOSIS — E1142 Type 2 diabetes mellitus with diabetic polyneuropathy: Secondary | ICD-10-CM | POA: Diagnosis not present

## 2018-03-01 DIAGNOSIS — N183 Chronic kidney disease, stage 3 (moderate): Secondary | ICD-10-CM | POA: Diagnosis not present

## 2018-03-01 DIAGNOSIS — I2510 Atherosclerotic heart disease of native coronary artery without angina pectoris: Secondary | ICD-10-CM | POA: Diagnosis not present

## 2018-03-01 DIAGNOSIS — E1122 Type 2 diabetes mellitus with diabetic chronic kidney disease: Secondary | ICD-10-CM | POA: Diagnosis not present

## 2018-03-01 DIAGNOSIS — M109 Gout, unspecified: Secondary | ICD-10-CM | POA: Diagnosis not present

## 2018-03-01 DIAGNOSIS — I129 Hypertensive chronic kidney disease with stage 1 through stage 4 chronic kidney disease, or unspecified chronic kidney disease: Secondary | ICD-10-CM | POA: Diagnosis not present

## 2018-03-03 DIAGNOSIS — I129 Hypertensive chronic kidney disease with stage 1 through stage 4 chronic kidney disease, or unspecified chronic kidney disease: Secondary | ICD-10-CM | POA: Diagnosis not present

## 2018-03-03 DIAGNOSIS — E1142 Type 2 diabetes mellitus with diabetic polyneuropathy: Secondary | ICD-10-CM | POA: Diagnosis not present

## 2018-03-03 DIAGNOSIS — E1122 Type 2 diabetes mellitus with diabetic chronic kidney disease: Secondary | ICD-10-CM | POA: Diagnosis not present

## 2018-03-03 DIAGNOSIS — N183 Chronic kidney disease, stage 3 (moderate): Secondary | ICD-10-CM | POA: Diagnosis not present

## 2018-03-03 DIAGNOSIS — M109 Gout, unspecified: Secondary | ICD-10-CM | POA: Diagnosis not present

## 2018-03-03 DIAGNOSIS — I2510 Atherosclerotic heart disease of native coronary artery without angina pectoris: Secondary | ICD-10-CM | POA: Diagnosis not present

## 2018-03-05 DIAGNOSIS — E782 Mixed hyperlipidemia: Secondary | ICD-10-CM | POA: Diagnosis not present

## 2018-03-05 DIAGNOSIS — R269 Unspecified abnormalities of gait and mobility: Secondary | ICD-10-CM | POA: Diagnosis not present

## 2018-03-05 DIAGNOSIS — E559 Vitamin D deficiency, unspecified: Secondary | ICD-10-CM | POA: Diagnosis not present

## 2018-03-05 DIAGNOSIS — N390 Urinary tract infection, site not specified: Secondary | ICD-10-CM | POA: Diagnosis not present

## 2018-03-05 DIAGNOSIS — M1000 Idiopathic gout, unspecified site: Secondary | ICD-10-CM | POA: Diagnosis not present

## 2018-03-05 DIAGNOSIS — I10 Essential (primary) hypertension: Secondary | ICD-10-CM | POA: Diagnosis not present

## 2018-03-05 DIAGNOSIS — K219 Gastro-esophageal reflux disease without esophagitis: Secondary | ICD-10-CM | POA: Diagnosis not present

## 2018-03-05 DIAGNOSIS — N39498 Other specified urinary incontinence: Secondary | ICD-10-CM | POA: Diagnosis not present

## 2018-03-05 DIAGNOSIS — F339 Major depressive disorder, recurrent, unspecified: Secondary | ICD-10-CM | POA: Diagnosis not present

## 2018-03-08 DIAGNOSIS — I2510 Atherosclerotic heart disease of native coronary artery without angina pectoris: Secondary | ICD-10-CM | POA: Diagnosis not present

## 2018-03-08 DIAGNOSIS — M109 Gout, unspecified: Secondary | ICD-10-CM | POA: Diagnosis not present

## 2018-03-08 DIAGNOSIS — I129 Hypertensive chronic kidney disease with stage 1 through stage 4 chronic kidney disease, or unspecified chronic kidney disease: Secondary | ICD-10-CM | POA: Diagnosis not present

## 2018-03-08 DIAGNOSIS — E1142 Type 2 diabetes mellitus with diabetic polyneuropathy: Secondary | ICD-10-CM | POA: Diagnosis not present

## 2018-03-08 DIAGNOSIS — N183 Chronic kidney disease, stage 3 (moderate): Secondary | ICD-10-CM | POA: Diagnosis not present

## 2018-03-08 DIAGNOSIS — E1122 Type 2 diabetes mellitus with diabetic chronic kidney disease: Secondary | ICD-10-CM | POA: Diagnosis not present

## 2018-03-10 DIAGNOSIS — E559 Vitamin D deficiency, unspecified: Secondary | ICD-10-CM | POA: Diagnosis not present

## 2018-03-10 DIAGNOSIS — E782 Mixed hyperlipidemia: Secondary | ICD-10-CM | POA: Diagnosis not present

## 2018-03-12 DIAGNOSIS — M109 Gout, unspecified: Secondary | ICD-10-CM | POA: Diagnosis not present

## 2018-03-12 DIAGNOSIS — N183 Chronic kidney disease, stage 3 (moderate): Secondary | ICD-10-CM | POA: Diagnosis not present

## 2018-03-12 DIAGNOSIS — G301 Alzheimer's disease with late onset: Secondary | ICD-10-CM | POA: Diagnosis not present

## 2018-03-12 DIAGNOSIS — F331 Major depressive disorder, recurrent, moderate: Secondary | ICD-10-CM | POA: Diagnosis not present

## 2018-03-12 DIAGNOSIS — F0280 Dementia in other diseases classified elsewhere without behavioral disturbance: Secondary | ICD-10-CM | POA: Diagnosis not present

## 2018-03-12 DIAGNOSIS — E1142 Type 2 diabetes mellitus with diabetic polyneuropathy: Secondary | ICD-10-CM | POA: Diagnosis not present

## 2018-03-12 DIAGNOSIS — E1122 Type 2 diabetes mellitus with diabetic chronic kidney disease: Secondary | ICD-10-CM | POA: Diagnosis not present

## 2018-03-12 DIAGNOSIS — I2510 Atherosclerotic heart disease of native coronary artery without angina pectoris: Secondary | ICD-10-CM | POA: Diagnosis not present

## 2018-03-12 DIAGNOSIS — I129 Hypertensive chronic kidney disease with stage 1 through stage 4 chronic kidney disease, or unspecified chronic kidney disease: Secondary | ICD-10-CM | POA: Diagnosis not present

## 2018-03-19 DIAGNOSIS — K219 Gastro-esophageal reflux disease without esophagitis: Secondary | ICD-10-CM | POA: Diagnosis not present

## 2018-03-19 DIAGNOSIS — N39498 Other specified urinary incontinence: Secondary | ICD-10-CM | POA: Diagnosis not present

## 2018-03-19 DIAGNOSIS — M1000 Idiopathic gout, unspecified site: Secondary | ICD-10-CM | POA: Diagnosis not present

## 2018-03-19 DIAGNOSIS — E559 Vitamin D deficiency, unspecified: Secondary | ICD-10-CM | POA: Diagnosis not present

## 2018-03-19 DIAGNOSIS — F339 Major depressive disorder, recurrent, unspecified: Secondary | ICD-10-CM | POA: Diagnosis not present

## 2018-03-19 DIAGNOSIS — E782 Mixed hyperlipidemia: Secondary | ICD-10-CM | POA: Diagnosis not present

## 2018-03-19 DIAGNOSIS — R269 Unspecified abnormalities of gait and mobility: Secondary | ICD-10-CM | POA: Diagnosis not present

## 2018-03-19 DIAGNOSIS — I10 Essential (primary) hypertension: Secondary | ICD-10-CM | POA: Diagnosis not present

## 2018-03-25 DIAGNOSIS — Q845 Enlarged and hypertrophic nails: Secondary | ICD-10-CM | POA: Diagnosis not present

## 2018-03-25 DIAGNOSIS — L603 Nail dystrophy: Secondary | ICD-10-CM | POA: Diagnosis not present

## 2018-03-25 DIAGNOSIS — B351 Tinea unguium: Secondary | ICD-10-CM | POA: Diagnosis not present

## 2018-03-25 DIAGNOSIS — I739 Peripheral vascular disease, unspecified: Secondary | ICD-10-CM | POA: Diagnosis not present

## 2018-04-02 DIAGNOSIS — I10 Essential (primary) hypertension: Secondary | ICD-10-CM | POA: Diagnosis not present

## 2018-04-02 DIAGNOSIS — E782 Mixed hyperlipidemia: Secondary | ICD-10-CM | POA: Diagnosis not present

## 2018-04-02 DIAGNOSIS — F339 Major depressive disorder, recurrent, unspecified: Secondary | ICD-10-CM | POA: Diagnosis not present

## 2018-04-02 DIAGNOSIS — K219 Gastro-esophageal reflux disease without esophagitis: Secondary | ICD-10-CM | POA: Diagnosis not present

## 2018-04-02 DIAGNOSIS — N390 Urinary tract infection, site not specified: Secondary | ICD-10-CM | POA: Diagnosis not present

## 2018-04-02 DIAGNOSIS — N39498 Other specified urinary incontinence: Secondary | ICD-10-CM | POA: Diagnosis not present

## 2018-04-02 DIAGNOSIS — E559 Vitamin D deficiency, unspecified: Secondary | ICD-10-CM | POA: Diagnosis not present

## 2018-04-02 DIAGNOSIS — R269 Unspecified abnormalities of gait and mobility: Secondary | ICD-10-CM | POA: Diagnosis not present

## 2018-04-02 DIAGNOSIS — M1000 Idiopathic gout, unspecified site: Secondary | ICD-10-CM | POA: Diagnosis not present

## 2018-04-08 DIAGNOSIS — F331 Major depressive disorder, recurrent, moderate: Secondary | ICD-10-CM | POA: Diagnosis not present

## 2018-04-08 DIAGNOSIS — G301 Alzheimer's disease with late onset: Secondary | ICD-10-CM | POA: Diagnosis not present

## 2018-04-08 DIAGNOSIS — F0280 Dementia in other diseases classified elsewhere without behavioral disturbance: Secondary | ICD-10-CM | POA: Diagnosis not present

## 2018-04-30 DIAGNOSIS — Z043 Encounter for examination and observation following other accident: Secondary | ICD-10-CM | POA: Diagnosis not present

## 2018-05-07 DIAGNOSIS — N390 Urinary tract infection, site not specified: Secondary | ICD-10-CM | POA: Diagnosis not present

## 2018-05-07 DIAGNOSIS — R269 Unspecified abnormalities of gait and mobility: Secondary | ICD-10-CM | POA: Diagnosis not present

## 2018-05-07 DIAGNOSIS — I10 Essential (primary) hypertension: Secondary | ICD-10-CM | POA: Diagnosis not present

## 2018-05-07 DIAGNOSIS — E782 Mixed hyperlipidemia: Secondary | ICD-10-CM | POA: Diagnosis not present

## 2018-05-07 DIAGNOSIS — K219 Gastro-esophageal reflux disease without esophagitis: Secondary | ICD-10-CM | POA: Diagnosis not present

## 2018-05-07 DIAGNOSIS — N39498 Other specified urinary incontinence: Secondary | ICD-10-CM | POA: Diagnosis not present

## 2018-05-07 DIAGNOSIS — Z23 Encounter for immunization: Secondary | ICD-10-CM | POA: Diagnosis not present

## 2018-05-07 DIAGNOSIS — E559 Vitamin D deficiency, unspecified: Secondary | ICD-10-CM | POA: Diagnosis not present

## 2018-05-07 DIAGNOSIS — M1000 Idiopathic gout, unspecified site: Secondary | ICD-10-CM | POA: Diagnosis not present

## 2018-05-07 DIAGNOSIS — F339 Major depressive disorder, recurrent, unspecified: Secondary | ICD-10-CM | POA: Diagnosis not present

## 2018-05-07 DIAGNOSIS — R296 Repeated falls: Secondary | ICD-10-CM | POA: Diagnosis not present

## 2018-05-21 DIAGNOSIS — F0280 Dementia in other diseases classified elsewhere without behavioral disturbance: Secondary | ICD-10-CM | POA: Diagnosis not present

## 2018-05-21 DIAGNOSIS — F331 Major depressive disorder, recurrent, moderate: Secondary | ICD-10-CM | POA: Diagnosis not present

## 2018-05-21 DIAGNOSIS — G301 Alzheimer's disease with late onset: Secondary | ICD-10-CM | POA: Diagnosis not present

## 2018-05-26 DIAGNOSIS — B351 Tinea unguium: Secondary | ICD-10-CM | POA: Diagnosis not present

## 2018-05-26 DIAGNOSIS — I739 Peripheral vascular disease, unspecified: Secondary | ICD-10-CM | POA: Diagnosis not present

## 2018-05-26 DIAGNOSIS — Q845 Enlarged and hypertrophic nails: Secondary | ICD-10-CM | POA: Diagnosis not present

## 2018-06-11 DIAGNOSIS — I10 Essential (primary) hypertension: Secondary | ICD-10-CM | POA: Diagnosis not present

## 2018-06-11 DIAGNOSIS — F339 Major depressive disorder, recurrent, unspecified: Secondary | ICD-10-CM | POA: Diagnosis not present

## 2018-06-11 DIAGNOSIS — E782 Mixed hyperlipidemia: Secondary | ICD-10-CM | POA: Diagnosis not present

## 2018-06-11 DIAGNOSIS — M1000 Idiopathic gout, unspecified site: Secondary | ICD-10-CM | POA: Diagnosis not present

## 2018-06-11 DIAGNOSIS — N39498 Other specified urinary incontinence: Secondary | ICD-10-CM | POA: Diagnosis not present

## 2018-06-11 DIAGNOSIS — N390 Urinary tract infection, site not specified: Secondary | ICD-10-CM | POA: Diagnosis not present

## 2018-06-11 DIAGNOSIS — K219 Gastro-esophageal reflux disease without esophagitis: Secondary | ICD-10-CM | POA: Diagnosis not present

## 2018-06-11 DIAGNOSIS — E559 Vitamin D deficiency, unspecified: Secondary | ICD-10-CM | POA: Diagnosis not present

## 2018-06-11 DIAGNOSIS — R296 Repeated falls: Secondary | ICD-10-CM | POA: Diagnosis not present

## 2018-06-11 DIAGNOSIS — R269 Unspecified abnormalities of gait and mobility: Secondary | ICD-10-CM | POA: Diagnosis not present

## 2018-07-02 DIAGNOSIS — F0280 Dementia in other diseases classified elsewhere without behavioral disturbance: Secondary | ICD-10-CM | POA: Diagnosis not present

## 2018-07-02 DIAGNOSIS — G301 Alzheimer's disease with late onset: Secondary | ICD-10-CM | POA: Diagnosis not present

## 2018-07-02 DIAGNOSIS — F331 Major depressive disorder, recurrent, moderate: Secondary | ICD-10-CM | POA: Diagnosis not present

## 2018-07-07 ENCOUNTER — Emergency Department (HOSPITAL_COMMUNITY): Payer: Medicare Other

## 2018-07-07 ENCOUNTER — Inpatient Hospital Stay (HOSPITAL_COMMUNITY)
Admission: EM | Admit: 2018-07-07 | Discharge: 2018-07-11 | DRG: 308 | Disposition: A | Discharge status: Home Health | Payer: Medicare Other | Attending: Internal Medicine | Admitting: Internal Medicine

## 2018-07-07 ENCOUNTER — Encounter (HOSPITAL_COMMUNITY): Payer: Self-pay

## 2018-07-07 ENCOUNTER — Other Ambulatory Visit: Payer: Self-pay

## 2018-07-07 DIAGNOSIS — F329 Major depressive disorder, single episode, unspecified: Secondary | ICD-10-CM | POA: Diagnosis present

## 2018-07-07 DIAGNOSIS — I129 Hypertensive chronic kidney disease with stage 1 through stage 4 chronic kidney disease, or unspecified chronic kidney disease: Secondary | ICD-10-CM | POA: Diagnosis present

## 2018-07-07 DIAGNOSIS — F0390 Unspecified dementia without behavioral disturbance: Secondary | ICD-10-CM | POA: Diagnosis present

## 2018-07-07 DIAGNOSIS — R0902 Hypoxemia: Secondary | ICD-10-CM | POA: Diagnosis not present

## 2018-07-07 DIAGNOSIS — F32A Depression, unspecified: Secondary | ICD-10-CM | POA: Diagnosis present

## 2018-07-07 DIAGNOSIS — I10 Essential (primary) hypertension: Secondary | ICD-10-CM | POA: Diagnosis not present

## 2018-07-07 DIAGNOSIS — I495 Sick sinus syndrome: Principal | ICD-10-CM | POA: Diagnosis present

## 2018-07-07 DIAGNOSIS — R42 Dizziness and giddiness: Secondary | ICD-10-CM | POA: Diagnosis not present

## 2018-07-07 DIAGNOSIS — B961 Klebsiella pneumoniae [K. pneumoniae] as the cause of diseases classified elsewhere: Secondary | ICD-10-CM | POA: Diagnosis present

## 2018-07-07 DIAGNOSIS — Z66 Do not resuscitate: Secondary | ICD-10-CM | POA: Diagnosis present

## 2018-07-07 DIAGNOSIS — G9341 Metabolic encephalopathy: Secondary | ICD-10-CM | POA: Diagnosis not present

## 2018-07-07 DIAGNOSIS — R410 Disorientation, unspecified: Secondary | ICD-10-CM | POA: Diagnosis not present

## 2018-07-07 DIAGNOSIS — Z79899 Other long term (current) drug therapy: Secondary | ICD-10-CM

## 2018-07-07 DIAGNOSIS — Z8249 Family history of ischemic heart disease and other diseases of the circulatory system: Secondary | ICD-10-CM

## 2018-07-07 DIAGNOSIS — R000 Tachycardia, unspecified: Secondary | ICD-10-CM | POA: Diagnosis not present

## 2018-07-07 DIAGNOSIS — Z8349 Family history of other endocrine, nutritional and metabolic diseases: Secondary | ICD-10-CM

## 2018-07-07 DIAGNOSIS — I2510 Atherosclerotic heart disease of native coronary artery without angina pectoris: Secondary | ICD-10-CM | POA: Diagnosis present

## 2018-07-07 DIAGNOSIS — R001 Bradycardia, unspecified: Secondary | ICD-10-CM | POA: Diagnosis not present

## 2018-07-07 DIAGNOSIS — R32 Unspecified urinary incontinence: Secondary | ICD-10-CM | POA: Diagnosis present

## 2018-07-07 DIAGNOSIS — N390 Urinary tract infection, site not specified: Secondary | ICD-10-CM | POA: Diagnosis present

## 2018-07-07 DIAGNOSIS — N179 Acute kidney failure, unspecified: Secondary | ICD-10-CM | POA: Diagnosis not present

## 2018-07-07 DIAGNOSIS — E559 Vitamin D deficiency, unspecified: Secondary | ICD-10-CM | POA: Diagnosis present

## 2018-07-07 DIAGNOSIS — E1122 Type 2 diabetes mellitus with diabetic chronic kidney disease: Secondary | ICD-10-CM | POA: Diagnosis present

## 2018-07-07 DIAGNOSIS — E119 Type 2 diabetes mellitus without complications: Secondary | ICD-10-CM | POA: Diagnosis not present

## 2018-07-07 DIAGNOSIS — Z87440 Personal history of urinary (tract) infections: Secondary | ICD-10-CM

## 2018-07-07 DIAGNOSIS — I959 Hypotension, unspecified: Secondary | ICD-10-CM | POA: Diagnosis not present

## 2018-07-07 DIAGNOSIS — E785 Hyperlipidemia, unspecified: Secondary | ICD-10-CM | POA: Diagnosis present

## 2018-07-07 DIAGNOSIS — R9431 Abnormal electrocardiogram [ECG] [EKG]: Secondary | ICD-10-CM | POA: Diagnosis not present

## 2018-07-07 DIAGNOSIS — N182 Chronic kidney disease, stage 2 (mild): Secondary | ICD-10-CM | POA: Diagnosis present

## 2018-07-07 DIAGNOSIS — Z801 Family history of malignant neoplasm of trachea, bronchus and lung: Secondary | ICD-10-CM

## 2018-07-07 DIAGNOSIS — Z955 Presence of coronary angioplasty implant and graft: Secondary | ICD-10-CM

## 2018-07-07 DIAGNOSIS — N289 Disorder of kidney and ureter, unspecified: Secondary | ICD-10-CM | POA: Diagnosis not present

## 2018-07-07 DIAGNOSIS — Z87891 Personal history of nicotine dependence: Secondary | ICD-10-CM

## 2018-07-07 DIAGNOSIS — Z888 Allergy status to other drugs, medicaments and biological substances status: Secondary | ICD-10-CM

## 2018-07-07 HISTORY — DX: Anxiety disorder, unspecified: F41.9

## 2018-07-07 HISTORY — DX: Disorder of kidney and ureter, unspecified: N28.9

## 2018-07-07 HISTORY — DX: Major depressive disorder, single episode, unspecified: F32.9

## 2018-07-07 HISTORY — DX: Depression, unspecified: F32.A

## 2018-07-07 HISTORY — DX: Vitamin D deficiency, unspecified: E55.9

## 2018-07-07 HISTORY — DX: Hyperlipidemia, unspecified: E78.5

## 2018-07-07 LAB — CBC
HCT: 37.2 % (ref 36.0–46.0)
Hemoglobin: 11.9 g/dL — ABNORMAL LOW (ref 12.0–15.0)
MCH: 31.3 pg (ref 26.0–34.0)
MCHC: 32 g/dL (ref 30.0–36.0)
MCV: 97.9 fL (ref 80.0–100.0)
NRBC: 0 % (ref 0.0–0.2)
Platelets: 157 10*3/uL (ref 150–400)
RBC: 3.8 MIL/uL — ABNORMAL LOW (ref 3.87–5.11)
RDW: 14.6 % (ref 11.5–15.5)
WBC: 11.9 10*3/uL — AB (ref 4.0–10.5)

## 2018-07-07 LAB — I-STAT CHEM 8, ED
BUN: 32 mg/dL — ABNORMAL HIGH (ref 8–23)
Calcium, Ion: 1 mmol/L — ABNORMAL LOW (ref 1.15–1.40)
Chloride: 107 mmol/L (ref 98–111)
Creatinine, Ser: 2.1 mg/dL — ABNORMAL HIGH (ref 0.44–1.00)
GLUCOSE: 189 mg/dL — AB (ref 70–99)
HCT: 38 % (ref 36.0–46.0)
HEMOGLOBIN: 12.9 g/dL (ref 12.0–15.0)
POTASSIUM: 4.3 mmol/L (ref 3.5–5.1)
SODIUM: 139 mmol/L (ref 135–145)
TCO2: 21 mmol/L — ABNORMAL LOW (ref 22–32)

## 2018-07-07 LAB — COMPREHENSIVE METABOLIC PANEL
ALBUMIN: 3.5 g/dL (ref 3.5–5.0)
ALT: 18 U/L (ref 0–44)
AST: 18 U/L (ref 15–41)
Alkaline Phosphatase: 57 U/L (ref 38–126)
Anion gap: 10 (ref 5–15)
BUN: 31 mg/dL — ABNORMAL HIGH (ref 8–23)
CHLORIDE: 106 mmol/L (ref 98–111)
CO2: 23 mmol/L (ref 22–32)
Calcium: 8.5 mg/dL — ABNORMAL LOW (ref 8.9–10.3)
Creatinine, Ser: 2.02 mg/dL — ABNORMAL HIGH (ref 0.44–1.00)
GFR calc Af Amer: 25 mL/min — ABNORMAL LOW (ref >60)
GFR calc non Af Amer: 21 mL/min — ABNORMAL LOW (ref >60)
GLUCOSE: 200 mg/dL — AB (ref 70–99)
POTASSIUM: 4.4 mmol/L (ref 3.5–5.1)
SODIUM: 139 mmol/L (ref 135–145)
Total Bilirubin: 0.5 mg/dL (ref 0.3–1.2)
Total Protein: 5.9 g/dL — ABNORMAL LOW (ref 6.5–8.1)

## 2018-07-07 LAB — I-STAT TROPONIN, ED: Troponin i, poc: 0.01 ng/mL (ref 0.00–0.08)

## 2018-07-07 LAB — GLUCOSE, CAPILLARY: Glucose-Capillary: 144 mg/dL — ABNORMAL HIGH (ref 70–99)

## 2018-07-07 MED ORDER — ACETAMINOPHEN 325 MG PO TABS: 650.0000 mg | ORAL_TABLET | Freq: Four times a day (QID) | ORAL | Status: DC | PRN

## 2018-07-07 MED ORDER — ATROPINE SULFATE 1 MG/10ML IJ SOSY
0.5000 mg | PREFILLED_SYRINGE | Freq: Once | INTRAMUSCULAR | Status: AC
  Administered 2018-07-07: 0.5 mg via INTRAVENOUS
  Filled 2018-07-07: qty 10

## 2018-07-07 MED ORDER — ONDANSETRON HCL 4 MG PO TABS: 4.0000 mg | ORAL_TABLET | Freq: Four times a day (QID) | ORAL | Status: DC | PRN

## 2018-07-07 MED ORDER — HEPARIN SODIUM (PORCINE) 5000 UNIT/ML IJ SOLN: 5000.0000 [IU] | Freq: Three times a day (TID) | INTRAMUSCULAR | Status: DC

## 2018-07-07 MED ORDER — ONDANSETRON HCL 4 MG/2ML IJ SOLN: 4.0000 mg | Freq: Four times a day (QID) | INTRAMUSCULAR | Status: DC | PRN

## 2018-07-07 MED ORDER — INSULIN ASPART 100 UNIT/ML ~~LOC~~ SOLN
0.0000 [IU] | Freq: Three times a day (TID) | SUBCUTANEOUS | Status: DC
  Administered 2018-07-09: 3 [IU] via SUBCUTANEOUS
  Administered 2018-07-10: 2 [IU] via SUBCUTANEOUS

## 2018-07-07 MED ORDER — ATROPINE SULFATE 1 MG/10ML IJ SOSY: 0.5000 mg | PREFILLED_SYRINGE | INTRAMUSCULAR | Status: DC | PRN

## 2018-07-07 MED ORDER — INSULIN ASPART 100 UNIT/ML ~~LOC~~ SOLN: 0.0000 [IU] | Freq: Every day | SUBCUTANEOUS | Status: DC

## 2018-07-07 MED ORDER — ALLOPURINOL 300 MG PO TABS
300.0000 mg | ORAL_TABLET | Freq: Every day | ORAL | Status: DC
  Administered 2018-07-08 – 2018-07-11 (×4): 300 mg via ORAL
  Filled 2018-07-07 (×4): qty 1

## 2018-07-07 MED ORDER — ATORVASTATIN CALCIUM 10 MG PO TABS
10.0000 mg | ORAL_TABLET | Freq: Every day | ORAL | Status: DC
  Administered 2018-07-08 – 2018-07-10 (×3): 10 mg via ORAL
  Filled 2018-07-07 (×3): qty 1

## 2018-07-07 MED ORDER — ACETAMINOPHEN 650 MG RE SUPP: 650.0000 mg | Freq: Four times a day (QID) | RECTAL | Status: DC | PRN

## 2018-07-07 MED ORDER — PANTOPRAZOLE SODIUM 40 MG PO TBEC
40.0000 mg | DELAYED_RELEASE_TABLET | Freq: Every day | ORAL | Status: DC
  Administered 2018-07-08 – 2018-07-11 (×4): 40 mg via ORAL
  Filled 2018-07-07 (×4): qty 1

## 2018-07-07 MED ORDER — SERTRALINE HCL 100 MG PO TABS
100.0000 mg | ORAL_TABLET | Freq: Every day | ORAL | Status: DC
  Administered 2018-07-08 – 2018-07-09 (×2): 100 mg via ORAL
  Filled 2018-07-07 (×2): qty 1

## 2018-07-07 MED ORDER — SODIUM CHLORIDE 0.9% FLUSH
3.0000 mL | Freq: Two times a day (BID) | INTRAVENOUS | Status: DC
  Administered 2018-07-07 – 2018-07-11 (×6): 3 mL via INTRAVENOUS

## 2018-07-07 MED ORDER — SODIUM CHLORIDE 0.9 % IV SOLN
INTRAVENOUS | Status: AC
  Administered 2018-07-07: 23:00:00 via INTRAVENOUS

## 2018-07-07 NOTE — Consult Note (Signed)
CARDIOLOGY CONSULT NOTE   Patient ID: Emily Waller MRN: [id number], DOB/AGE: [birth year]   Admit date: 07/07/2018 Date of Consult: 07/07/2018   Primary Physician: Orpah Melter, MD Primary Cardiologist: none  Consulting Physician: Dr. Winfred Leeds Reason for Consult: bradycardia, hypotension  Pt. Profile 84 yo female w/ history of HTN, HLD, DMII, CKD and mild dementia who presents with bradycardia and hypotension  Problem List  Past Medical History:  Diagnosis Date  . Anxiety   . Depression   . Diabetes mellitus without complication ([place])   . Hyperlipidemia   . Hypertension   . Renal disorder    Kidney disease  . Vitamin D deficiency     History reviewed. No pertinent surgical history.   Allergies  Allergies  Allergen Reactions  . Colcrys [Colchicine] Nausea Only    Stomach upset   . Lovastatin Other (See Comments)    myalgia    HPI  [name] 84 yo woman w/ no known heart history. She reports no new symptoms, however is not able to reliably share her story. Her daughter was present and provided the information.   She reports her mother in her usual state of health, able to walk around with a walker and participate in activities at the assisted living facility. However today during dinner she was noted to be fatigued and very weak. Her vitals were taken and her HR was noted to be in to 30-40s with SBP in thr 70s.   She was brought to the ED and given some IVF. Her SBP now mid-90s with junctional rhythm in low-40s. Asymptomatic and at her baseline per daughter.   Patient denies any dysuria, however has a history of UTIs and is incontinent. No sick contacts. No fevers noted by facility.   Inpatient Medications  . [START ON 07/08/2018] allopurinol  300 mg Oral Daily  . [START ON 07/08/2018] atorvastatin  10 mg Oral q1800  . heparin  5,000 Units Subcutaneous Q8H  . insulin aspart  0-5 Units Subcutaneous QHS  . [START ON 07/08/2018] insulin aspart  0-9 Units  Subcutaneous TID WC  . [START ON 07/08/2018] pantoprazole  40 mg Oral Daily  . [START ON 07/08/2018] sertraline  100 mg Oral Daily  . sodium chloride flush  3 mL Intravenous Q12H    Family History Family History  Problem Relation Age of Onset  . Hyperlipidemia Mother   . Hypertension Mother   . Cancer Father   . Heart disease Father   . CAD Maternal Grandmother   . CAD Maternal Grandfather   . Lung cancer Sister      Social History Social History   Socioeconomic History  . Marital status: Unknown    Spouse name: Not on file  . Number of children: Not on file  . Years of education: Not on file  . Highest education level: Not on file  Occupational History  . Not on file  Social Needs  . Financial resource strain: Not on file  . Food insecurity:    Worry: Not on file    Inability: Not on file  . Transportation needs:    Medical: Not on file    Non-medical: Not on file  Tobacco Use  . Smoking status: Former Smoker    Last attempt to quit: 10/02/1969    Years since quitting: 48.7  . Smokeless tobacco: Never Used  Substance and Sexual Activity  . Alcohol use: No    Alcohol/week: 0.0 standard drinks    Frequency: Never  .  Drug use: No  . Sexual activity: Not Currently  Lifestyle  . Physical activity:    Days per week: Not on file    Minutes per session: Not on file  . Stress: Not on file  Relationships  . Social connections:    Talks on phone: Not on file    Gets together: Not on file    Attends religious service: Not on file    Active member of club or organization: Not on file    Attends meetings of clubs or organizations: Not on file    Relationship status: Not on file  . Intimate partner violence:    Fear of current or ex partner: Not on file    Emotionally abused: Not on file    Physically abused: Not on file    Forced sexual activity: Not on file  Other Topics Concern  . Not on file  Social History Narrative  . Not on file     Review of  Systems  General:  No chills, fever, night sweats or weight changes.  Cardiovascular:  No chest pain, dyspnea on exertion, edema, orthopnea, palpitations, paroxysmal nocturnal dyspnea. Dermatological: No rash, lesions/masses Respiratory: No cough, dyspnea Urologic: No hematuria, dysuria Abdominal:   No nausea, vomiting, diarrhea, bright red blood per rectum, melena, or hematemesis Neurologic:  No visual changes, wkns, changes in mental status. All other systems reviewed and are otherwise negative except as noted above.  Physical Exam  Blood pressure 108/64, pulse (!) 40, temperature 97.9 F (36.6 C), temperature source Oral, resp. rate 11, SpO2 97 %.  General: Pleasant, NAD Psych: Normal affect. Neuro:  Moves all extremities spontaneously. HEENT: Normal  Neck: Supple without bruits or JVD, no cannon A waves appreciated Lungs:  Resp regular and unlabored, CTA. Heart: bradycardic, regular, no murmur Abdomen: Soft, non-tender, non-distended, BS + x 4.  Extremities: No clubbing, cyanosis or edema. DP/PT/Radials 2+ and equal bilaterally.  Labs  No results for input(s): CKTOTAL, CKMB, TROPONINI in the last 72 hours. Lab Results  Component Value Date   WBC 11.9 (H) 07/07/2018   HGB 12.9 07/07/2018   HCT 38.0 07/07/2018   MCV 97.9 07/07/2018   PLT 157 07/07/2018    Recent Labs  Lab 07/07/18 1815 07/07/18 1820  NA 139 139  K 4.4 4.3  CL 106 107  CO2 23  --   BUN 31* 32*  CREATININE 2.02* 2.10*  CALCIUM 8.5*  --   PROT 5.9*  --   BILITOT 0.5  --   ALKPHOS 57  --   ALT 18  --   AST 18  --   GLUCOSE 200* 189*   No results found for: CHOL, HDL, LDLCALC, TRIG No results found for: DDIMER  Radiology/Studies  Dg Chest Portable 1 View  Result Date: 07/07/2018 CLINICAL DATA:  Hypotensive, bradycardia. History of hypertension hyperlipidemia. EXAM: PORTABLE CHEST 1 VIEW COMPARISON:  None available for comparison at time of study interpretation. FINDINGS: Cardiomediastinal  silhouette is normal. Calcified aortic arch. No pleural effusions or focal consolidations. Trachea projects midline and there is no pneumothorax. Soft tissue planes and included osseous structures are non-suspicious. Chronic deformity LEFT humeral head. IMPRESSION: 1. No acute cardiopulmonary process. 2.  Aortic Atherosclerosis (ICD10-I70.0). Electronically Signed   By: Elon Alas M.D.   On: 07/07/2018 19:22    ECG Junctional escape vs. Sinus bradycardia, QRS ~100, prior EKG in 2018 NSR with normal PR  ASSESSMENT AND PLAN 84yo fairly active female who presents with symptomatic bradycardia  Would ensure no infection present. CXR clear, UA pending. No signs of cellulitis or any other source of infection. Patient walks with walker. Never broke clavicle. She has intermittent sinus bradycardia and junctional escape rhythm which is fairly narrow and robust given the narrow QRS.No symptoms or signs of heart failure. No h/o or current chest pain.   Unless there is an active infection which does not seem the case, she will require a pacemaker. Possible DC PM vs. Leadless PM. Please make NPO at midnight just in case there is room on the schedule.   Would get TTE to evaluate EF and TSH.  Hold all nodal agents including CCB.  Have atropine at bedside.  Already with pads on.  If becomes symptomatic and/or hypotensive, can start Dopamine infusion at 11mcg/kg/min. If no improvement with Dopamine, she would then need a transvenous pacing wire. Please call with any questions or if any issues. Patient is DNR, which daughter understands would not preclude her from getting a PM.   Signed, Charlies Silvers, MD Overnight Fellow

## 2018-07-07 NOTE — ED Notes (Signed)
Pt has not complaints at this time. Pt does have dementia and family reports she is at her baseline.

## 2018-07-07 NOTE — ED Triage Notes (Signed)
Per [facility]: Pt from [facility] on [address]. Pt is coming in for low BP and HR. Pts vitals were taken this evening and staff found it. Pt also complaining of some dizziness. Pt is alert and oriented with EMS. Pt has had 500 ml of saline with EMS. Pts BP with EMS was 88/46, hr was 38.

## 2018-07-07 NOTE — ED Provider Notes (Addendum)
Level 5 caveat stable vital signs.  Patient brought by EMS from nursing home after she was noted to be bradycardic with a heart rate of 38 and blood pressure in the 70s today.  Patient denies shortness of breath denies feeling weak.  States I feel fine" on exam she is frail-appearing no distress.  Lungs clear to auscultation heart bradycardic regular rhythm.   Orlie Dakin, MD 07/07/18 1825 Daughter and healthcare power of attorney arrived.  She confirmed the patient is DO NOT RESUSCITATE CODE STATUS.  I stated to patient's daughter that she may ultimately require pacemaker, daughter is calling her sibling who is out of state to discuss whether patient and/or family would want that intervention    Orlie Dakin, MD 07/07/18 [phone]

## 2018-07-07 NOTE — H&P (Signed)
History and Physical    [name]:[id number] DOB: [birth year] DOA: 07/07/2018  PCP: Orpah Melter, MD   Patient coming from: Assisted living facility   Chief Complaint: Low heart rate and hypotension   HPI: Emily Waller is a 84 y.o. female with medical history significant for dementia, hypertension, diet-controlled diabetes mellitus, and renal insufficiency, now presenting to the emergency department from her assisted living facility for evaluation of low heart rate and low blood pressure.  Patient had reportedly been in her usual state of health, was noted by ALF personnel to be generally weak and with increased confusion; this prompted a check of her vital signs which revealed heart rate in the upper 95A and systolic blood pressure in the upper 80s.  EMS was called, patient was given 500 cc normal saline, and brought into the ED for further evaluation.  She had complained of some dizziness, but denies any chest pain, fevers, chills, abdominal pain, cough, or headache.  Family is at the bedside and assists with the history.  She had not been complaining of anything leading up to this and does not have any history of similar problems. Cognition is at baseline per family.   ED Course: Upon arrival to the ED, patient is found to be bradycardic in the 21H, systolic blood pressure in the mid to upper 90s, and normal O2 saturations.  EKG features a junctional rhythm with rate 42 and nonspecific IVCD.  Chest x-ray is negative for acute cardiopulmonary disease.  Chemistry panel is notable for BUN of 31, glucose 200, and creatinine 2.02, up from 1.2 remotely.  CBC features a leukocytosis to 11,900 and troponin is normal.  Patient was treated with 0.5 mg of atropine in the ED. Cardiology was consulted by the ED physician and recommended a medical admission.  Review of Systems:  All other systems reviewed and apart from HPI, are negative.  Past Medical History:  Diagnosis Date  . Anxiety   .  Depression   . Diabetes mellitus without complication ([place])   . Hyperlipidemia   . Hypertension   . Renal disorder    Kidney disease  . Vitamin D deficiency     History reviewed. No pertinent surgical history.   reports that she quit smoking about 48 years ago. She has never used smokeless tobacco. She reports that she does not drink alcohol or use drugs.  Allergies  Allergen Reactions  . Colcrys [Colchicine] Nausea Only    Stomach upset   . Lovastatin Other (See Comments)    myalgia    Family History  Problem Relation Age of Onset  . Hyperlipidemia Mother   . Hypertension Mother   . Cancer Father   . Heart disease Father   . CAD Maternal Grandmother   . CAD Maternal Grandfather   . Lung cancer Sister      Prior to Admission medications   Medication Sig Start Date End Date Taking? Authorizing Provider  allopurinol (ZYLOPRIM) 300 MG tablet Take 300 mg by mouth daily.   Yes [provider]  amLODipine (NORVASC) 5 MG tablet Take 5 mg by mouth daily.   Yes [provider]  atorvastatin (LIPITOR) 10 MG tablet Take 10 mg by mouth daily.   Yes [provider]  ergocalciferol (VITAMIN D2) 1.25 MG (50000 UT) capsule Take 50,000 Units by mouth every Tuesday.   Yes [provider]  ezetimibe (ZETIA) 10 MG tablet Take 10 mg by mouth daily.   Yes [provider]  KRILL OIL PO Take 1 capsule by mouth daily.   Yes [provider]  losartan-hydrochlorothiazide (HYZAAR) 50-12.5 MG tablet Take 1 tablet by mouth daily.   Yes [provider]  oxybutynin (DITROPAN-XL) 10 MG 24 hr tablet Take 10 mg by mouth daily.   Yes [provider]  pantoprazole (PROTONIX) 40 MG tablet Take 40 mg by mouth daily.   Yes [provider]  sertraline (ZOLOFT) 100 MG tablet Take 100 mg by mouth daily.   Yes [provider]  Turmeric 500 MG CAPS Take 500 mg by mouth daily.   Yes [provider]    Physical  Exam: Vitals:   07/07/18 1810 07/07/18 1830 07/07/18 1845 07/07/18 1924  BP:  (!) 97/46 (!) 102/56 (!) 99/48  Pulse:  (!) 47 (!) 46 (!) 45  Resp:  15 15 17   SpO2: 99% 98% 100% 100%    Constitutional: NAD, calm  Eyes: PERTLA, lids and conjunctivae normal ENMT: Mucous membranes are moist. Posterior pharynx clear of any exudate or lesions.   Neck: normal, supple, no masses, no thyromegaly Respiratory: clear to auscultation bilaterally, no wheezing, no crackles. Normal respiratory effort.    Cardiovascular: Rate ~45 and regular. No extremity edema.   Abdomen: No distension, no tenderness, soft. Bowel sounds normal.  Musculoskeletal: no clubbing / cyanosis. No joint deformity upper and lower extremities.    Skin: no significant rashes, lesions, ulcers. Warm, dry, well-perfused. Neurologic: no facial asymmetry. Sensation intact. Moving all extremities.  Psychiatric: Alert and oriented to person only. Pleasant, cooperative.    Labs on Admission: I have personally reviewed following labs and imaging studies  CBC: Recent Labs  Lab 07/07/18 1815 07/07/18 1820  WBC 11.9*  --   HGB 11.9* 12.9  HCT 37.2 38.0  MCV 97.9  --   PLT 157  --    Basic Metabolic Panel: Recent Labs  Lab 07/07/18 1815 07/07/18 1820  NA 139 139  K 4.4 4.3  CL 106 107  CO2 23  --   GLUCOSE 200* 189*  BUN 31* 32*  CREATININE 2.02* 2.10*  CALCIUM 8.5*  --    GFR: CrCl cannot be calculated (Unknown ideal weight.). Liver Function Tests: Recent Labs  Lab 07/07/18 1815  AST 18  ALT 18  ALKPHOS 57  BILITOT 0.5  PROT 5.9*  ALBUMIN 3.5   No results for input(s): LIPASE, AMYLASE in the last 168 hours. No results for input(s): AMMONIA in the last 168 hours. Coagulation Profile: No results for input(s): INR, PROTIME in the last 168 hours. Cardiac Enzymes: No results for input(s): CKTOTAL, CKMB, CKMBINDEX, TROPONINI in the last 168 hours. BNP (last 3 results) No results for input(s): PROBNP in the last  8760 hours. HbA1C: No results for input(s): HGBA1C in the last 72 hours. CBG: No results for input(s): GLUCAP in the last 168 hours. Lipid Profile: No results for input(s): CHOL, HDL, LDLCALC, TRIG, CHOLHDL, LDLDIRECT in the last 72 hours. Thyroid Function Tests: No results for input(s): TSH, T4TOTAL, FREET4, T3FREE, THYROIDAB in the last 72 hours. Anemia Panel: No results for input(s): VITAMINB12, FOLATE, FERRITIN, TIBC, IRON, RETICCTPCT in the last 72 hours. Urine analysis: No results found for: COLORURINE, APPEARANCEUR, LABSPEC, PHURINE, GLUCOSEU, HGBUR, BILIRUBINUR, KETONESUR, PROTEINUR, UROBILINOGEN, NITRITE, LEUKOCYTESUR Sepsis Labs: @LABRCNTIP (procalcitonin:4,lacticidven:4) )No results found for this or any previous visit (from the past 240 hour(s)).   Radiological Exams on Admission: Dg Chest Portable 1 View  Result Date: 07/07/2018 CLINICAL DATA:  Hypotensive, bradycardia. History of hypertension  hyperlipidemia. EXAM: PORTABLE CHEST 1 VIEW COMPARISON:  None available for comparison at time of study interpretation. FINDINGS: Cardiomediastinal silhouette is normal. Calcified aortic arch. No pleural effusions or focal consolidations. Trachea projects midline and there is no pneumothorax. Soft tissue planes and included osseous structures are non-suspicious. Chronic deformity LEFT humeral head. IMPRESSION: 1. No acute cardiopulmonary process. 2.  Aortic Atherosclerosis (ICD10-I70.0). Electronically Signed   By: Elon Alas M.D.   On: 07/07/2018 19:22    EKG: Independently reviewed. Junctional rhythm, rate 43, non-specific IVCD.   Assessment/Plan  1. Symptomatic bradycardia  - Presents from ALF with bradycardia and hypotension  - She was treated with 500 cc NS by EMS, given 0.5 mg atropine x1  - SBP 90's and HR 40's with junctional rhythm in ED  - She had been generally weak and complained of dizziness prior to arrival in ED, but asymptomatic currently  - Cardiology is  consulting and much appreciated, will follow-up recs  - No evidence for ischemic etiology; does not appear to medication-related; there is a mild leukocytosis without infectious sxs but has hx of UTI's that manifest with gen weakness as she was experiencing earlier (though this seems to have resolved with improved BP)  - Continue cardiac monitoring, pacer at bedside, atropine as-needed, check temperature and UA, check TSH    2. History of HTN  - SBP 80's with EMS, 90's in ED  - Hold antihypertensives for now    3. Renal insufficiency  - SCr is 2.01 on admission, uncertain baseline, was 1.2 in 2015  - Prerenal azotemia possible in setting of low BP  - She was given 500 cc NS with EMS  - Check urine chemistries, hold losartan-HCTZ, gentle IVF hydration, renally-dose medications, and repeat chem panel in am   4. Type II DM  - Diet-controlled at her ALF, serum glucose is 200 in ED  - Check CBG's and use a SSI while in hospital    5. Depression  - Continue Zoloft    DVT prophylaxis: SCD's  Code Status: DNR  Family Communication: Daughter and granddaughter updated at bedside Consults called: Cardiology  Admission status: Observation     Vianne Bulls, MD [facility] Pager [phone]  If 7PM-7AM, please contact night-coverage [email or web address] Password [email or web address]  07/07/2018, 8:11 PM

## 2018-07-07 NOTE — ED Provider Notes (Signed)
[place] EMERGENCY DEPARTMENT Provider Note   CSN: [id number] Arrival date & time: 07/07/18  1803     History   Chief Complaint Chief Complaint  Patient presents with  . Bradycardia  . Hypotension    HPI Emily Waller is a 84 y.o. female.  The history is provided by medical records, the patient, the EMS personnel and a relative. No language interpreter was used.   Emily Waller is a 84 y.o. female  with a PMH of DM, HTN, HLD, CAD s/p PCI who presents to the Emergency Department from [facility] facility for bradycardia and hypotension. Per EMS, staff was doing nightly rounding when they noticed her heart rate and blood pressure were lower than baseline. HR of 38 with BP of 88/46 upon EMS arrival.  She was given 500 cc bolus with little change in blood pressure upon arrival to ED.  No other medications given prior to arrival.  No history of similar per chart review or daughter at bedside.  No new medication changes.  Patient denies any chest pain or shortness of breath.  Her only complaint is feeling tired.  Facility states that she typically walks around, pacing throughout the day, but did not do this today.  No known fever.  No recent illness. Daughter at bedside reports this is her baseline mentation.    Level V caveat applies 2/2 acuity of condition, mental status  Past Medical History:  Diagnosis Date  . Anxiety   . Depression   . Diabetes mellitus without complication ([place])   . Hyperlipidemia   . Hypertension   . Renal disorder    Kidney disease  . Vitamin D deficiency     Patient Active Problem List   Diagnosis Date Noted  . Symptomatic bradycardia 07/07/2018  . Renal insufficiency 07/07/2018  . Diet-controlled diabetes mellitus ([place]) 07/07/2018  . Itching 09/12/2015  . Diabetes ([place]) 09/12/2015  . HTN (hypertension) 09/12/2015  . Depression 09/12/2015    History reviewed. No pertinent surgical history.   OB History   No obstetric  history on file.      Home Medications    Prior to Admission medications   Medication Sig Start Date End Date Taking? Authorizing Provider  allopurinol (ZYLOPRIM) 300 MG tablet Take 300 mg by mouth daily.   Yes [provider]  amLODipine (NORVASC) 5 MG tablet Take 5 mg by mouth daily.   Yes [provider]  atorvastatin (LIPITOR) 10 MG tablet Take 10 mg by mouth daily.   Yes [provider]  ergocalciferol (VITAMIN D2) 1.25 MG (50000 UT) capsule Take 50,000 Units by mouth every Tuesday.   Yes [provider]  ezetimibe (ZETIA) 10 MG tablet Take 10 mg by mouth daily.   Yes [provider]  KRILL OIL PO Take 1 capsule by mouth daily.   Yes [provider]  losartan-hydrochlorothiazide (HYZAAR) 50-12.5 MG tablet Take 1 tablet by mouth daily.   Yes [provider]  oxybutynin (DITROPAN-XL) 10 MG 24 hr tablet Take 10 mg by mouth daily.   Yes [provider]  pantoprazole (PROTONIX) 40 MG tablet Take 40 mg by mouth daily.   Yes [provider]  sertraline (ZOLOFT) 100 MG tablet Take 100 mg by mouth daily.   Yes [provider]  Turmeric 500 MG CAPS Take 500 mg by mouth daily.   Yes [provider]    Family History Family History  Problem Relation Age of Onset  . Hyperlipidemia  Mother   . Hypertension Mother   . Cancer Father   . Heart disease Father   . CAD Maternal Grandmother   . CAD Maternal Grandfather   . Lung cancer Sister     Social History Social History   Tobacco Use  . Smoking status: Former Smoker    Last attempt to quit: 10/02/1969    Years since quitting: 48.7  . Smokeless tobacco: Never Used  Substance Use Topics  . Alcohol use: No    Alcohol/week: 0.0 standard drinks    Frequency: Never  . Drug use: No     Allergies   Colcrys [colchicine] and Lovastatin   Review of Systems Review of Systems  Unable to perform ROS: Dementia     Physical Exam Updated  Vital Signs BP 108/64   Pulse (!) 40   Temp 97.9 F (36.6 C) (Oral)   Resp 11   SpO2 97%   Physical Exam Vitals signs and nursing note reviewed.  Constitutional:      General: She is not in acute distress.    Appearance: She is well-developed.  HENT:     Head: Normocephalic and atraumatic.  Cardiovascular:     Heart sounds: Normal heart sounds. No murmur.     Comments: Bradycardia.  Pulmonary:     Effort: Pulmonary effort is normal. No respiratory distress.     Breath sounds: Normal breath sounds.  Abdominal:     General: There is no distension.     Palpations: Abdomen is soft.     Tenderness: There is no abdominal tenderness.  Musculoskeletal:        General: No swelling.  Skin:    General: Skin is warm and dry.  Neurological:     Mental Status: She is alert.     Comments: Alert to self only. Confused. Baseline per daughter who is at the bedside.       ED Treatments / Results  Labs (all labs ordered are listed, but only abnormal results are displayed) Labs Reviewed  CBC - Abnormal; Notable for the following components:      Result Value   WBC 11.9 (*)    RBC 3.80 (*)    Hemoglobin 11.9 (*)    All other components within normal limits  COMPREHENSIVE METABOLIC PANEL - Abnormal; Notable for the following components:   Glucose, Bld 200 (*)    BUN 31 (*)    Creatinine, Ser 2.02 (*)    Calcium 8.5 (*)    Total Protein 5.9 (*)    GFR calc non Af Amer 21 (*)    GFR calc Af Amer 25 (*)    All other components within normal limits  I-STAT CHEM 8, ED - Abnormal; Notable for the following components:   BUN 32 (*)    Creatinine, Ser 2.10 (*)    Glucose, Bld 189 (*)    Calcium, Ion 1.00 (*)    TCO2 21 (*)    All other components within normal limits  URINE CULTURE  URINALYSIS, ROUTINE W REFLEX MICROSCOPIC  BASIC METABOLIC PANEL  MAGNESIUM  CBC WITH DIFFERENTIAL/PLATELET  I-STAT TROPONIN, ED    EKG EKG Interpretation  Date/Time:  Wednesday July 07 2018  18:12:28 EST Ventricular Rate:  42 PR Interval:    QRS Duration: 142 QT Interval:  595 QTC Calculation: 498 R Axis:   99 Text Interpretation:  Junctional rhythm Nonspecific intraventricular conduction delay No old tracing to compare Confirmed by [place], Inocente Salles [phone]) on 07/07/2018 6:19:42 PM  Radiology Dg Chest Portable 1 View  Result Date: 07/07/2018 CLINICAL DATA:  Hypotensive, bradycardia. History of hypertension hyperlipidemia. EXAM: PORTABLE CHEST 1 VIEW COMPARISON:  None available for comparison at time of study interpretation. FINDINGS: Cardiomediastinal silhouette is normal. Calcified aortic arch. No pleural effusions or focal consolidations. Trachea projects midline and there is no pneumothorax. Soft tissue planes and included osseous structures are non-suspicious. Chronic deformity LEFT humeral head. IMPRESSION: 1. No acute cardiopulmonary process. 2.  Aortic Atherosclerosis (ICD10-I70.0). Electronically Signed   By: Elon Alas M.D.   On: 07/07/2018 19:22    Procedures Procedures (including critical care time)  CRITICAL CARE Performed by: Ozella Almond Lus Kriegel  Total critical care time: 35 minutes  Critical care time was exclusive of separately billable procedures and treating other patients.  Critical care was necessary to treat or prevent imminent or life-threatening deterioration.  Critical care was time spent personally by me on the following activities: development of treatment plan with patient and/or surrogate as well as nursing, discussions with consultants, evaluation of patient's response to treatment, examination of patient, obtaining history from patient or surrogate, ordering and performing treatments and interventions, ordering and review of laboratory studies, ordering and review of radiographic studies, pulse oximetry and re-evaluation of patient's condition.   Medications Ordered in ED Medications  allopurinol (ZYLOPRIM) tablet 300 mg (has no  administration in time range)  atorvastatin (LIPITOR) tablet 10 mg (has no administration in time range)  sertraline (ZOLOFT) tablet 100 mg (has no administration in time range)  pantoprazole (PROTONIX) EC tablet 40 mg (has no administration in time range)  heparin injection 5,000 Units (has no administration in time range)  sodium chloride flush (NS) 0.9 % injection 3 mL (has no administration in time range)  acetaminophen (TYLENOL) tablet 650 mg (has no administration in time range)    Or  acetaminophen (TYLENOL) suppository 650 mg (has no administration in time range)  ondansetron (ZOFRAN) tablet 4 mg (has no administration in time range)    Or  ondansetron (ZOFRAN) injection 4 mg (has no administration in time range)  atropine 1 MG/10ML injection 0.5 mg (has no administration in time range)  insulin aspart (novoLOG) injection 0-9 Units (has no administration in time range)  insulin aspart (novoLOG) injection 0-5 Units (has no administration in time range)  atropine 1 MG/10ML injection 0.5 mg (0.5 mg Intravenous Given 07/07/18 1823)     Initial Impression / Assessment and Plan / ED Course  I have reviewed the triage vital signs and the nursing notes.  Pertinent labs & imaging results that were available during my care of the patient were reviewed by me and considered in my medical decision making (see chart for details).    Emily Waller is a 84 y.o. female who presents to ED from facility for hypotension and bradycardia. HR of 38 and BP of 88/46 upon EMS arrival. Given dose of atropine in ED with improvement in HR to high 40's. BP improved to 111/47. Normal elytes. Troponin wdl. No recent infectious symptoms. EKG with bradycardia - junctional vs. Complete heart block. She appears to be asymptomatic. Confirmed DNR with daughter at bedside who is healthcare POA. Consulted cardiology, Dr. Margaretann Loveless, who recommends repeat EKG, admit to medicine and cardiology will see in consultation.    Patient seen by and discussed with Dr. Winfred Leeds who agrees with treatment plan.    Final Clinical Impressions(s) / ED Diagnoses   Final diagnoses:  Bradycardia    ED Discharge Orders    None  Keeon Zurn, Ozella Almond, PA-C 07/07/18 2036    Orlie Dakin, MD 07/08/18 [phone]

## 2018-07-08 ENCOUNTER — Observation Stay (HOSPITAL_BASED_OUTPATIENT_CLINIC_OR_DEPARTMENT_OTHER): Payer: Medicare Other

## 2018-07-08 ENCOUNTER — Other Ambulatory Visit: Payer: Self-pay

## 2018-07-08 DIAGNOSIS — Z8349 Family history of other endocrine, nutritional and metabolic diseases: Secondary | ICD-10-CM | POA: Diagnosis not present

## 2018-07-08 DIAGNOSIS — F0390 Unspecified dementia without behavioral disturbance: Secondary | ICD-10-CM | POA: Diagnosis present

## 2018-07-08 DIAGNOSIS — Z888 Allergy status to other drugs, medicaments and biological substances status: Secondary | ICD-10-CM | POA: Diagnosis not present

## 2018-07-08 DIAGNOSIS — N182 Chronic kidney disease, stage 2 (mild): Secondary | ICD-10-CM | POA: Diagnosis present

## 2018-07-08 DIAGNOSIS — Z955 Presence of coronary angioplasty implant and graft: Secondary | ICD-10-CM | POA: Diagnosis not present

## 2018-07-08 DIAGNOSIS — F329 Major depressive disorder, single episode, unspecified: Secondary | ICD-10-CM | POA: Diagnosis not present

## 2018-07-08 DIAGNOSIS — Z66 Do not resuscitate: Secondary | ICD-10-CM | POA: Diagnosis present

## 2018-07-08 DIAGNOSIS — Z79899 Other long term (current) drug therapy: Secondary | ICD-10-CM | POA: Diagnosis not present

## 2018-07-08 DIAGNOSIS — Z87440 Personal history of urinary (tract) infections: Secondary | ICD-10-CM | POA: Diagnosis not present

## 2018-07-08 DIAGNOSIS — E559 Vitamin D deficiency, unspecified: Secondary | ICD-10-CM | POA: Diagnosis present

## 2018-07-08 DIAGNOSIS — Z87891 Personal history of nicotine dependence: Secondary | ICD-10-CM | POA: Diagnosis not present

## 2018-07-08 DIAGNOSIS — I361 Nonrheumatic tricuspid (valve) insufficiency: Secondary | ICD-10-CM | POA: Diagnosis not present

## 2018-07-08 DIAGNOSIS — Z8249 Family history of ischemic heart disease and other diseases of the circulatory system: Secondary | ICD-10-CM | POA: Diagnosis not present

## 2018-07-08 DIAGNOSIS — Z801 Family history of malignant neoplasm of trachea, bronchus and lung: Secondary | ICD-10-CM | POA: Diagnosis not present

## 2018-07-08 DIAGNOSIS — R001 Bradycardia, unspecified: Secondary | ICD-10-CM

## 2018-07-08 DIAGNOSIS — F09 Unspecified mental disorder due to known physiological condition: Secondary | ICD-10-CM | POA: Diagnosis not present

## 2018-07-08 DIAGNOSIS — F0789 Other personality and behavioral disorders due to known physiological condition: Secondary | ICD-10-CM | POA: Diagnosis not present

## 2018-07-08 DIAGNOSIS — E785 Hyperlipidemia, unspecified: Secondary | ICD-10-CM | POA: Diagnosis present

## 2018-07-08 DIAGNOSIS — N390 Urinary tract infection, site not specified: Secondary | ICD-10-CM | POA: Diagnosis not present

## 2018-07-08 DIAGNOSIS — M2550 Pain in unspecified joint: Secondary | ICD-10-CM | POA: Diagnosis not present

## 2018-07-08 DIAGNOSIS — I351 Nonrheumatic aortic (valve) insufficiency: Secondary | ICD-10-CM

## 2018-07-08 DIAGNOSIS — E119 Type 2 diabetes mellitus without complications: Secondary | ICD-10-CM | POA: Diagnosis not present

## 2018-07-08 DIAGNOSIS — I2510 Atherosclerotic heart disease of native coronary artery without angina pectoris: Secondary | ICD-10-CM | POA: Diagnosis present

## 2018-07-08 DIAGNOSIS — Z7401 Bed confinement status: Secondary | ICD-10-CM | POA: Diagnosis not present

## 2018-07-08 DIAGNOSIS — R32 Unspecified urinary incontinence: Secondary | ICD-10-CM | POA: Diagnosis present

## 2018-07-08 DIAGNOSIS — I495 Sick sinus syndrome: Secondary | ICD-10-CM | POA: Diagnosis present

## 2018-07-08 DIAGNOSIS — G9341 Metabolic encephalopathy: Secondary | ICD-10-CM | POA: Diagnosis not present

## 2018-07-08 DIAGNOSIS — I10 Essential (primary) hypertension: Secondary | ICD-10-CM | POA: Diagnosis not present

## 2018-07-08 DIAGNOSIS — N179 Acute kidney failure, unspecified: Secondary | ICD-10-CM | POA: Diagnosis present

## 2018-07-08 DIAGNOSIS — E1122 Type 2 diabetes mellitus with diabetic chronic kidney disease: Secondary | ICD-10-CM | POA: Diagnosis present

## 2018-07-08 DIAGNOSIS — B961 Klebsiella pneumoniae [K. pneumoniae] as the cause of diseases classified elsewhere: Secondary | ICD-10-CM | POA: Diagnosis present

## 2018-07-08 DIAGNOSIS — I129 Hypertensive chronic kidney disease with stage 1 through stage 4 chronic kidney disease, or unspecified chronic kidney disease: Secondary | ICD-10-CM | POA: Diagnosis present

## 2018-07-08 LAB — URINALYSIS, ROUTINE W REFLEX MICROSCOPIC
Bacteria, UA: NONE SEEN
Bilirubin Urine: NEGATIVE
Glucose, UA: NEGATIVE mg/dL
Hgb urine dipstick: NEGATIVE
Ketones, ur: NEGATIVE mg/dL
Nitrite: NEGATIVE
PH: 5 (ref 5.0–8.0)
Protein, ur: NEGATIVE mg/dL
Specific Gravity, Urine: 1.01 (ref 1.005–1.030)

## 2018-07-08 LAB — CBC WITH DIFFERENTIAL/PLATELET
Abs Immature Granulocytes: 0.04 10*3/uL (ref 0.00–0.07)
Basophils Absolute: 0 10*3/uL (ref 0.0–0.1)
Basophils Relative: 0 %
Eosinophils Absolute: 0 10*3/uL (ref 0.0–0.5)
Eosinophils Relative: 0 %
HCT: 35.1 % — ABNORMAL LOW (ref 36.0–46.0)
HEMOGLOBIN: 11.4 g/dL — AB (ref 12.0–15.0)
Immature Granulocytes: 0 %
Lymphocytes Relative: 25 %
Lymphs Abs: 2.4 10*3/uL (ref 0.7–4.0)
MCH: 30.8 pg (ref 26.0–34.0)
MCHC: 32.5 g/dL (ref 30.0–36.0)
MCV: 94.9 fL (ref 80.0–100.0)
Monocytes Absolute: 0.6 10*3/uL (ref 0.1–1.0)
Monocytes Relative: 6 %
Neutro Abs: 6.5 10*3/uL (ref 1.7–7.7)
Neutrophils Relative %: 69 %
Platelets: 122 10*3/uL — ABNORMAL LOW (ref 150–400)
RBC: 3.7 MIL/uL — AB (ref 3.87–5.11)
RDW: 14.8 % (ref 11.5–15.5)
WBC: 9.6 10*3/uL (ref 4.0–10.5)
nRBC: 0 % (ref 0.0–0.2)

## 2018-07-08 LAB — BASIC METABOLIC PANEL
Anion gap: 14 (ref 5–15)
BUN: 35 mg/dL — ABNORMAL HIGH (ref 8–23)
CO2: 20 mmol/L — ABNORMAL LOW (ref 22–32)
Calcium: 8.7 mg/dL — ABNORMAL LOW (ref 8.9–10.3)
Chloride: 107 mmol/L (ref 98–111)
Creatinine, Ser: 1.82 mg/dL — ABNORMAL HIGH (ref 0.44–1.00)
GFR calc Af Amer: 28 mL/min — ABNORMAL LOW (ref >60)
GFR calc non Af Amer: 24 mL/min — ABNORMAL LOW (ref >60)
Glucose, Bld: 96 mg/dL (ref 70–99)
Potassium: 3.9 mmol/L (ref 3.5–5.1)
Sodium: 141 mmol/L (ref 135–145)

## 2018-07-08 LAB — GLUCOSE, CAPILLARY
Glucose-Capillary: 108 mg/dL — ABNORMAL HIGH (ref 70–99)
Glucose-Capillary: 109 mg/dL — ABNORMAL HIGH (ref 70–99)
Glucose-Capillary: 98 mg/dL (ref 70–99)
Glucose-Capillary: 125 mg/dL — ABNORMAL HIGH (ref 70–99)

## 2018-07-08 LAB — CREATININE, URINE, RANDOM: Creatinine, Urine: 76.82 mg/dL

## 2018-07-08 LAB — ECHOCARDIOGRAM COMPLETE
HEIGHTINCHES: 68 in
Weight: 2204.6 oz

## 2018-07-08 LAB — TSH: TSH: 2.33 u[IU]/mL (ref 0.350–4.500)

## 2018-07-08 LAB — MRSA PCR SCREENING: MRSA by PCR: NEGATIVE

## 2018-07-08 LAB — MAGNESIUM: Magnesium: 1.5 mg/dL — ABNORMAL LOW (ref 1.7–2.4)

## 2018-07-08 LAB — SODIUM, URINE, RANDOM: SODIUM UR: 96 mmol/L

## 2018-07-08 NOTE — Consult Note (Addendum)
ELECTROPHYSIOLOGY CONSULT NOTE    Patient ID: Emily Waller MRN: [id number], DOB/AGE: 82/30/1929 82 y.o.  Admit date: 07/07/2018 Date of Consult: 07/08/2018  Primary Physician: Orpah Melter, MD Primary Cardiologist: Oval Linsey Electrophysiologist: Lovena Le (new this admission)  Patient Profile: Emily Waller is a 84 y.o. female with a history of hypertension, diabetes, CKD, mild dementia who is being seen today for the evaluation of symptomatic bradycardia at the request of Dr Elson Areas.  HPI:  Emily Waller is a 84 y.o. female with the above past medical history. She saw Dr Oval Linsey in 2018 and was complaining of shortness of breath with exertion at that time. Lexiscan myoview was ordered but not obtained. She does not remember that visit and says most of her care has been through the [place]. She was noted to be fatigued and weak during dinner last night.  Vital signs were taken and she was noted to be bradycardic and hypotensive. She was transported to [facility] for further evaluation.  She is not able to tell me about symptoms from yesterday. She is oriented to person, thinks it is 59 but says "there are idiots in the [facility]".  She is pleasant and conversational but requires frequent redirection.  No prior cardiac imaging available for review.  She denies chest pain, palpitations, dyspnea, PND, orthopnea, nausea, vomiting, dizziness, syncope, edema, weight gain, or early satiety.  Past Medical History:  Diagnosis Date  . Anxiety   . Depression   . Diabetes mellitus without complication ([place])   . Hyperlipidemia   . Hypertension   . Renal disorder    Kidney disease  . Vitamin D deficiency      Surgical History: History reviewed. No pertinent surgical history.   Medications Prior to Admission  Medication Sig Dispense Refill Last Dose  . allopurinol (ZYLOPRIM) 300 MG tablet Take 300 mg by mouth daily.   07/07/2018 at Unknown time  . amLODipine (NORVASC) 5 MG tablet Take 5 mg by  mouth daily.   07/07/2018 at Unknown time  . atorvastatin (LIPITOR) 10 MG tablet Take 10 mg by mouth daily.   07/07/2018 at Unknown time  . ergocalciferol (VITAMIN D2) 1.25 MG (50000 UT) capsule Take 50,000 Units by mouth every Tuesday.   07/06/2018 at Unknown time  . ezetimibe (ZETIA) 10 MG tablet Take 10 mg by mouth daily.   07/07/2018 at Unknown time  . KRILL OIL PO Take 1 capsule by mouth daily.   07/07/2018 at Unknown time  . losartan-hydrochlorothiazide (HYZAAR) 50-12.5 MG tablet Take 1 tablet by mouth daily.   07/07/2018 at Unknown time  . oxybutynin (DITROPAN-XL) 10 MG 24 hr tablet Take 10 mg by mouth daily.   07/07/2018 at Unknown time  . pantoprazole (PROTONIX) 40 MG tablet Take 40 mg by mouth daily.   07/07/2018 at Unknown time  . sertraline (ZOLOFT) 100 MG tablet Take 100 mg by mouth daily.   07/07/2018 at Unknown time  . Turmeric 500 MG CAPS Take 500 mg by mouth daily.   07/07/2018 at Unknown time    Inpatient Medications:  . allopurinol  300 mg Oral Daily  . atorvastatin  10 mg Oral q1800  . insulin aspart  0-5 Units Subcutaneous QHS  . insulin aspart  0-9 Units Subcutaneous TID WC  . pantoprazole  40 mg Oral Daily  . sertraline  100 mg Oral Daily  . sodium chloride flush  3 mL Intravenous Q12H    Allergies:  Allergies  Allergen Reactions  . Colcrys [Colchicine]  Nausea Only    Stomach upset   . Lovastatin Other (See Comments)    myalgia    Social History   Socioeconomic History  . Marital status: Unknown    Spouse name: Not on file  . Number of children: Not on file  . Years of education: Not on file  . Highest education level: Not on file  Occupational History  . Not on file  Social Needs  . Financial resource strain: Not on file  . Food insecurity:    Worry: Not on file    Inability: Not on file  . Transportation needs:    Medical: Not on file    Non-medical: Not on file  Tobacco Use  . Smoking status: Former Smoker    Last attempt to quit:  10/02/1969    Years since quitting: 48.7  . Smokeless tobacco: Never Used  Substance and Sexual Activity  . Alcohol use: No    Alcohol/week: 0.0 standard drinks    Frequency: Never  . Drug use: No  . Sexual activity: Not Currently  Lifestyle  . Physical activity:    Days per week: Not on file    Minutes per session: Not on file  . Stress: Not on file  Relationships  . Social connections:    Talks on phone: Not on file    Gets together: Not on file    Attends religious service: Not on file    Active member of club or organization: Not on file    Attends meetings of clubs or organizations: Not on file    Relationship status: Not on file  . Intimate partner violence:    Fear of current or ex partner: Not on file    Emotionally abused: Not on file    Physically abused: Not on file    Forced sexual activity: Not on file  Other Topics Concern  . Not on file  Social History Narrative  . Not on file     Family History  Problem Relation Age of Onset  . Hyperlipidemia Mother   . Hypertension Mother   . Cancer Father   . Heart disease Father   . CAD Maternal Grandmother   . CAD Maternal Grandfather   . Lung cancer Sister      Review of Systems: All other systems reviewed and are otherwise negative except as noted above.  Physical Exam: Vitals:   07/07/18 2220 07/07/18 2223 07/07/18 2316 07/08/18 0334  BP:  (!) 96/38 (!) 104/45 (!) 123/58  Pulse: (!) 39     Resp: 14 14 20 15   Temp: 98 F (36.7 C)  (!) 97.4 F (36.3 C) 97.8 F (36.6 C)  TempSrc: Oral  Oral   SpO2:      Weight: 62.9 kg   62.5 kg  Height: 5\' 8"  (1.727 m)       GEN- The patient is elderly and frail appearing, alert and oriented x 1 today  HEENT: normocephalic, atraumatic; sclera clear, conjunctiva pink; hearing intact; oropharynx clear; neck supple Lungs- Clear to ausculation bilaterally, normal work of breathing.  No wheezes, rales, rhonchi Heart- Regular rate and rhythm  GI- soft, non-tender,  non-distended, bowel sounds present Extremities- no clubbing, cyanosis, or edema  MS- no significant deformity or atrophy Skin- warm and dry, no rash or lesion Psych- euthymic mood, full affect Neuro- strength and sensation are intact  Labs:   Lab Results  Component Value Date   WBC 9.6 07/08/2018   HGB 11.4 (L) 07/08/2018  HCT 35.1 (L) 07/08/2018   MCV 94.9 07/08/2018   PLT 122 (L) 07/08/2018    Recent Labs  Lab 07/07/18 1815  07/08/18 0450  NA 139   < > 141  K 4.4   < > 3.9  CL 106   < > 107  CO2 23  --  20*  BUN 31*   < > 35*  CREATININE 2.02*   < > 1.82*  CALCIUM 8.5*  --  8.7*  PROT 5.9*  --   --   BILITOT 0.5  --   --   ALKPHOS 57  --   --   ALT 18  --   --   AST 18  --   --   GLUCOSE 200*   < > 96   < > = values in this interval not displayed.      Radiology/Studies: Dg Chest Portable 1 View  Result Date: 07/07/2018 CLINICAL DATA:  Hypotensive, bradycardia. History of hypertension hyperlipidemia. EXAM: PORTABLE CHEST 1 VIEW COMPARISON:  None available for comparison at time of study interpretation. FINDINGS: Cardiomediastinal silhouette is normal. Calcified aortic arch. No pleural effusions or focal consolidations. Trachea projects midline and there is no pneumothorax. Soft tissue planes and included osseous structures are non-suspicious. Chronic deformity LEFT humeral head. IMPRESSION: 1. No acute cardiopulmonary process. 2.  Aortic Atherosclerosis (ICD10-I70.0). Electronically Signed   By: Elon Alas M.D.   On: 07/07/2018 19:22    JQB:HALP wavy baseline, but appears to be sinus bradycardia, rate 42 (personally reviewed)  TELEMETRY: sinus brady, sinus rhythm (personally reviewed)   Assessment/Plan: 1.  Sinus bradycardia Will need her daughter to provide further insight into symptoms and to have discussion about pacemaker. I attempted to call this morning but did not get an answer.  Ms Starzyk thinks she is coming in later today. Keep NPO for now She  is on no AVN blocking agents Echo pending.   Dr Lovena Le to see later today    For questions or updates, please contact [place] Please consult [email or web address] for contact info under Cardiology/STEMI.  Signed, Chanetta Marshall, NP 07/08/2018 7:39 AM  Daughter still not at hospital, I am not able to reach her by phone. Will try again later today  Chanetta Marshall, NP 07/08/2018 11:05 AM   Sinus node dysfunction  With junctional escape  Hypotension  Fatigue assoc with the above  UTI  Dementia   Discussed situation with the patient's daughter.  A singular event.  Not sure of the mechanism.  In the context of sinus bradycardia, hypotension seems excessive; in the context of junctional bradycardia is more consistent.  The question in the case of the latter is what triggered it.  She does have a urinary tract infection by urine culture; we will let primary care know in terms of antibiotic therapy.  From a rhythm issue, we have elected to follow it.  If she were to have recurrent episodes of weakness with documented sinus node dysfunction pacing would be reasonable.  Her dementia is moderate i.e. she knows where she is but her father is still alive, but she is also ambulatory albeit with a walker.  Hence, protecting her from falling would be appropriate.  Will sign off.  Call if we can be of further assistance.  [facility] will sign off.   Medication Recommendations:  none Other recommendations (labs, testing, etc):    Follow up as an outpatient: none needed

## 2018-07-08 NOTE — Progress Notes (Signed)
  Echocardiogram 2D Echocardiogram has been performed.  Emily Waller 07/08/2018, 10:34 AM

## 2018-07-08 NOTE — Progress Notes (Signed)
PROGRESS NOTE    Emily Waller  GYI:[id number] DOB: [birth year] DOA: 07/07/2018 PCP: Orpah Melter, MD  Brief Narrative: 85 y.o. female with medical history significant for dementia, hypertension, diet-controlled diabetes mellitus, and renal insufficiency, now presenting to the emergency department from her assisted living facility for evaluation of low heart rate and low blood pressure.  Patient had reportedly been in her usual state of health, was noted by ALF personnel to be generally weak and with increased confusion; this prompted a check of her vital signs which revealed heart rate in the upper 35K and systolic blood pressure in the upper 80s.  EMS was called, patient was given 500 cc normal saline, and brought into the ED for further evaluation.  She had complained of some dizziness, but denies any chest pain, fevers, chills, abdominal pain, cough, or headache.  Family is at the bedside and assists with the history.  She had not been complaining of anything leading up to this and does not have any history of similar problems. Cognition is at baseline per family.   ED Course: Upon arrival to the ED, patient is found to be bradycardic in the 09F, systolic blood pressure in the mid to upper 90s, and normal O2 saturations.  EKG features a junctional rhythm with rate 42 and nonspecific IVCD.  Chest x-ray is negative for acute cardiopulmonary disease.  Chemistry panel is notable for BUN of 31, glucose 200, and creatinine 2.02, up from 1.2 remotely.  CBC features a leukocytosis to 11,900 and troponin is normal.  Patient was treated with 0.5 mg of atropine in the ED. Cardiology was consulted by the ED physician and recommended a medical admission.  Assessment & Plan:   Principal Problem:   Symptomatic bradycardia Active Problems:   HTN (hypertension)   Depression   Renal insufficiency   Diet-controlled diabetes mellitus ([place])  1. Symptomatic bradycardia  - Presents from ALF with bradycardia  and hypotension  - She was treated with 500 cc NS by EMS, given 0.5 mg atropine x1  - SBP 90's and HR 40's with junctional rhythm in ED  - She had been generally weak and complained of dizziness prior to arrival in ED, but asymptomatic currently  - DR Lovena Le to see her today  - No evidence for ischemic etiology; does not appear to medication-related; there is a mild leukocytosis without infectious sxs but has hx of UTI's that manifest with gen weakness as she was experiencing earlier (though this seems to have resolved with improved BP)  - Continue cardiac monitoring, pacer at bedside, atropine as-needed,  ECHO DONE TODAY PENDING tsh normal  2. History of HTN  - SBP 80's with EMS, 90's in ED  - Hold antihypertensives for now    3. Renal insufficiency  - SCr is 2.01 on admission, uncertain baseline, was 1.2 in 2015  - Prerenal azotemia possible in setting of low BP  - She was given 500 cc NS with EMS  - Check urine chemistries, hold losartan-HCTZ, gentle IVF hydration, renally-dose medications, and repeat chem panel in am   -CREATININE improved to 1.82  4Type II DM  - Diet-controlled at her ALF, serum glucose is 200 in ED  - Check CBG's and use a SSI while in hospital    5. Depression  - Continue Zoloft       Estimated body mass index is 20.95 kg/m as calculated from the following:   Height as of this encounter: 5\' 8"  (1.727 m).   Weight as  of this encounter: 62.5 kg.  DVT prophylaxis: scd Code Status:dnr Family Communication:none Disposition Plan:  Pending clinical improvement  Consultants:   Cardiology  Procedures: None Antimicrobials: None  Subjective: Resting in bed denies any chest pain palpitation shortness of breath   Objective: Vitals:   07/07/18 2316 07/08/18 0334 07/08/18 0746 07/08/18 1118  BP: (!) 104/45 (!) 123/58 (!) 139/50 129/62  Pulse:   66 (!) 56  Resp: 20 15 17 18   Temp: (!) 97.4 F (36.3 C) 97.8 F (36.6 C) 97.8 F (36.6 C) 97.7 F  (36.5 C)  TempSrc: Oral  Oral Oral  SpO2:   100% 98%  Weight:  62.5 kg    Height:        Intake/Output Summary (Last 24 hours) at 07/08/2018 1305 Last data filed at 07/08/2018 1258 Gross per 24 hour  Intake 821.45 ml  Output 750 ml  Net 71.45 ml   Filed Weights   07/07/18 2220 07/08/18 0334  Weight: 62.9 kg 62.5 kg    Examination:  General exam: Appears calm and comfortable  Respiratory system: Clear to auscultation. Respiratory effort normal. Cardiovascular system: S1 & S2 heard, RRR. No JVD, murmurs, rubs, gallops or clicks. No pedal edema. Gastrointestinal system: Abdomen is nondistended, soft and nontender. No organomegaly or masses felt. Normal bowel sounds heard. Central nervous system: Alert and oriented. No focal neurological deficits. Extremities: trace edema Skin: No rashes, lesions or ulcers Psychiatry: Judgement and insight appear normal. Mood & affect appropriate.     CBC: Recent Labs  Lab 07/07/18 1815 07/07/18 1820 07/08/18 0354  WBC 11.9*  --  9.6  NEUTROABS  --   --  6.5  HGB 11.9* 12.9 11.4*  HCT 37.2 38.0 35.1*  MCV 97.9  --  94.9  PLT 157  --  376*   Basic Metabolic Panel: Recent Labs  Lab 07/07/18 1815 07/07/18 1820 07/08/18 0450  NA 139 139 141  K 4.4 4.3 3.9  CL 106 107 107  CO2 23  --  20*  GLUCOSE 200* 189* 96  BUN 31* 32* 35*  CREATININE 2.02* 2.10* 1.82*  CALCIUM 8.5*  --  8.7*  MG  --   --  1.5*   GFR: Estimated Creatinine Clearance: 21.1 mL/min (A) (by C-G formula based on SCr of 1.82 mg/dL (H)). Liver Function Tests: Recent Labs  Lab 07/07/18 1815  AST 18  ALT 18  ALKPHOS 57  BILITOT 0.5  PROT 5.9*  ALBUMIN 3.5   No results for input(s): LIPASE, AMYLASE in the last 168 hours. No results for input(s): AMMONIA in the last 168 hours. Coagulation Profile: No results for input(s): INR, PROTIME in the last 168 hours. Cardiac Enzymes: No results for input(s): CKTOTAL, CKMB, CKMBINDEX, TROPONINI in the last 168  hours. BNP (last 3 results) No results for input(s): PROBNP in the last 8760 hours. HbA1C: No results for input(s): HGBA1C in the last 72 hours. CBG: Recent Labs  Lab 07/07/18 2256 07/08/18 0743 07/08/18 1116  GLUCAP 144* 108* 109*   Lipid Profile: No results for input(s): CHOL, HDL, LDLCALC, TRIG, CHOLHDL, LDLDIRECT in the last 72 hours. Thyroid Function Tests: Recent Labs    07/08/18 0355  TSH 2.330   Anemia Panel: No results for input(s): VITAMINB12, FOLATE, FERRITIN, TIBC, IRON, RETICCTPCT in the last 72 hours. Sepsis Labs: No results for input(s): PROCALCITON, LATICACIDVEN in the last 168 hours.  Recent Results (from the past 240 hour(s))  MRSA PCR Screening     Status: None  Collection Time: 07/07/18 11:15 PM  Result Value Ref Range Status   MRSA by PCR NEGATIVE NEGATIVE Final    Comment:        The GeneXpert MRSA Assay (FDA approved for NASAL specimens only), is one component of a comprehensive MRSA colonization surveillance program. It is not intended to diagnose MRSA infection nor to guide or monitor treatment for MRSA infections. Performed at [facility], [place] [address], [place], [place] [address]          Radiology Studies: Dg Chest Portable 1 View  Result Date: 07/07/2018 CLINICAL DATA:  Hypotensive, bradycardia. History of hypertension hyperlipidemia. EXAM: PORTABLE CHEST 1 VIEW COMPARISON:  None available for comparison at time of study interpretation. FINDINGS: Cardiomediastinal silhouette is normal. Calcified aortic arch. No pleural effusions or focal consolidations. Trachea projects midline and there is no pneumothorax. Soft tissue planes and included osseous structures are non-suspicious. Chronic deformity LEFT humeral head. IMPRESSION: 1. No acute cardiopulmonary process. 2.  Aortic Atherosclerosis (ICD10-I70.0). Electronically Signed   By: Elon Alas M.D.   On: 07/07/2018 19:22        Scheduled Meds: . allopurinol  300 mg  Oral Daily  . atorvastatin  10 mg Oral q1800  . insulin aspart  0-5 Units Subcutaneous QHS  . insulin aspart  0-9 Units Subcutaneous TID WC  . pantoprazole  40 mg Oral Daily  . sertraline  100 mg Oral Daily  . sodium chloride flush  3 mL Intravenous Q12H   Continuous Infusions:   LOS: 0 days     Georgette Shell, MD [facility]  If 7PM-7AM, please contact night-coverage [email or web address] Password [email or web address] 07/08/2018, 1:05 PM

## 2018-07-09 DIAGNOSIS — F329 Major depressive disorder, single episode, unspecified: Secondary | ICD-10-CM

## 2018-07-09 DIAGNOSIS — E119 Type 2 diabetes mellitus without complications: Secondary | ICD-10-CM

## 2018-07-09 DIAGNOSIS — F0789 Other personality and behavioral disorders due to known physiological condition: Secondary | ICD-10-CM

## 2018-07-09 DIAGNOSIS — F09 Unspecified mental disorder due to known physiological condition: Secondary | ICD-10-CM

## 2018-07-09 DIAGNOSIS — I10 Essential (primary) hypertension: Secondary | ICD-10-CM

## 2018-07-09 LAB — GLUCOSE, CAPILLARY
GLUCOSE-CAPILLARY: 102 mg/dL — AB (ref 70–99)
Glucose-Capillary: 238 mg/dL — ABNORMAL HIGH (ref 70–99)
Glucose-Capillary: 116 mg/dL — ABNORMAL HIGH (ref 70–99)
Glucose-Capillary: 122 mg/dL — ABNORMAL HIGH (ref 70–99)

## 2018-07-09 MED ORDER — SERTRALINE HCL 50 MG PO TABS
50.0000 mg | ORAL_TABLET | Freq: Every day | ORAL | Status: DC
  Administered 2018-07-10 – 2018-07-11 (×2): 50 mg via ORAL
  Filled 2018-07-09 (×2): qty 1

## 2018-07-09 MED ORDER — SODIUM CHLORIDE 0.9 % IV SOLN
1.0000 g | INTRAVENOUS | Status: DC
  Administered 2018-07-09: 1 g via INTRAVENOUS
  Filled 2018-07-09: qty 10

## 2018-07-09 NOTE — Progress Notes (Addendum)
PROGRESS NOTE    Emily Waller  PIR:[id number]  DOB: Mar 30, 1928  DOA: 07/07/2018 PCP: Orpah Melter, MD  Brief Narrative:  84 y.o.femalewith medical history significant fordementia, hypertension, diet-controlled diabetes mellitus, and renal insufficiency, presented to the emergency department on 12/18 from ALF with AMS/weakness/dizziness and  for evaluation of low heart rate (30s)and low blood pressure(SBP 80s). Upon arrival to the ED, patient is found to be bradycardic in the 63K, systolic blood pressure in the mid to upper 90s, and normal O2 saturations.  EKG features a junctional rhythm with rate 42 and nonspecific IVCD.  Chest x-ray is negative for acute cardiopulmonary disease.  Chemistry panel is notable for BUN of 31, glucose 200, and creatinine 2.02, up from 1.2 remotely.  CBC features a leukocytosis to 11,900 and troponin normal.  Patient was treated with 500 ml NS enroute by EMS and 0.5 mg of atropine in the ED. Cardiology was consulted. Patient not on AV blockers at ALF and family in the ED felt her cognition was at baseline.     Subjective: Patient sleeping comfortably this morning. Has mittens in place. Oriented to person and was able to tell me her DOB but not the current month/year. She knew Christmas is coming up. She is disoriented to place. She is pleasant to converse with and denies any chest pain/sob. HR 66 on monitor   Objective: Vitals:   07/08/18 2026 07/08/18 2328 07/09/18 0320 07/09/18 0759  BP:  (!) 126/51 (!) 142/55 134/65  Pulse:    (!) 56  Resp:  16 15 16   Temp: 98.6 F (37 C) 97.9 F (36.6 C) 97.7 F (36.5 C) 98.1 F (36.7 C)  TempSrc: Oral Oral  Oral  SpO2:    97%  Weight:   61.2 kg   Height:        Intake/Output Summary (Last 24 hours) at 07/09/2018 0934 Last data filed at 07/08/2018 1500 Gross per 24 hour  Intake 0 ml  Output 850 ml  Net -850 ml   Filed Weights   07/07/18 2220 07/08/18 0334 07/09/18 0320  Weight: 62.9 kg 62.5 kg 61.2  kg    Physical Examination:  General exam: Appears calm and comfortable  Respiratory system: Clear to auscultation. Respiratory effort normal. Cardiovascular system: S1 & S2 heard, RRR. No JVD, murmurs, rubs, gallops or clicks. No pedal edema. Gastrointestinal system: Abdomen is nondistended, soft and nontender. No organomegaly or masses felt. Normal bowel sounds heard. Central nervous system: Alert and oriented x1. No focal neurological deficits. Extremities: Symmetric 5 x 5 power. Skin: No rashes, lesions or ulcers Psychiatry: Judgement and insight appear impaired. Mood & affect appropriate.     Data Reviewed: I have personally reviewed following labs and imaging studies  CBC: Recent Labs  Lab 07/07/18 1815 07/07/18 1820 07/08/18 0354  WBC 11.9*  --  9.6  NEUTROABS  --   --  6.5  HGB 11.9* 12.9 11.4*  HCT 37.2 38.0 35.1*  MCV 97.9  --  94.9  PLT 157  --  160*   Basic Metabolic Panel: Recent Labs  Lab 07/07/18 1815 07/07/18 1820 07/08/18 0450  NA 139 139 141  K 4.4 4.3 3.9  CL 106 107 107  CO2 23  --  20*  GLUCOSE 200* 189* 96  BUN 31* 32* 35*  CREATININE 2.02* 2.10* 1.82*  CALCIUM 8.5*  --  8.7*  MG  --   --  1.5*   GFR: Estimated Creatinine Clearance: 20.6 mL/min (A) (by C-G formula  based on SCr of 1.82 mg/dL (H)). Liver Function Tests: Recent Labs  Lab 07/07/18 1815  AST 18  ALT 18  ALKPHOS 57  BILITOT 0.5  PROT 5.9*  ALBUMIN 3.5   No results for input(s): LIPASE, AMYLASE in the last 168 hours. No results for input(s): AMMONIA in the last 168 hours. Coagulation Profile: No results for input(s): INR, PROTIME in the last 168 hours. Cardiac Enzymes: No results for input(s): CKTOTAL, CKMB, CKMBINDEX, TROPONINI in the last 168 hours. BNP (last 3 results) No results for input(s): PROBNP in the last 8760 hours. HbA1C: No results for input(s): HGBA1C in the last 72 hours. CBG: Recent Labs  Lab 07/08/18 0743 07/08/18 1116 07/08/18 1659  07/08/18 2137 07/09/18 0757  GLUCAP 108* 109* 98 125* 102*   Lipid Profile: No results for input(s): CHOL, HDL, LDLCALC, TRIG, CHOLHDL, LDLDIRECT in the last 72 hours. Thyroid Function Tests: Recent Labs    07/08/18 0355  TSH 2.330   Anemia Panel: No results for input(s): VITAMINB12, FOLATE, FERRITIN, TIBC, IRON, RETICCTPCT in the last 72 hours. Sepsis Labs: No results for input(s): PROCALCITON, LATICACIDVEN in the last 168 hours.  Recent Results (from the past 240 hour(s))  MRSA PCR Screening     Status: None   Collection Time: 07/07/18 11:15 PM  Result Value Ref Range Status   MRSA by PCR NEGATIVE NEGATIVE Final    Comment:        The GeneXpert MRSA Assay (FDA approved for NASAL specimens only), is one component of a comprehensive MRSA colonization surveillance program. It is not intended to diagnose MRSA infection nor to guide or monitor treatment for MRSA infections. Performed at [facility], [place] [address] [place], [place] [address]   Urine culture     Status: Abnormal (Preliminary result)   Collection Time: 07/08/18  7:10 AM  Result Value Ref Range Status   Specimen Description URINE, RANDOM  Final   Special Requests   Final    NONE Performed at [facility], [place] [address] [place], [place] [address]    Culture >=100,000 COLONIES/mL GRAM NEGATIVE RODS (A)  Final   Report Status PENDING  Incomplete      Radiology Studies: Dg Chest Portable 1 View  Result Date: 07/07/2018 CLINICAL DATA:  Hypotensive, bradycardia. History of hypertension hyperlipidemia. EXAM: PORTABLE CHEST 1 VIEW COMPARISON:  None available for comparison at time of study interpretation. FINDINGS: Cardiomediastinal silhouette is normal. Calcified aortic arch. No pleural effusions or focal consolidations. Trachea projects midline and there is no pneumothorax. Soft tissue planes and included osseous structures are non-suspicious. Chronic deformity LEFT humeral head. IMPRESSION: 1.  No acute cardiopulmonary process. 2.  Aortic Atherosclerosis (ICD10-I70.0). Electronically Signed   By: Elon Alas M.D.   On: 07/07/2018 19:22        Scheduled Meds: . allopurinol  300 mg Oral Daily  . atorvastatin  10 mg Oral q1800  . insulin aspart  0-5 Units Subcutaneous QHS  . insulin aspart  0-9 Units Subcutaneous TID WC  . pantoprazole  40 mg Oral Daily  . sertraline  100 mg Oral Daily  . sodium chloride flush  3 mL Intravenous Q12H   Continuous Infusions:  Assessment & Plan:    1. Symptomatic junctional bradycardia with hypotension: POA. HR and BP now improved, off IV fluids. Not on AV blockers at baseline. HCTZ/lisinopril still on hold. Seen by cardiology and EP who is to d/w daughter regarding pacemaker. Echo shows EF 60%, grade 2  diastolic dysfunction and moderate MR. Of note, patient on Zoloft, will reduce dosage and monitor HR/mental status  2. HTN: BP meds on hold due to #1  3. CKD stage 2 : SCr is 2.01 on admission, uncertain baseline, was 1.2 in 2015. Improved to 1.8 off Diuretics/ACEI  4. DM -2: Diet-controlled at her ALF, serum glucose is 200 in ED. SSI for now. Likely to be NPO for pacemaker.   5. Depression: on Zoloft  6. UTI: urine cx positive for Klebsiella. Added IV rocephin. No white count/fever , not sure if this could have contributed to her mental status changes on admission.    DVT prophylaxis: SCD. Will order Lovenox, hold  if planned for pacemaker Code Status: DNR Family / Patient Communication: Cardiology d/w family Disposition Plan: Back to ALF likely when medically clear      LOS: 1 day    Time spent: 35 minutes    Guilford Shi, MD [facility] Pager [phone]  If 7PM-7AM, please contact night-coverage [email or web address] Password [facility] 07/09/2018, 9:34 AM

## 2018-07-09 NOTE — Progress Notes (Signed)
Paged MD about patients positive urine culture. No orders placed.  RN will continue to monitor.

## 2018-07-10 DIAGNOSIS — N390 Urinary tract infection, site not specified: Secondary | ICD-10-CM

## 2018-07-10 DIAGNOSIS — G9341 Metabolic encephalopathy: Secondary | ICD-10-CM

## 2018-07-10 LAB — BASIC METABOLIC PANEL
Anion gap: 10 (ref 5–15)
BUN: 20 mg/dL (ref 8–23)
CO2: 25 mmol/L (ref 22–32)
CREATININE: 1.1 mg/dL — AB (ref 0.44–1.00)
Calcium: 9.1 mg/dL (ref 8.9–10.3)
Chloride: 104 mmol/L (ref 98–111)
GFR, EST AFRICAN AMERICAN: 52 mL/min — AB (ref >60)
GFR, EST NON AFRICAN AMERICAN: 45 mL/min — AB (ref >60)
Glucose, Bld: 122 mg/dL — ABNORMAL HIGH (ref 70–99)
Potassium: 4.2 mmol/L (ref 3.5–5.1)
Sodium: 139 mmol/L (ref 135–145)

## 2018-07-10 LAB — GLUCOSE, CAPILLARY
Glucose-Capillary: 118 mg/dL — ABNORMAL HIGH (ref 70–99)
Glucose-Capillary: 197 mg/dL — ABNORMAL HIGH (ref 70–99)
Glucose-Capillary: 110 mg/dL — ABNORMAL HIGH (ref 70–99)
Glucose-Capillary: 155 mg/dL — ABNORMAL HIGH (ref 70–99)

## 2018-07-10 LAB — CBC
HCT: 37.1 % (ref 36.0–46.0)
Hemoglobin: 12.2 g/dL (ref 12.0–15.0)
MCH: 31 pg (ref 26.0–34.0)
MCHC: 32.9 g/dL (ref 30.0–36.0)
MCV: 94.2 fL (ref 80.0–100.0)
PLATELETS: 123 10*3/uL — AB (ref 150–400)
RBC: 3.94 MIL/uL (ref 3.87–5.11)
RDW: 14.1 % (ref 11.5–15.5)
WBC: 10.3 10*3/uL (ref 4.0–10.5)
nRBC: 0 % (ref 0.0–0.2)

## 2018-07-10 LAB — URINE CULTURE: Culture: >=100000 — AB

## 2018-07-10 MED ORDER — CEPHALEXIN 500 MG PO CAPS
500.0000 mg | ORAL_CAPSULE | Freq: Two times a day (BID) | ORAL | Status: DC
  Administered 2018-07-10 – 2018-07-11 (×3): 500 mg via ORAL
  Filled 2018-07-10 (×3): qty 1

## 2018-07-10 NOTE — Plan of Care (Signed)

## 2018-07-10 NOTE — Progress Notes (Signed)
Abx management:  Urine culture came back with klebsiella that is sens to cefazolin. D/w Dr. Earnest Conroy, we will narrow to keflex to complete 5d of total abx.  Onnie Boer, PharmD, BCIDP, AAHIVP, CPP Infectious Disease Pharmacist 07/10/2018 9:01 AM

## 2018-07-10 NOTE — Progress Notes (Signed)
PROGRESS NOTE    Emily Waller  DEY:[id number]  DOB: [birth year]  DOA: 07/07/2018 PCP: Orpah Melter, MD  Brief Narrative:  84 y.o.femalewith medical history significant fordementia, hypertension, diet-controlled diabetes mellitus, and renal insufficiency, presented to the emergency department on 12/18 from ALF with AMS/weakness/dizziness and  for evaluation of low heart rate (30s)and low blood pressure(SBP 80s). Upon arrival to the ED, patient is found to be bradycardic in the 31S, systolic blood pressure in the mid to upper 90s, and normal O2 saturations.  EKG features a junctional rhythm with rate 42 and nonspecific IVCD.  Chest x-ray is negative for acute cardiopulmonary disease.  Chemistry panel is notable for BUN of 31, glucose 200, and creatinine 2.02, up from 1.2 remotely.  CBC features a leukocytosis to 11,900 and troponin normal.  Patient was treated with 500 ml NS enroute by EMS and 0.5 mg of atropine in the ED. Cardiology was consulted. Patient not on AV blockers at ALF and family in the ED felt her cognition was at baseline.     Subjective: Patient sleeping comfortably this morning. Per nurse had some confusion last night. Very pleasant and oriented to place person and time (was able to tell me the month and DOB)  Objective: Vitals:   07/10/18 0300 07/10/18 0500 07/10/18 0729 07/10/18 0800  BP: (!) 161/57  (!) 156/59   Pulse: 71  64   Resp: 18  17 19   Temp: 98.2 F (36.8 C)  97.9 F (36.6 C)   TempSrc: Oral  Oral   SpO2: 98%  98%   Weight:  64.6 kg    Height:        Intake/Output Summary (Last 24 hours) at 07/10/2018 1137 Last data filed at 07/10/2018 0731 Gross per 24 hour  Intake 100 ml  Output 1400 ml  Net -1300 ml   Filed Weights   07/08/18 0334 07/09/18 0320 07/10/18 0500  Weight: 62.5 kg 61.2 kg 64.6 kg    Physical Examination:  General exam: Appears calm and comfortable  Respiratory system: Clear to auscultation. Respiratory effort  normal. Cardiovascular system: S1 & S2 heard, RRR. No JVD, murmurs, rubs, gallops or clicks. No pedal edema. Gastrointestinal system: Abdomen is nondistended, soft and nontender. No organomegaly or masses felt. Normal bowel sounds heard. Central nervous system: Alert and oriented x1. No focal neurological deficits. Extremities: Symmetric 5 x 5 power. Skin: No rashes, lesions or ulcers Psychiatry: Judgement and insight appear impaired. Mood & affect appropriate.     Data Reviewed: I have personally reviewed following labs and imaging studies  CBC: Recent Labs  Lab 07/07/18 1815 07/07/18 1820 07/08/18 0354 07/10/18 0916  WBC 11.9*  --  9.6 10.3  NEUTROABS  --   --  6.5  --   HGB 11.9* 12.9 11.4* 12.2  HCT 37.2 38.0 35.1* 37.1  MCV 97.9  --  94.9 94.2  PLT 157  --  122* 970*   Basic Metabolic Panel: Recent Labs  Lab 07/07/18 1815 07/07/18 1820 07/08/18 0450 07/10/18 0916  NA 139 139 141 139  K 4.4 4.3 3.9 4.2  CL 106 107 107 104  CO2 23  --  20* 25  GLUCOSE 200* 189* 96 122*  BUN 31* 32* 35* 20  CREATININE 2.02* 2.10* 1.82* 1.10*  CALCIUM 8.5*  --  8.7* 9.1  MG  --   --  1.5*  --    GFR: Estimated Creatinine Clearance: 35.7 mL/min (A) (by C-G formula based on SCr of 1.1 mg/dL (  H)). Liver Function Tests: Recent Labs  Lab 07/07/18 1815  AST 18  ALT 18  ALKPHOS 57  BILITOT 0.5  PROT 5.9*  ALBUMIN 3.5   No results for input(s): LIPASE, AMYLASE in the last 168 hours. No results for input(s): AMMONIA in the last 168 hours. Coagulation Profile: No results for input(s): INR, PROTIME in the last 168 hours. Cardiac Enzymes: No results for input(s): CKTOTAL, CKMB, CKMBINDEX, TROPONINI in the last 168 hours. BNP (last 3 results) No results for input(s): PROBNP in the last 8760 hours. HbA1C: No results for input(s): HGBA1C in the last 72 hours. CBG: Recent Labs  Lab 07/09/18 0757 07/09/18 1136 07/09/18 1553 07/09/18 2141 07/10/18 0726  GLUCAP 102* 238* 116*  122* 118*   Lipid Profile: No results for input(s): CHOL, HDL, LDLCALC, TRIG, CHOLHDL, LDLDIRECT in the last 72 hours. Thyroid Function Tests: Recent Labs    07/08/18 0355  TSH 2.330   Anemia Panel: No results for input(s): VITAMINB12, FOLATE, FERRITIN, TIBC, IRON, RETICCTPCT in the last 72 hours. Sepsis Labs: No results for input(s): PROCALCITON, LATICACIDVEN in the last 168 hours.  Recent Results (from the past 240 hour(s))  MRSA PCR Screening     Status: None   Collection Time: 07/07/18 11:15 PM  Result Value Ref Range Status   MRSA by PCR NEGATIVE NEGATIVE Final    Comment:        The GeneXpert MRSA Assay (FDA approved for NASAL specimens only), is one component of a comprehensive MRSA colonization surveillance program. It is not intended to diagnose MRSA infection nor to guide or monitor treatment for MRSA infections. Performed at [facility], [place] [address], [place], [place] [address]   Urine culture     Status: Abnormal   Collection Time: 07/08/18  7:10 AM  Result Value Ref Range Status   Specimen Description URINE, RANDOM  Final   Special Requests   Final    NONE Performed at [facility], [place] [address] [place], [place] [address]    Culture >=100,000 COLONIES/mL KLEBSIELLA PNEUMONIAE (A)  Final   Report Status 07/10/2018 FINAL  Final   Organism ID, Bacteria KLEBSIELLA PNEUMONIAE (A)  Final      Susceptibility   Klebsiella pneumoniae - MIC*    AMPICILLIN RESISTANT Resistant     CEFAZOLIN <=4 SENSITIVE Sensitive     CEFTRIAXONE <=1 SENSITIVE Sensitive     CIPROFLOXACIN <=0.25 SENSITIVE Sensitive     GENTAMICIN <=1 SENSITIVE Sensitive     IMIPENEM <=0.25 SENSITIVE Sensitive     NITROFURANTOIN 64 INTERMEDIATE Intermediate     TRIMETH/SULFA <=20 SENSITIVE Sensitive     AMPICILLIN/SULBACTAM 4 SENSITIVE Sensitive     PIP/TAZO <=4 SENSITIVE Sensitive     Extended ESBL NEGATIVE Sensitive     * >=100,000 COLONIES/mL KLEBSIELLA PNEUMONIAE       Radiology Studies: No results found.      Scheduled Meds: . allopurinol  300 mg Oral Daily  . atorvastatin  10 mg Oral q1800  . cephALEXin  500 mg Oral Q12H  . insulin aspart  0-5 Units Subcutaneous QHS  . insulin aspart  0-9 Units Subcutaneous TID WC  . pantoprazole  40 mg Oral Daily  . sertraline  50 mg Oral Daily  . sodium chloride flush  3 mL Intravenous Q12H   Continuous Infusions:  Assessment & Plan:    1. Acute metabolic encephalopathy: POA. Secondary to UTI vs bradycardia. No fever or white count but urine cx grew klebsiella  with significant CFU. Added IV rocephin yesterday and transition to oral abx today.   2. Symptomatic junctional bradycardia with hypotension: POA. HR and BP now improved, off IV fluids. Not on AV blockers at baseline. HCTZ/lisinopril still on hold. Seen by cardiology and EP who is  d/w Emily Waller regarding pacemaker. Not sure if that is still on the table as patients HR is now improved and presenting symptoms could have been secondary to #1. Echo shows EF 01%, grade 2 diastolic dysfunction and moderate MR. Of note, patient on Zoloft 100mg  at baseline, reduced dosage and monitoring HR/mental status  3. CKD stage 2 : SCr is 2.01 on admission, uncertain baseline, was 1.2 in 2015. Improved to 1.8 off Diuretics/ACEI  4. DM -2: Diet-controlled at her ALF, serum glucose is 200 in ED. SSI for now. Likely to be NPO for pacemaker.   5. Depression: Reduced Zoloft dose and tolerating well. HR improved  6. HTN: BP meds on hold due to # 2   DVT prophylaxis: SCD. Will order Lovenox, hold  if planned for pacemaker Code Status: DNR Family / Patient Communication: Cardiology d/w family Disposition Plan: Back to ALF likely on monday     LOS: 2 days    Time spent: 35 minutes    Guilford Shi, MD [facility] Pager [phone]  If 7PM-7AM, please contact night-coverage [email or web address] Password [facility] 07/10/2018, 11:37 AM

## 2018-07-11 LAB — GLUCOSE, CAPILLARY
Glucose-Capillary: 99 mg/dL (ref 70–99)
Glucose-Capillary: 93 mg/dL (ref 70–99)

## 2018-07-11 MED ORDER — SERTRALINE HCL 50 MG PO TABS: 50.0000 mg | ORAL_TABLET | Freq: Every day | ORAL | 0 refills | Status: DC

## 2018-07-11 MED ORDER — CEPHALEXIN 500 MG PO CAPS: 500.0000 mg | ORAL_CAPSULE | Freq: Two times a day (BID) | ORAL | Status: AC

## 2018-07-11 NOTE — Progress Notes (Signed)
Pt d/c to [name] by [facility].  Daughter [name] present during transfer. Pt AVS sent to facility by [facility]. Prescription given to daughter to provide to facility.  All belongings sent with pt.  Pt VSS stable at d/c. IV removed per order.

## 2018-07-11 NOTE — Progress Notes (Signed)
Called [facility] to provide report.  Spoke with [place] who stated she had already received report and no further information was needed. Awaiting PTAR to transfer.

## 2018-07-11 NOTE — Evaluation (Addendum)
Physical Therapy Evaluation Patient Details Name: Emily Waller MRN: [id number] DOB: [birth year] Today's Date: 07/11/2018   History of Present Illness  Pt is a 84 y.o. F with significant PMH of dementia, hypertension, diet controlled diabetes mellitus, renal insufficiency who presents on 12/18 from ALF with AMS/weakness/dizziness and for evaluation of low heart rate (30s) and low blood pressure (SBP 80s).  Clinical Impression  Prior to admission, pt resides in ALF and has history of dementia. Most likely close to her functional baseline/cognition, but no family/caregiver there to confirm. She does report history of frequent falling. On PT evaluation, patient ambulating 200 feet with walker and min assist for balance. HR 63-101 bpm. Recommending return to ALF with 24 hour assistance. Could benefit from HHPT if she has had increased frequency of falling recently for balance training.    Follow Up Recommendations Home health PT;Supervision/Assistance - 24 hour (return to ALF)    Equipment Recommendations  None recommended by PT    Recommendations for Other Services       Precautions / Restrictions Precautions Precautions: Fall Restrictions Weight Bearing Restrictions: No      Mobility  Bed Mobility               General bed mobility comments: OOB in chair  Transfers Overall transfer level: Needs assistance Equipment used: Rolling walker (2 wheeled) Transfers: Sit to/from Stand Sit to Stand: Supervision         General transfer comment: supervision for safety  Ambulation/Gait Ambulation/Gait assistance: Min assist Gait Distance (Feet): 200 Feet Assistive device: Rolling walker (2 wheeled) Gait Pattern/deviations: Step-through pattern;Narrow base of support;Trunk flexed;Decreased stride length Gait velocity: decreased   General Gait Details: Requiring manual assistance for negotiation of walker as patient consistently veering into the right wall. no overt LOB    Stairs            Wheelchair Mobility    Modified Rankin (Stroke Patients Only)       Balance Overall balance assessment: Needs assistance Sitting-balance support: Feet supported Sitting balance-Leahy Scale: Good     Standing balance support: No upper extremity supported;During functional activity Standing balance-Leahy Scale: Fair Standing balance comment: able to perform standing functional reach task with supervision                             Pertinent Vitals/Pain Pain Assessment: No/denies pain    Home Living Family/patient expects to be discharged to:: Assisted living([facility])                      Prior Function Level of Independence: Needs assistance   Gait / Transfers Assistance Needed: pt reports history of falls?  ADL's / Homemaking Assistance Needed: likely needs assist for ADL's at baseline  Comments: Pt unable to provide PLOF.      Hand Dominance        Extremity/Trunk Assessment   Upper Extremity Assessment Upper Extremity Assessment: Overall WFL for tasks assessed    Lower Extremity Assessment Lower Extremity Assessment: Overall WFL for tasks assessed       Communication   Communication: No difficulties  Cognition Arousal/Alertness: Awake/alert Behavior During Therapy: WFL for tasks assessed/performed Overall Cognitive Status: History of cognitive impairments - at baseline                                 General Comments: Dementia  at baseline. Pt pleasant and oriented to self and time, not place. Unable to state where she lives. Follows all simple commands consistently      General Comments      Exercises     Assessment/Plan    PT Assessment Patient needs continued PT services  PT Problem List Decreased strength;Decreased activity tolerance;Decreased balance;Decreased mobility;Decreased cognition;Decreased safety awareness       PT Treatment Interventions DME  instruction;Gait training;Functional mobility training;Therapeutic activities;Therapeutic exercise;Balance training;Patient/family education    PT Goals (Current goals can be found in the Care Plan section)  Acute Rehab PT Goals Patient Stated Goal: "not fall anymore." PT Goal Formulation: With patient Time For Goal Achievement: 07/25/18 Potential to Achieve Goals: Fair    Frequency Min 3X/week   Barriers to discharge        Co-evaluation               AM-PAC PT "6 Clicks" Mobility  Outcome Measure Help needed turning from your back to your side while in a flat bed without using bedrails?: None Help needed moving from lying on your back to sitting on the side of a flat bed without using bedrails?: None Help needed moving to and from a bed to a chair (including a wheelchair)?: A Little Help needed standing up from a chair using your arms (e.g., wheelchair or bedside chair)?: None Help needed to walk in hospital room?: A Little Help needed climbing 3-5 steps with a railing? : A Lot 6 Click Score: 20    End of Session Equipment Utilized During Treatment: Gait belt Activity Tolerance: Patient tolerated treatment well Patient left: in chair;with call bell/phone within reach;with chair alarm set Nurse Communication: Mobility status PT Visit Diagnosis: Unsteadiness on feet (R26.81);History of falling (Z91.81)    Time: 6387-5643 PT Time Calculation (min) (ACUTE ONLY): 20 min   Charges:   PT Evaluation $PT Eval Moderate Complexity: [place], [place], DPT Acute Rehabilitation Services Pager [phone] Office [phone]   Willy Eddy 07/11/2018, 1:19 PM

## 2018-07-11 NOTE — NC FL2 (Signed)
[place] LEVEL OF CARE SCREENING TOOL     IDENTIFICATION  Patient Name: Emily Waller Birthdate: [birth year] Sex: female Admission Date (Current Location): 07/07/2018  [facility] and [place] Number:  Herbalist and Address:  The . [facility], [place] [address], [place], [place] [address]      Provider Number:    Attending Physician Name and Address:  Mendel Corning, MD  Relative Name and Phone Number:  Manuella Ghazi [phone]    Current Level of Care: Hospital Recommended Level of Care: [place] Prior Approval Number:    Date Approved/Denied:   PASRR Number:    Discharge Plan: Other (Comment)(Assisted Living Facility. )    Current Diagnoses: Patient Active Problem List   Diagnosis Date Noted  . Symptomatic bradycardia 07/07/2018  . Renal insufficiency 07/07/2018  . Diet-controlled diabetes mellitus ([place]) 07/07/2018  . Itching 09/12/2015  . Diabetes ([place]) 09/12/2015  . HTN (hypertension) 09/12/2015  . Depression 09/12/2015    Orientation RESPIRATION BLADDER Height & Weight     Self, Situation, Place  Normal Incontinent, External catheter Weight: 141 lb 12.1 oz (64.3 kg) Height:  5\' 8"  (172.7 cm)(per pt)  BEHAVIORAL SYMPTOMS/MOOD NEUROLOGICAL BOWEL NUTRITION STATUS      Continent (Heart diet thin fluids)  AMBULATORY STATUS COMMUNICATION OF NEEDS Skin   Limited Assist   Normal                       Personal Care Assistance Level of Assistance  Bathing, Feeding, Dressing Bathing Assistance: Independent Feeding assistance: Limited assistance Dressing Assistance: Limited assistance     Functional Limitations Info  Sight, Hearing, Speech Sight Info: Adequate Hearing Info: Impaired Speech Info: Adequate    SPECIAL CARE FACTORS FREQUENCY  PT (By licensed PT), OT (By licensed OT)     PT Frequency: n/A OT Frequency: n/A            Contractures Contractures Info: Not present     Additional Factors Info  Code Status(DNR) Code Status Info: DNR             Current Medications (07/11/2018):  This is the current hospital active medication list Current Facility-Administered Medications  Medication Dose Route Frequency Provider Last Rate Last Dose  . acetaminophen (TYLENOL) tablet 650 mg  650 mg Oral Q6H PRN Opyd, Ilene Qua, MD       Or  . acetaminophen (TYLENOL) suppository 650 mg  650 mg Rectal Q6H PRN Opyd, Ilene Qua, MD      . allopurinol (ZYLOPRIM) tablet 300 mg  300 mg Oral Daily Opyd, Ilene Qua, MD   300 mg at 07/11/18 0949  . atorvastatin (LIPITOR) tablet 10 mg  10 mg Oral q1800 Opyd, Ilene Qua, MD   10 mg at 07/10/18 1755  . atropine 1 MG/10ML injection 0.5 mg  0.5 mg Intravenous PRN Opyd, Ilene Qua, MD      . cephALEXin (KEFLEX) capsule 500 mg  500 mg Oral Q12H [place] Shi, MD   500 mg at 07/11/18 0949  . insulin aspart (novoLOG) injection 0-5 Units  0-5 Units Subcutaneous QHS Opyd, Timothy S, MD      . insulin aspart (novoLOG) injection 0-9 Units  0-9 Units Subcutaneous TID WC Opyd, Ilene Qua, MD   2 Units at 07/10/18 1233  . ondansetron (ZOFRAN) tablet 4 mg  4 mg Oral Q6H PRN Opyd, Ilene Qua, MD       Or  . ondansetron [facility])  injection 4 mg  4 mg Intravenous Q6H PRN Opyd, Ilene Qua, MD      . pantoprazole (PROTONIX) EC tablet 40 mg  40 mg Oral Daily Opyd, Ilene Qua, MD   40 mg at 07/11/18 0949  . sertraline (ZOLOFT) tablet 50 mg  50 mg Oral Daily [place] Shi, MD   50 mg at 07/11/18 0949  . sodium chloride flush (NS) 0.9 % injection 3 mL  3 mL Intravenous Q12H Opyd, Ilene Qua, MD   3 mL at 07/11/18 9795     Discharge Medications: Please see discharge summary for a list of discharge medications.  Relevant Imaging Results:  Relevant Lab Results:   Additional Information [id number]  [facility], LCSW

## 2018-07-11 NOTE — Discharge Summary (Signed)
Physician Discharge Summary   Patient ID: Emily Waller MRN: [id number] DOB/AGE: [id number] 82 y.o.  Admit date: 07/07/2018 Discharge date: 07/11/2018  Primary Care Physician:  Orpah Melter, MD   Recommendations for Outpatient Follow-up:  1. Follow up with PCP in 1-2 weeks 2. Fall precautions   Home Health: discharging to SNF Equipment/Devices:   Discharge Condition: stable CODE STATUS:  DNR   Diet recommendation: carb mod   Discharge Diagnoses:    . Symptomatic junctional bradycardia with hypotension  Acute metabolic encephalopathy secondary to UTI, superimposed on dementia  . Depression . HTN (hypertension) UTI  Acute on chronic CKD stage II Diet controlled diabetes mellitus, type 2  Consults:  EP cardiology    Allergies:   Allergies  Allergen Reactions  . Colcrys [Colchicine] Nausea Only    Stomach upset   . Lovastatin Other (See Comments)    myalgia     DISCHARGE MEDICATIONS: Allergies as of 07/11/2018      Reactions   Colcrys [colchicine] Nausea Only   Stomach upset   Lovastatin Other (See Comments)   myalgia      Medication List    STOP taking these medications   losartan-hydrochlorothiazide 50-12.5 MG tablet Commonly known as:  HYZAAR     TAKE these medications   allopurinol 300 MG tablet Commonly known as:  ZYLOPRIM Take 300 mg by mouth daily.   amLODipine 5 MG tablet Commonly known as:  NORVASC Take 5 mg by mouth daily.   atorvastatin 10 MG tablet Commonly known as:  LIPITOR Take 10 mg by mouth daily.   cephALEXin 500 MG capsule Commonly known as:  KEFLEX Take 1 capsule (500 mg total) by mouth 2 (two) times daily for 5 days.   ergocalciferol 1.25 MG (50000 UT) capsule Commonly known as:  VITAMIN D2 Take 50,000 Units by mouth every Tuesday.   ezetimibe 10 MG tablet Commonly known as:  ZETIA Take 10 mg by mouth daily.   KRILL OIL PO Take 1 capsule by mouth daily.   oxybutynin 10 MG 24 hr tablet Commonly known  as:  DITROPAN-XL Take 10 mg by mouth daily.   pantoprazole 40 MG tablet Commonly known as:  PROTONIX Take 40 mg by mouth daily.   sertraline 50 MG tablet Commonly known as:  ZOLOFT Take 1 tablet (50 mg total) by mouth daily. What changed:    medication strength  how much to take   Turmeric 500 MG Caps Take 500 mg by mouth daily.        Brief H and P: For complete details please refer to admission H and P, but in brief 84 y.o.femalewith medical history significant fordementia, hypertension, diet-controlled diabetes mellitus, and renal insufficiency, presented to the emergency department on 12/18 from ALF with AMS/weakness/dizziness and for evaluation of low heart rate (30s)and low blood pressure(SBP 80s). Upon arrival to the ED, patient is found to be bradycardic in the 48N, systolic blood pressure in the mid to upper 90s, and normal O2 saturations. EKG features a junctional rhythm with rate 42 and nonspecific IVCD. Chest x-ray is negative for acute cardiopulmonary disease. Chemistry panel is notable for BUN of 31, glucose 200, and creatinine 2.02, up from 1.2 remotely. CBC features a leukocytosis to 11,900 and troponin normal. Patient was treated with 500 ml NS enroute by EMS and 0.5 mg of atropine in the ED. Cardiology was consulted. Patient not on AV blockers at ALF and family in the ED felt her cognition was at baseline.   Hospital  Course:  Symptomatic junctional bradycardia with hypotension, POA -Not on AV blockers at baseline -HCTZ, lisinopril on hold -Seen by EP, per Dr. Caryl Comes, unclear what triggered the bradycardia.  If patient were to have recurrent episodes of weakness with documented sinus node dysfunction, pacemaker would be reasonable, at these point hold off.  - 2D echo showed EF 10%, grade 2 diastolic dysfunction, moderate MR - fall precautions  - TSH 2.3  Acute metabolic encephalopathy secondary to UTI, superimposed on dementia  -patiebt was placed on IV  Rocephin -Urine culture showed Klebsiella pneumonia, transitioned to oral keflex for 5 more days to complete course  HTN (hypertension) -Antihypertensives were initially placed on hold, patient can resume norvasc Continue to hold losartan-HCTZ    Depression -Patient was on Zoloft 100 mg, now reduced to 50 mg daily, given age, renal function and bradycardia   Acute on CKD stage II -Unclear baseline, creatinine improving off diuretics/ACE inhibitor -2.1 on admission, Creatinine now improved, 1.1 at discharge     Diet-controlled diabetes mellitus ([place]) -CBGs controlled   Day of Discharge S: no complaints, no symptomatic bradycardia overnight   BP (!) 139/53 (BP Location: Left Arm)   Pulse 60   Temp 97.8 F (36.6 C) (Oral)   Resp 18   Ht 5\' 8"  (1.727 m) Comment: per pt  Wt 64.3 kg   SpO2 98%   BMI 21.55 kg/m   Physical Exam: General: Alert and awake oriented x 2, dementia, not in any acute distress. HEENT: anicteric sclera, pupils reactive to light and accommodation CVS: S1-S2 clear no murmur rubs or gallops Chest: clear to auscultation bilaterally, no wheezing rales or rhonchi Abdomen: soft nontender, nondistended, normal bowel sounds Extremities: no cyanosis, clubbing or edema noted bilaterally Neuro: Cranial nerves II-XII intact, no focal neurological deficits   The results of significant diagnostics from this hospitalization (including imaging, microbiology, ancillary and laboratory) are listed below for reference.      Procedures/Studies:  2d echo Study Conclusions  - Left ventricle: The cavity size was normal. Wall thickness was   normal. Systolic function was normal. The estimated ejection   fraction was in the range of 60% to 65%. Wall motion was normal;   there were no regional wall motion abnormalities. Features are   consistent with a pseudonormal left ventricular filling pattern,   with concomitant abnormal relaxation and increased filling    pressure (grade 2 diastolic dysfunction). Doppler parameters are   consistent with high ventricular filling pressure. - Aortic valve: Sclerosis without stenosis. There was mild to   moderate regurgitation. - Mitral valve: Calcified annulus. Transvalvular velocity was   within the normal range. There was no evidence for stenosis.   There was trivial regurgitation. Mean gradient (D): 2 mm Hg (HR   56 BPM). - Left atrium: The atrium was severely dilated. - Right ventricle: The cavity size was normal. Wall thickness was   normal. Systolic function was normal. RV systolic pressure (S,   est): 55 mm Hg. - Right atrium: The atrium was moderately dilated. Central venous   pressure (est): 10 mm Hg. - Tricuspid valve: There was moderate regurgitation. - Pulmonary arteries: Systolic pressure was severely increased.  Dg Chest Portable 1 View  Result Date: 07/07/2018 CLINICAL DATA:  Hypotensive, bradycardia. History of hypertension hyperlipidemia. EXAM: PORTABLE CHEST 1 VIEW COMPARISON:  None available for comparison at time of study interpretation. FINDINGS: Cardiomediastinal silhouette is normal. Calcified aortic arch. No pleural effusions or focal consolidations. Trachea projects midline and there is no  pneumothorax. Soft tissue planes and included osseous structures are non-suspicious. Chronic deformity LEFT humeral head. IMPRESSION: 1. No acute cardiopulmonary process. 2.  Aortic Atherosclerosis (ICD10-I70.0). Electronically Signed   By: Elon Alas M.D.   On: 07/07/2018 19:22      LAB RESULTS: Basic Metabolic Panel: Recent Labs  Lab 07/08/18 0450 07/10/18 0916  NA 141 139  K 3.9 4.2  CL 107 104  CO2 20* 25  GLUCOSE 96 122*  BUN 35* 20  CREATININE 1.82* 1.10*  CALCIUM 8.7* 9.1  MG 1.5*  --    Liver Function Tests: Recent Labs  Lab 07/07/18 1815  AST 18  ALT 18  ALKPHOS 57  BILITOT 0.5  PROT 5.9*  ALBUMIN 3.5   No results for input(s): LIPASE, AMYLASE in the last 168  hours. No results for input(s): AMMONIA in the last 168 hours. CBC: Recent Labs  Lab 07/08/18 0354 07/10/18 0916  WBC 9.6 10.3  NEUTROABS 6.5  --   HGB 11.4* 12.2  HCT 35.1* 37.1  MCV 94.9 94.2  PLT 122* 123*   Cardiac Enzymes: No results for input(s): CKTOTAL, CKMB, CKMBINDEX, TROPONINI in the last 168 hours. BNP: Invalid input(s): POCBNP CBG: Recent Labs  Lab 07/10/18 2117 07/11/18 0835  GLUCAP 155* 99      Disposition and Follow-up: Discharge Instructions    Diet Carb Modified   Complete by:  As directed    Increase activity slowly   Complete by:  As directed        DISPOSITION:  SNF   DISCHARGE FOLLOW-UP Follow-up Information    Orpah Melter, MD. Schedule an appointment as soon as possible for a visit in 2 week(s).   Specialty:  Family Medicine Contact information: [place]  [address] [phone]            Time coordinating discharge:  50mins   Signed:   Estill Cotta M.D. [facility] 07/11/2018, 12:29 PM Pager: [phone]

## 2018-07-11 NOTE — Progress Notes (Signed)
Clinical Social Worker facilitated patient discharge including contacting patient family and facility to confirm patient discharge plans.  Clinical information faxed to facility and family agreeable with plan.  CSW arranged ambulance transport via [facility] to [facility].  RN to call for report prior to discharge [phone]).  Clinical Social Worker will sign off for now as social work intervention is no longer needed. Please consult [place] again if new need arises.  Mahamad Durante [place] LCSW [phone]

## 2018-07-12 NOTE — Consult Note (Signed)
            [facility] CM Primary Care Navigator  07/12/2018  Emily Waller [birth year] [id number]   Attemptto seepatient at the bedside to identify possible discharge needs butshewasalready discharged over the weekend.  Patient resides in an assisted living facility (history of dementia). She was discharged back to [name] with home health services per therapy recommendation.  Per MD note,patientpresented to the emergency department from ALF- assisted living facility with AMS- altered mental status, weakness, dizziness and for evaluation of low heart rate and low blood pressure. (symptomatic junctional bradycardia with hypotension, acute encephalopathy secondary to UTI)   Primary care provider's office is listed as providing transition of care (TOC) follow-up.  Patient has discharge instruction to follow-up with primary care provider in 1- 2 weeks.   For additional questions please contact:  Edwena Felty A. Mikale Silversmith, BSN, RN-BC [facility] PRIMARY CARE Navigator Cell: [phone]

## 2018-07-30 DIAGNOSIS — Z0000 Encounter for general adult medical examination without abnormal findings: Secondary | ICD-10-CM | POA: Diagnosis not present

## 2018-08-04 DIAGNOSIS — I739 Peripheral vascular disease, unspecified: Secondary | ICD-10-CM | POA: Diagnosis not present

## 2018-08-04 DIAGNOSIS — B351 Tinea unguium: Secondary | ICD-10-CM | POA: Diagnosis not present

## 2018-08-04 DIAGNOSIS — Q845 Enlarged and hypertrophic nails: Secondary | ICD-10-CM | POA: Diagnosis not present

## 2018-08-11 DIAGNOSIS — G301 Alzheimer's disease with late onset: Secondary | ICD-10-CM | POA: Diagnosis not present

## 2018-08-11 DIAGNOSIS — F0280 Dementia in other diseases classified elsewhere without behavioral disturbance: Secondary | ICD-10-CM | POA: Diagnosis not present

## 2018-08-11 DIAGNOSIS — F331 Major depressive disorder, recurrent, moderate: Secondary | ICD-10-CM | POA: Diagnosis not present

## 2018-09-03 DIAGNOSIS — M1000 Idiopathic gout, unspecified site: Secondary | ICD-10-CM | POA: Diagnosis not present

## 2018-09-03 DIAGNOSIS — E559 Vitamin D deficiency, unspecified: Secondary | ICD-10-CM | POA: Diagnosis not present

## 2018-09-03 DIAGNOSIS — R269 Unspecified abnormalities of gait and mobility: Secondary | ICD-10-CM | POA: Diagnosis not present

## 2018-09-03 DIAGNOSIS — I10 Essential (primary) hypertension: Secondary | ICD-10-CM | POA: Diagnosis not present

## 2018-09-03 DIAGNOSIS — E039 Hypothyroidism, unspecified: Secondary | ICD-10-CM | POA: Diagnosis not present

## 2018-09-03 DIAGNOSIS — N39498 Other specified urinary incontinence: Secondary | ICD-10-CM | POA: Diagnosis not present

## 2018-09-03 DIAGNOSIS — E782 Mixed hyperlipidemia: Secondary | ICD-10-CM | POA: Diagnosis not present

## 2018-09-03 DIAGNOSIS — N390 Urinary tract infection, site not specified: Secondary | ICD-10-CM | POA: Diagnosis not present

## 2018-09-03 DIAGNOSIS — F339 Major depressive disorder, recurrent, unspecified: Secondary | ICD-10-CM | POA: Diagnosis not present

## 2018-09-03 DIAGNOSIS — K219 Gastro-esophageal reflux disease without esophagitis: Secondary | ICD-10-CM | POA: Diagnosis not present

## 2018-09-03 DIAGNOSIS — R296 Repeated falls: Secondary | ICD-10-CM | POA: Diagnosis not present

## 2018-09-07 DIAGNOSIS — F331 Major depressive disorder, recurrent, moderate: Secondary | ICD-10-CM | POA: Diagnosis not present

## 2018-09-07 DIAGNOSIS — F0280 Dementia in other diseases classified elsewhere without behavioral disturbance: Secondary | ICD-10-CM | POA: Diagnosis not present

## 2018-09-07 DIAGNOSIS — G301 Alzheimer's disease with late onset: Secondary | ICD-10-CM | POA: Diagnosis not present

## 2018-09-09 DIAGNOSIS — I10 Essential (primary) hypertension: Secondary | ICD-10-CM | POA: Diagnosis not present

## 2018-09-09 DIAGNOSIS — K219 Gastro-esophageal reflux disease without esophagitis: Secondary | ICD-10-CM | POA: Diagnosis not present

## 2018-09-09 DIAGNOSIS — E782 Mixed hyperlipidemia: Secondary | ICD-10-CM | POA: Diagnosis not present

## 2018-09-09 DIAGNOSIS — E039 Hypothyroidism, unspecified: Secondary | ICD-10-CM | POA: Diagnosis not present

## 2018-09-09 DIAGNOSIS — E559 Vitamin D deficiency, unspecified: Secondary | ICD-10-CM | POA: Diagnosis not present

## 2018-09-09 DIAGNOSIS — R296 Repeated falls: Secondary | ICD-10-CM | POA: Diagnosis not present

## 2018-09-09 DIAGNOSIS — N390 Urinary tract infection, site not specified: Secondary | ICD-10-CM | POA: Diagnosis not present

## 2018-09-09 DIAGNOSIS — F339 Major depressive disorder, recurrent, unspecified: Secondary | ICD-10-CM | POA: Diagnosis not present

## 2018-09-09 DIAGNOSIS — N39498 Other specified urinary incontinence: Secondary | ICD-10-CM | POA: Diagnosis not present

## 2018-09-09 DIAGNOSIS — R269 Unspecified abnormalities of gait and mobility: Secondary | ICD-10-CM | POA: Diagnosis not present

## 2018-09-09 DIAGNOSIS — M1000 Idiopathic gout, unspecified site: Secondary | ICD-10-CM | POA: Diagnosis not present

## 2018-09-10 DIAGNOSIS — R300 Dysuria: Secondary | ICD-10-CM | POA: Diagnosis not present

## 2018-09-24 DIAGNOSIS — E782 Mixed hyperlipidemia: Secondary | ICD-10-CM | POA: Diagnosis not present

## 2018-09-24 DIAGNOSIS — E039 Hypothyroidism, unspecified: Secondary | ICD-10-CM | POA: Diagnosis not present

## 2018-09-24 DIAGNOSIS — I10 Essential (primary) hypertension: Secondary | ICD-10-CM | POA: Diagnosis not present

## 2018-09-27 DIAGNOSIS — R05 Cough: Secondary | ICD-10-CM | POA: Diagnosis not present

## 2018-09-27 DIAGNOSIS — R0602 Shortness of breath: Secondary | ICD-10-CM | POA: Diagnosis not present

## 2018-10-01 ENCOUNTER — Other Ambulatory Visit: Payer: Self-pay

## 2018-10-01 ENCOUNTER — Encounter (HOSPITAL_COMMUNITY): Payer: Self-pay | Admitting: Emergency Medicine

## 2018-10-01 ENCOUNTER — Emergency Department (HOSPITAL_COMMUNITY): Payer: Medicare Other

## 2018-10-01 ENCOUNTER — Emergency Department (HOSPITAL_COMMUNITY)
Admission: EM | Admit: 2018-10-01 | Discharge: 2018-10-02 | Disposition: A | Discharge status: Home / Self Care | Payer: Medicare Other | Attending: Emergency Medicine | Admitting: Emergency Medicine

## 2018-10-01 DIAGNOSIS — Z79899 Other long term (current) drug therapy: Secondary | ICD-10-CM | POA: Diagnosis not present

## 2018-10-01 DIAGNOSIS — R509 Fever, unspecified: Secondary | ICD-10-CM | POA: Diagnosis not present

## 2018-10-01 DIAGNOSIS — J189 Pneumonia, unspecified organism: Secondary | ICD-10-CM | POA: Diagnosis not present

## 2018-10-01 DIAGNOSIS — E119 Type 2 diabetes mellitus without complications: Secondary | ICD-10-CM | POA: Diagnosis not present

## 2018-10-01 DIAGNOSIS — I10 Essential (primary) hypertension: Secondary | ICD-10-CM | POA: Insufficient documentation

## 2018-10-01 DIAGNOSIS — R531 Weakness: Secondary | ICD-10-CM | POA: Diagnosis not present

## 2018-10-01 DIAGNOSIS — Z87891 Personal history of nicotine dependence: Secondary | ICD-10-CM | POA: Insufficient documentation

## 2018-10-01 DIAGNOSIS — R05 Cough: Secondary | ICD-10-CM | POA: Diagnosis not present

## 2018-10-01 DIAGNOSIS — R0602 Shortness of breath: Secondary | ICD-10-CM | POA: Insufficient documentation

## 2018-10-01 DIAGNOSIS — J40 Bronchitis, not specified as acute or chronic: Secondary | ICD-10-CM | POA: Diagnosis not present

## 2018-10-01 NOTE — ED Notes (Signed)
Bed: WA02 Expected date:  Expected time:  Means of arrival:  Comments: 33F pneumonia

## 2018-10-01 NOTE — ED Triage Notes (Signed)
Pt comes from nursing home, dx with pneumonia on Monday placed on antibiotics, but cough seems to be getting worst and pt was slower respond to staff today, so want evaluation. Pt is hard of hearing, and alert x4.

## 2018-10-01 NOTE — ED Provider Notes (Signed)
[facility] DEPT Provider Note   CSN: [id number] Arrival date & time: 10/01/18  2152    History   Chief Complaint Chief Complaint  Patient presents with  . Cough    HPI Emily Waller is a 85 y.o. female.     The history is provided by the patient.     85 yo F with h/o HTN, HLD, DM, here with cough, fatigue. Pt reports she has felt "unwell" x 2 weeks, along with cough and poor appetite.  The patient reports that she has had generalized weakness and a cough that she cannot get rid of.  Per report from the facility and EMS, she was diagnosed with flu and pneumonia and is currently on Levaquin per [facility].  She states that she feels fatigued.  She has a predilection for bronchitis and respiratory infections per her report, but has never been formally diagnosed with pneumonia.  She does not use inhalers.  She has no history of travel outside [place].  No known coronavirus exposures at her current facility.  She is hard of hearing but has no history of dementia.  No other complaints.  She has not tried anything for her cough.  She is been on Levaquin for several days.  She denies known fevers but has had some chills.  Past Medical History:  Diagnosis Date  . Anxiety   . Depression   . Diabetes mellitus without complication ([place])   . Hyperlipidemia   . Hypertension   . Renal disorder    Kidney disease  . Vitamin D deficiency     Patient Active Problem List   Diagnosis Date Noted  . Symptomatic bradycardia 07/07/2018  . Renal insufficiency 07/07/2018  . Diet-controlled diabetes mellitus ([place]) 07/07/2018  . Itching 09/12/2015  . Diabetes ([place]) 09/12/2015  . HTN (hypertension) 09/12/2015  . Depression 09/12/2015    History reviewed. No pertinent surgical history.   OB History   No obstetric history on file.      Home Medications    Prior to Admission medications   Medication Sig Start Date End Date Taking? Authorizing Provider   allopurinol (ZYLOPRIM) 300 MG tablet Take 300 mg by mouth daily.   Yes [provider]  amLODipine (NORVASC) 5 MG tablet Take 5 mg by mouth daily.   Yes [provider]  atorvastatin (LIPITOR) 10 MG tablet Take 10 mg by mouth daily.   Yes [provider]  ergocalciferol (VITAMIN D2) 1.25 MG (50000 UT) capsule Take 50,000 Units by mouth every Tuesday.   Yes [provider]  ezetimibe (ZETIA) 10 MG tablet Take 10 mg by mouth daily.   Yes [provider]  KRILL OIL PO Take 1 capsule by mouth daily.   Yes [provider]  levofloxacin (LEVAQUIN) 500 MG tablet Take 500 mg by mouth daily. Started on 03/11 for 7 days   Yes [provider]  oxybutynin (DITROPAN-XL) 10 MG 24 hr tablet Take 10 mg by mouth daily.   Yes [provider]  pantoprazole (PROTONIX) 40 MG tablet Take 40 mg by mouth daily.   Yes [provider]  sertraline (ZOLOFT) 50 MG tablet Take 1 tablet (50 mg total) by mouth daily. 07/11/18  Yes Rai, Ripudeep K, MD  Turmeric 500 MG CAPS Take 500 mg by mouth daily.   Yes [provider]  predniSONE (DELTASONE) 20 MG tablet Take 2 tablets (40 mg total) by mouth daily for 4 days. 10/02/18 10/06/18  Duffy Bruce,  MD    Family History Family History  Problem Relation Age of Onset  . Hyperlipidemia Mother   . Hypertension Mother   . Cancer Father   . Heart disease Father   . CAD Maternal Grandmother   . CAD Maternal Grandfather   . Lung cancer Sister     Social History Social History   Tobacco Use  . Smoking status: Former Smoker    Last attempt to quit: 10/02/1969    Years since quitting: 49.0  . Smokeless tobacco: Never Used  Substance Use Topics  . Alcohol use: No    Alcohol/week: 0.0 standard drinks    Frequency: Never  . Drug use: No     Allergies   Colcrys [colchicine] and Lovastatin   Review of Systems Review of Systems  Constitutional: Positive for chills and fatigue.  Negative for fever.  HENT: Negative for congestion and rhinorrhea.   Eyes: Negative for visual disturbance.  Respiratory: Positive for cough and shortness of breath. Negative for wheezing.   Cardiovascular: Negative for chest pain and leg swelling.  Gastrointestinal: Positive for nausea. Negative for abdominal pain, diarrhea and vomiting.  Genitourinary: Negative for dysuria and flank pain.  Musculoskeletal: Negative for neck pain and neck stiffness.  Skin: Negative for rash and wound.  Allergic/Immunologic: Negative for immunocompromised state.  Neurological: Positive for weakness. Negative for syncope and headaches.  All other systems reviewed and are negative.    Physical Exam Updated Vital Signs BP (!) 162/47   Pulse (!) 54   Temp 98.2 F (36.8 C) (Oral)   Resp 20   SpO2 100%   Physical Exam Vitals signs and nursing note reviewed.  Constitutional:      General: She is not in acute distress.    Appearance: She is well-developed.  HENT:     Head: Normocephalic and atraumatic.     Comments: Moderately dry MM. No posterior pharyngeal swelling. Uvula midline.  Eyes:     Conjunctiva/sclera: Conjunctivae normal.  Neck:     Musculoskeletal: Neck supple.  Cardiovascular:     Rate and Rhythm: Normal rate and regular rhythm.     Heart sounds: Normal heart sounds. No murmur. No friction rub.  Pulmonary:     Effort: Pulmonary effort is normal. No respiratory distress.     Breath sounds: Examination of the right-lower field reveals rhonchi. Examination of the left-lower field reveals rhonchi. Rhonchi present. No wheezing or rales.     Comments: Frequent harsh cough Abdominal:     General: There is no distension.     Palpations: Abdomen is soft.     Tenderness: There is no abdominal tenderness.  Musculoskeletal:     Right lower leg: Edema (trace) present.     Left lower leg: Edema (trace) present.  Skin:    General: Skin is warm.     Capillary Refill: Capillary refill takes  less than 2 seconds.  Neurological:     Mental Status: She is alert and oriented to person, place, and time.     Motor: No abnormal muscle tone.      ED Treatments / Results  Labs (all labs ordered are listed, but only abnormal results are displayed) Labs Reviewed  COMPREHENSIVE METABOLIC PANEL - Abnormal; Notable for the following components:      Result Value   BUN 34 (*)    Creatinine, Ser 1.22 (*)    Calcium 8.7 (*)    Total Protein 6.4 (*)    GFR calc non Af Amer 40 (*)  GFR calc Af Amer 46 (*)    All other components within normal limits  CBC - Abnormal; Notable for the following components:   Platelets 135 (*)    All other components within normal limits  CULTURE, BLOOD (ROUTINE X 2)  CULTURE, BLOOD (ROUTINE X 2)  LACTIC ACID, PLASMA  LACTIC ACID, PLASMA  DIFFERENTIAL  BLOOD GAS, VENOUS  CBC WITH DIFFERENTIAL/PLATELET    EKG None Normal sinus rhythm, no ST elevation or depression.  Radiology Dg Chest 2 View  Result Date: 10/01/2018 CLINICAL DATA:  Worsening cough after beginning antibiotics on Monday for pneumonia. EXAM: CHEST - 2 VIEW COMPARISON:  07/07/2018 FINDINGS: Normal heart size and pulmonary vascularity. Calcification of the mitral valve annulus. Lungs are clear and expanded. Mild hyperinflation likely emphysematous. No blunting of costophrenic angles. No pneumothorax. Mediastinal contours appear intact. Calcification of the aorta. Old healed fracture deformity of the left proximal humerus. IMPRESSION: Emphysematous changes in the lungs. No evidence of active pulmonary disease. Electronically Signed   By: Lucienne Capers M.D.   On: 10/01/2018 23:31    Procedures Procedures (including critical care time)  Medications Ordered in ED Medications  albuterol (PROVENTIL HFA;VENTOLIN HFA) 108 (90 Base) MCG/ACT inhaler 2 puff (has no administration in time range)  predniSONE (DELTASONE) tablet 20 mg (has no administration in time range)   ipratropium-albuterol (DUONEB) 0.5-2.5 (3) MG/3ML nebulizer solution 3 mL (3 mLs Nebulization Given 10/02/18 0327)     Initial Impression / Assessment and Plan / ED Course  I have reviewed the triage vital signs and the nursing notes.  Pertinent labs & imaging results that were available during my care of the patient were reviewed by me and considered in my medical decision making (see chart for details).        85 year old female here with evaluation for possible pneumonia.  Patient is currently on Levaquin after recent diagnosis of pneumonia as well as flu.  She was slightly more tired than usual today so the nurse that checks on her decided to send her to the ED.  On arrival here, the patient is remarkably well-appearing for age.  She is speaking in full sentences.  Chest x-ray shows no pneumonia.  Patient does have a reported history of recurrent bronchitis and what I suspect is some underlying reactive airway disease.  She has mild wheezing on exam.She was given she was given a breathing treatment with excellent effect.  She is ambulatory throughout the ED without difficulty.  Will give her low-dose prednisone in addition to the Levaquin she is already on, continue supportive care and encourage fluids at home.  Will also give her albuterol at her facility, for possible reactive component.  Given that she is satting well on room air in no distress with reassuring labs including normal lactic acid and white count, I think is reasonable to continue outpatient treatment which the patient prefers.  Final Clinical Impressions(s) / ED Diagnoses   Final diagnoses:  Bronchitis    ED Discharge Orders         Ordered    predniSONE (DELTASONE) 20 MG tablet  Daily,   Status:  Discontinued     10/02/18 0447    predniSONE (DELTASONE) 20 MG tablet  Daily     10/02/18 0447           Duffy Bruce, MD 10/02/18 [phone]

## 2018-10-01 NOTE — ED Triage Notes (Signed)
Pt tested flu post ive on Monday ems states

## 2018-10-02 DIAGNOSIS — J40 Bronchitis, not specified as acute or chronic: Secondary | ICD-10-CM | POA: Diagnosis not present

## 2018-10-02 DIAGNOSIS — I10 Essential (primary) hypertension: Secondary | ICD-10-CM | POA: Diagnosis not present

## 2018-10-02 DIAGNOSIS — I959 Hypotension, unspecified: Secondary | ICD-10-CM | POA: Diagnosis not present

## 2018-10-02 DIAGNOSIS — R279 Unspecified lack of coordination: Secondary | ICD-10-CM | POA: Diagnosis not present

## 2018-10-02 DIAGNOSIS — I499 Cardiac arrhythmia, unspecified: Secondary | ICD-10-CM | POA: Diagnosis not present

## 2018-10-02 DIAGNOSIS — Z743 Need for continuous supervision: Secondary | ICD-10-CM | POA: Diagnosis not present

## 2018-10-02 LAB — CBC
HCT: 38.4 % (ref 36.0–46.0)
Hemoglobin: 12.3 g/dL (ref 12.0–15.0)
MCH: 31.5 pg (ref 26.0–34.0)
MCHC: 32 g/dL (ref 30.0–36.0)
MCV: 98.2 fL (ref 80.0–100.0)
Platelets: 135 10*3/uL — ABNORMAL LOW (ref 150–400)
RBC: 3.91 MIL/uL (ref 3.87–5.11)
RDW: 14.1 % (ref 11.5–15.5)
WBC: 7.7 10*3/uL (ref 4.0–10.5)
nRBC: 0 % (ref 0.0–0.2)

## 2018-10-02 LAB — DIFFERENTIAL
Abs Immature Granulocytes: 0.02 10*3/uL (ref 0.00–0.07)
Basophils Absolute: 0.1 10*3/uL (ref 0.0–0.1)
Basophils Relative: 1 %
Eosinophils Absolute: 0 10*3/uL (ref 0.0–0.5)
Eosinophils Relative: 0 %
Immature Granulocytes: 0 %
Lymphocytes Relative: 31 %
Lymphs Abs: 2.4 10*3/uL (ref 0.7–4.0)
Monocytes Absolute: 0.6 10*3/uL (ref 0.1–1.0)
Monocytes Relative: 8 %
Neutro Abs: 4.6 10*3/uL (ref 1.7–7.7)
Neutrophils Relative %: 60 %

## 2018-10-02 LAB — LACTIC ACID, PLASMA
Lactic Acid, Venous: 1.2 mmol/L (ref 0.5–1.9)
Lactic Acid, Venous: 1 mmol/L (ref 0.5–1.9)

## 2018-10-02 LAB — COMPREHENSIVE METABOLIC PANEL
ALT: 23 U/L (ref 0–44)
AST: 25 U/L (ref 15–41)
Albumin: 3.8 g/dL (ref 3.5–5.0)
Alkaline Phosphatase: 74 U/L (ref 38–126)
Anion gap: 7 (ref 5–15)
BILIRUBIN TOTAL: 0.5 mg/dL (ref 0.3–1.2)
BUN: 34 mg/dL — ABNORMAL HIGH (ref 8–23)
CO2: 24 mmol/L (ref 22–32)
Calcium: 8.7 mg/dL — ABNORMAL LOW (ref 8.9–10.3)
Chloride: 110 mmol/L (ref 98–111)
Creatinine, Ser: 1.22 mg/dL — ABNORMAL HIGH (ref 0.44–1.00)
GFR calc Af Amer: 46 mL/min — ABNORMAL LOW (ref >60)
GFR calc non Af Amer: 40 mL/min — ABNORMAL LOW (ref >60)
Glucose, Bld: 95 mg/dL (ref 70–99)
Potassium: 3.8 mmol/L (ref 3.5–5.1)
Sodium: 141 mmol/L (ref 135–145)
TOTAL PROTEIN: 6.4 g/dL — AB (ref 6.5–8.1)

## 2018-10-02 LAB — BLOOD GAS, VENOUS
ACID-BASE EXCESS: 0.7 mmol/L (ref 0.0–2.0)
Bicarbonate: 25.9 mmol/L (ref 20.0–28.0)
FIO2: 21
O2 Saturation: 54.3 %
Patient temperature: 98.6
pCO2, Ven: 46.6 mmHg (ref 44.0–60.0)
pH, Ven: 7.363 (ref 7.250–7.430)

## 2018-10-02 MED ORDER — ALBUTEROL SULFATE HFA 108 (90 BASE) MCG/ACT IN AERS
2.0000 | INHALATION_SPRAY | Freq: Once | RESPIRATORY_TRACT | Status: AC
  Administered 2018-10-02: 2 via RESPIRATORY_TRACT
  Filled 2018-10-02: qty 6.7

## 2018-10-02 MED ORDER — IPRATROPIUM-ALBUTEROL 0.5-2.5 (3) MG/3ML IN SOLN
3.0000 mL | Freq: Once | RESPIRATORY_TRACT | Status: AC
  Administered 2018-10-02: 3 mL via RESPIRATORY_TRACT
  Filled 2018-10-02: qty 3

## 2018-10-02 MED ORDER — PREDNISONE 20 MG PO TABS: 20.0000 mg | ORAL_TABLET | Freq: Every day | ORAL | 0 refills | Status: DC

## 2018-10-02 MED ORDER — PREDNISONE 20 MG PO TABS
20.0000 mg | ORAL_TABLET | Freq: Once | ORAL | Status: AC
  Administered 2018-10-02: 20 mg via ORAL
  Filled 2018-10-02: qty 1

## 2018-10-02 MED ORDER — PREDNISONE 20 MG PO TABS: 40.0000 mg | ORAL_TABLET | Freq: Every day | ORAL | 0 refills | Status: AC

## 2018-10-02 NOTE — Discharge Instructions (Addendum)
I suspect you have a component of wheezing/bronchospasm in addition to a possible pneumonia  CONTINUE YOUR LEVAQUIN ANTIBIOTIC  START THE PREDNISONE TODAY  FOR YOUR COUGH/SOB/WHEEZING, USE THE ALBUTEROL INHALER EVERY 4 HOURS FOR 24 hours, then every 4-6 hours as needed

## 2018-10-02 NOTE — ED Notes (Signed)
Walked in hallway w [facility]

## 2018-10-02 NOTE — ED Notes (Signed)
Called BL @ [facility]  Paper work complete  Contractor

## 2018-10-06 DIAGNOSIS — G301 Alzheimer's disease with late onset: Secondary | ICD-10-CM | POA: Diagnosis not present

## 2018-10-06 DIAGNOSIS — F331 Major depressive disorder, recurrent, moderate: Secondary | ICD-10-CM | POA: Diagnosis not present

## 2018-10-06 DIAGNOSIS — F0280 Dementia in other diseases classified elsewhere without behavioral disturbance: Secondary | ICD-10-CM | POA: Diagnosis not present

## 2018-10-07 LAB — CULTURE, BLOOD (ROUTINE X 2)
Culture: NO GROWTH
Culture: NO GROWTH
Special Requests: ADEQUATE
Special Requests: ADEQUATE

## 2018-10-08 DIAGNOSIS — F339 Major depressive disorder, recurrent, unspecified: Secondary | ICD-10-CM | POA: Diagnosis not present

## 2018-10-08 DIAGNOSIS — R296 Repeated falls: Secondary | ICD-10-CM | POA: Diagnosis not present

## 2018-10-08 DIAGNOSIS — J209 Acute bronchitis, unspecified: Secondary | ICD-10-CM | POA: Diagnosis not present

## 2018-10-08 DIAGNOSIS — K219 Gastro-esophageal reflux disease without esophagitis: Secondary | ICD-10-CM | POA: Diagnosis not present

## 2018-10-08 DIAGNOSIS — R269 Unspecified abnormalities of gait and mobility: Secondary | ICD-10-CM | POA: Diagnosis not present

## 2018-10-08 DIAGNOSIS — E782 Mixed hyperlipidemia: Secondary | ICD-10-CM | POA: Diagnosis not present

## 2018-10-08 DIAGNOSIS — E559 Vitamin D deficiency, unspecified: Secondary | ICD-10-CM | POA: Diagnosis not present

## 2018-10-08 DIAGNOSIS — I10 Essential (primary) hypertension: Secondary | ICD-10-CM | POA: Diagnosis not present

## 2018-10-08 DIAGNOSIS — N39498 Other specified urinary incontinence: Secondary | ICD-10-CM | POA: Diagnosis not present

## 2018-10-08 DIAGNOSIS — N390 Urinary tract infection, site not specified: Secondary | ICD-10-CM | POA: Diagnosis not present

## 2018-10-08 DIAGNOSIS — E039 Hypothyroidism, unspecified: Secondary | ICD-10-CM | POA: Diagnosis not present

## 2018-10-08 DIAGNOSIS — M1000 Idiopathic gout, unspecified site: Secondary | ICD-10-CM | POA: Diagnosis not present

## 2018-10-19 DIAGNOSIS — J449 Chronic obstructive pulmonary disease, unspecified: Secondary | ICD-10-CM | POA: Diagnosis not present

## 2018-10-19 DIAGNOSIS — G40309 Generalized idiopathic epilepsy and epileptic syndromes, not intractable, without status epilepticus: Secondary | ICD-10-CM | POA: Diagnosis not present

## 2018-10-22 DIAGNOSIS — J209 Acute bronchitis, unspecified: Secondary | ICD-10-CM | POA: Diagnosis not present

## 2018-10-22 DIAGNOSIS — R05 Cough: Secondary | ICD-10-CM | POA: Diagnosis not present

## 2018-10-24 DIAGNOSIS — R05 Cough: Secondary | ICD-10-CM | POA: Diagnosis not present

## 2018-10-29 IMAGING — CR DG SHOULDER 2+V*L*
2 series · 2 of 2 positions shown · non-contrast
Comparison: None.

CLINICAL DATA: Left shoulder pain post fall.

EXAM:
LEFT SHOULDER - 2+ VIEW

[x shoulder ap left (1 of 2)]
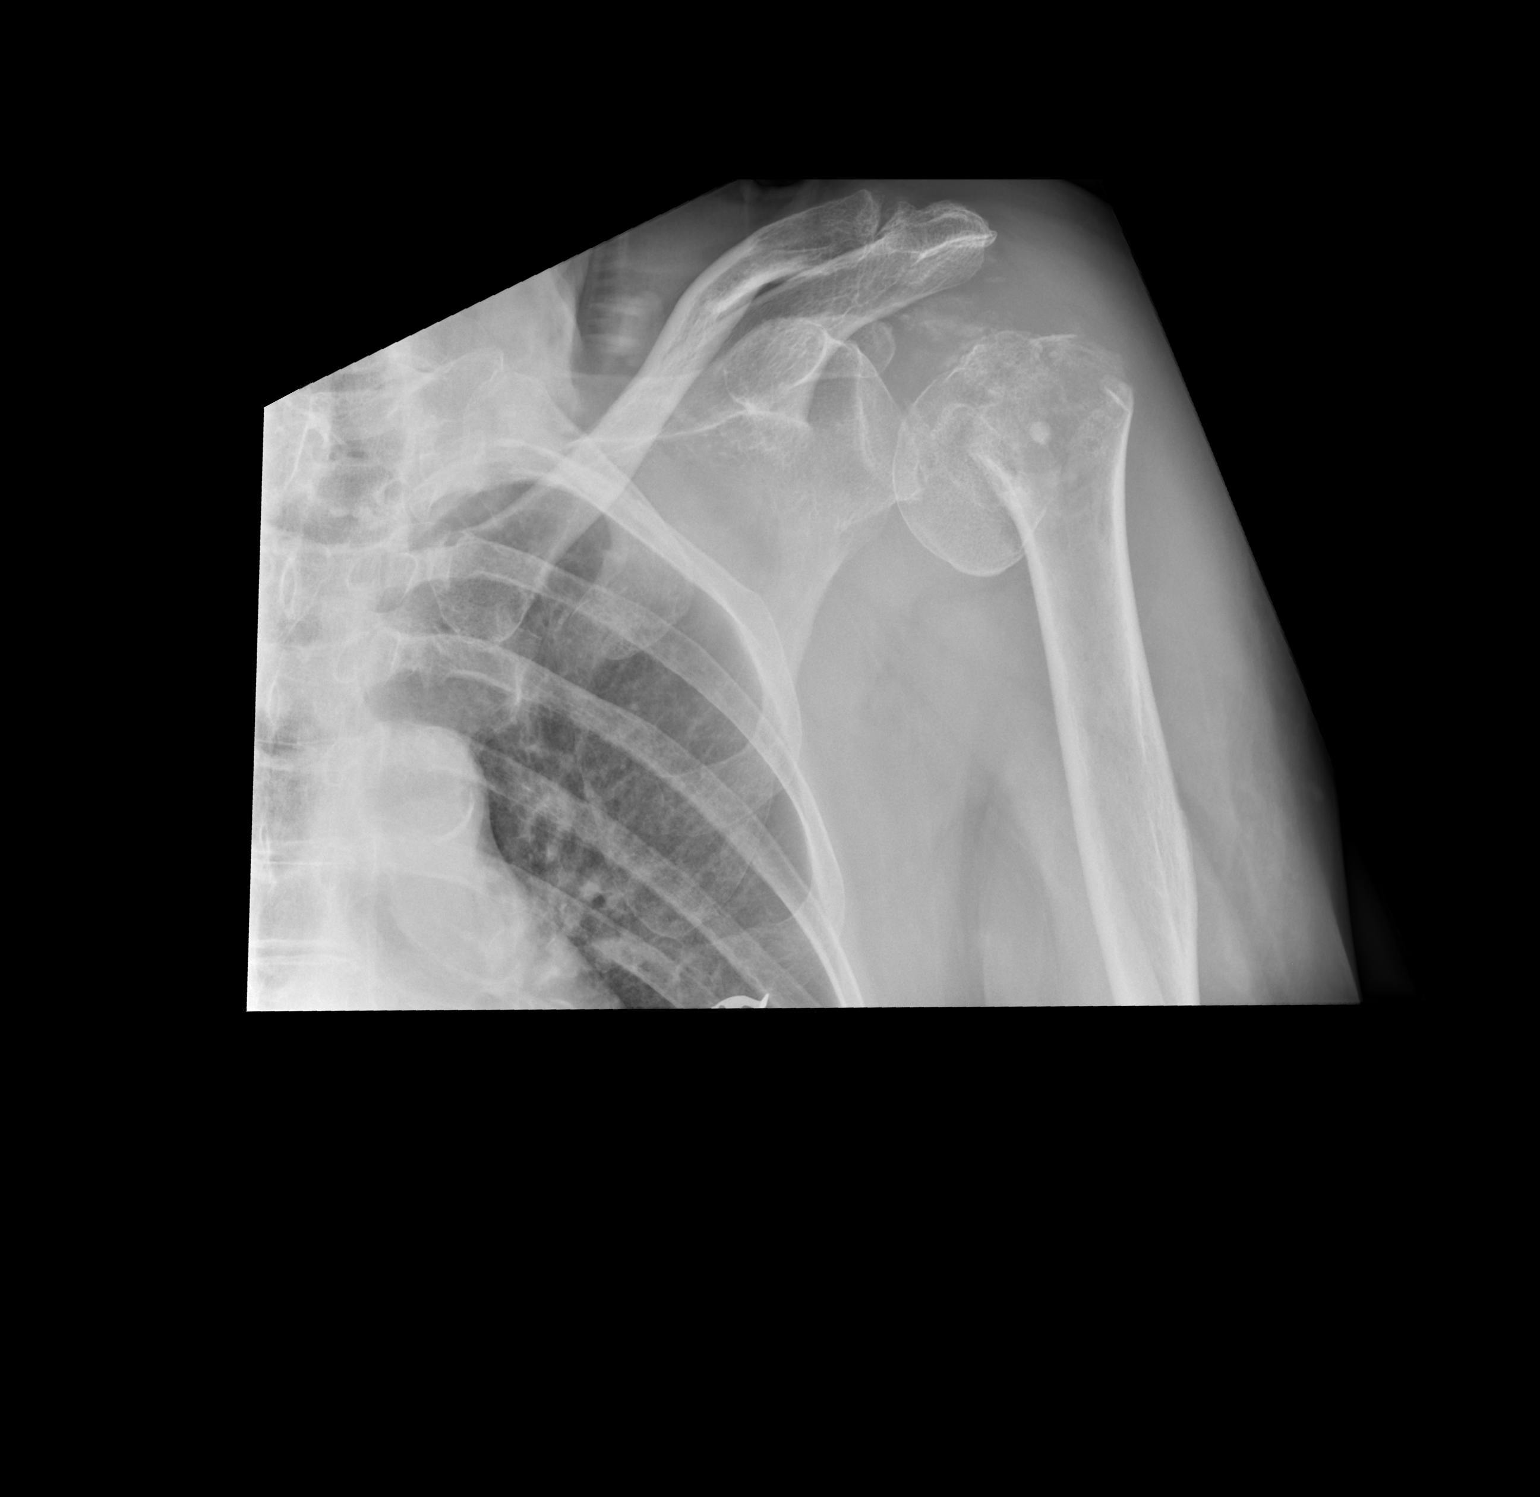

[x shoulder ap left (2 of 2)]
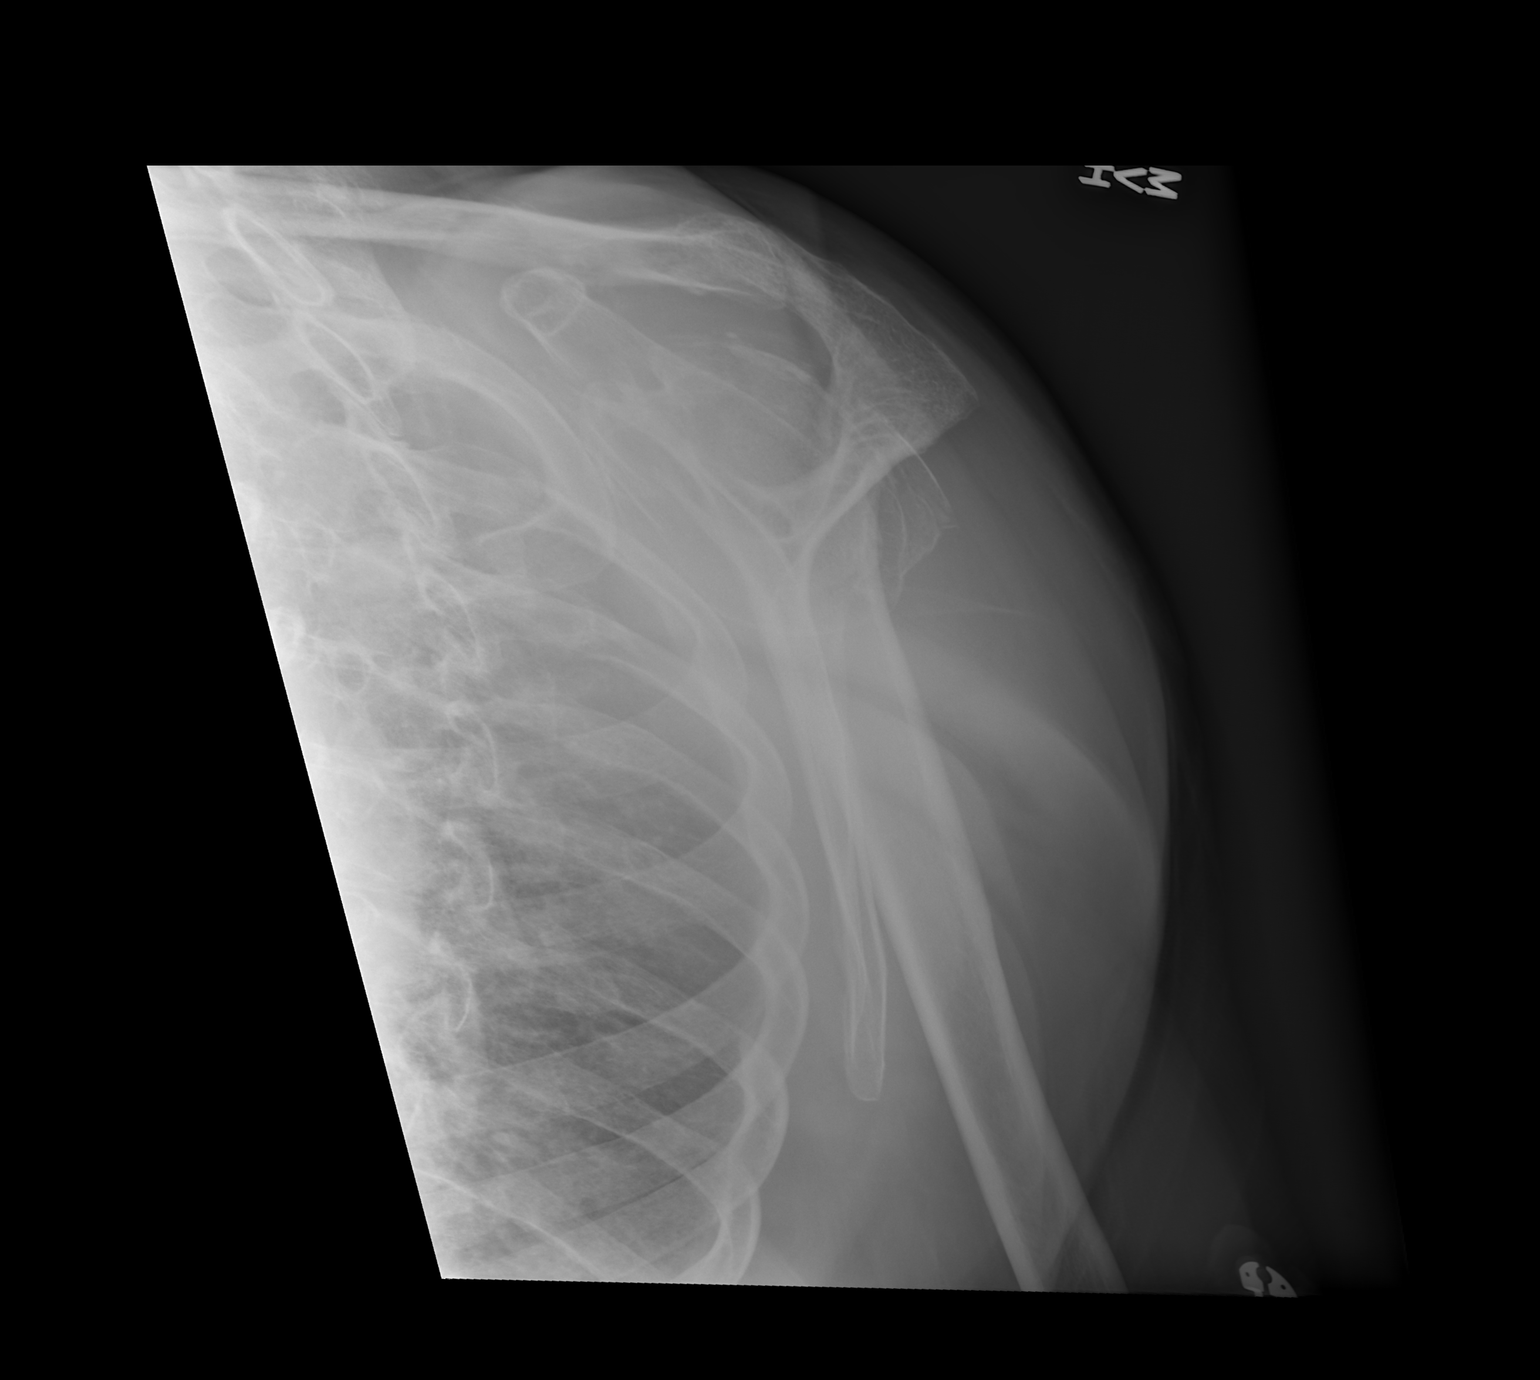

[2 of 2 positions shown; findings below may reference images not displayed]

FINDINGS: There is a comminuted impacted fracture of the left humeral neck.
The humeral head is dislocated inferiorly an rotated in relation to
the glenoid. The distal fracture fragment of the humerus is drawn
superiorly. There is an associated soft tissue swelling.
IMPRESSION: Comminuted impacted fracture dislocation of the left humeral neck.

## 2018-11-05 DIAGNOSIS — K219 Gastro-esophageal reflux disease without esophagitis: Secondary | ICD-10-CM | POA: Diagnosis not present

## 2018-11-05 DIAGNOSIS — J209 Acute bronchitis, unspecified: Secondary | ICD-10-CM | POA: Diagnosis not present

## 2018-11-05 DIAGNOSIS — E039 Hypothyroidism, unspecified: Secondary | ICD-10-CM | POA: Diagnosis not present

## 2018-11-05 DIAGNOSIS — M1000 Idiopathic gout, unspecified site: Secondary | ICD-10-CM | POA: Diagnosis not present

## 2018-11-05 DIAGNOSIS — I10 Essential (primary) hypertension: Secondary | ICD-10-CM | POA: Diagnosis not present

## 2018-11-05 DIAGNOSIS — F339 Major depressive disorder, recurrent, unspecified: Secondary | ICD-10-CM | POA: Diagnosis not present

## 2018-11-05 DIAGNOSIS — R296 Repeated falls: Secondary | ICD-10-CM | POA: Diagnosis not present

## 2018-11-05 DIAGNOSIS — N39498 Other specified urinary incontinence: Secondary | ICD-10-CM | POA: Diagnosis not present

## 2018-11-05 DIAGNOSIS — N390 Urinary tract infection, site not specified: Secondary | ICD-10-CM | POA: Diagnosis not present

## 2018-11-05 DIAGNOSIS — E782 Mixed hyperlipidemia: Secondary | ICD-10-CM | POA: Diagnosis not present

## 2018-11-05 DIAGNOSIS — E559 Vitamin D deficiency, unspecified: Secondary | ICD-10-CM | POA: Diagnosis not present

## 2018-11-05 DIAGNOSIS — R269 Unspecified abnormalities of gait and mobility: Secondary | ICD-10-CM | POA: Diagnosis not present

## 2018-11-17 DIAGNOSIS — G301 Alzheimer's disease with late onset: Secondary | ICD-10-CM | POA: Diagnosis not present

## 2018-11-17 DIAGNOSIS — F331 Major depressive disorder, recurrent, moderate: Secondary | ICD-10-CM | POA: Diagnosis not present

## 2018-11-17 DIAGNOSIS — F0280 Dementia in other diseases classified elsewhere without behavioral disturbance: Secondary | ICD-10-CM | POA: Diagnosis not present

## 2018-12-03 DIAGNOSIS — E782 Mixed hyperlipidemia: Secondary | ICD-10-CM | POA: Diagnosis not present

## 2018-12-03 DIAGNOSIS — J209 Acute bronchitis, unspecified: Secondary | ICD-10-CM | POA: Diagnosis not present

## 2018-12-03 DIAGNOSIS — R296 Repeated falls: Secondary | ICD-10-CM | POA: Diagnosis not present

## 2018-12-03 DIAGNOSIS — I10 Essential (primary) hypertension: Secondary | ICD-10-CM | POA: Diagnosis not present

## 2018-12-03 DIAGNOSIS — M1000 Idiopathic gout, unspecified site: Secondary | ICD-10-CM | POA: Diagnosis not present

## 2018-12-03 DIAGNOSIS — N390 Urinary tract infection, site not specified: Secondary | ICD-10-CM | POA: Diagnosis not present

## 2018-12-03 DIAGNOSIS — E559 Vitamin D deficiency, unspecified: Secondary | ICD-10-CM | POA: Diagnosis not present

## 2018-12-03 DIAGNOSIS — N39498 Other specified urinary incontinence: Secondary | ICD-10-CM | POA: Diagnosis not present

## 2018-12-03 DIAGNOSIS — E039 Hypothyroidism, unspecified: Secondary | ICD-10-CM | POA: Diagnosis not present

## 2018-12-03 DIAGNOSIS — F339 Major depressive disorder, recurrent, unspecified: Secondary | ICD-10-CM | POA: Diagnosis not present

## 2018-12-03 DIAGNOSIS — K219 Gastro-esophageal reflux disease without esophagitis: Secondary | ICD-10-CM | POA: Diagnosis not present

## 2018-12-03 DIAGNOSIS — R269 Unspecified abnormalities of gait and mobility: Secondary | ICD-10-CM | POA: Diagnosis not present

## 2018-12-15 DIAGNOSIS — F0280 Dementia in other diseases classified elsewhere without behavioral disturbance: Secondary | ICD-10-CM | POA: Diagnosis not present

## 2018-12-15 DIAGNOSIS — G301 Alzheimer's disease with late onset: Secondary | ICD-10-CM | POA: Diagnosis not present

## 2018-12-15 DIAGNOSIS — F331 Major depressive disorder, recurrent, moderate: Secondary | ICD-10-CM | POA: Diagnosis not present

## 2018-12-16 DIAGNOSIS — E559 Vitamin D deficiency, unspecified: Secondary | ICD-10-CM | POA: Diagnosis not present

## 2018-12-30 DIAGNOSIS — Z20828 Contact with and (suspected) exposure to other viral communicable diseases: Secondary | ICD-10-CM | POA: Diagnosis not present

## 2018-12-31 DIAGNOSIS — R296 Repeated falls: Secondary | ICD-10-CM | POA: Diagnosis not present

## 2018-12-31 DIAGNOSIS — N39498 Other specified urinary incontinence: Secondary | ICD-10-CM | POA: Diagnosis not present

## 2018-12-31 DIAGNOSIS — I10 Essential (primary) hypertension: Secondary | ICD-10-CM | POA: Diagnosis not present

## 2018-12-31 DIAGNOSIS — E782 Mixed hyperlipidemia: Secondary | ICD-10-CM | POA: Diagnosis not present

## 2018-12-31 DIAGNOSIS — E559 Vitamin D deficiency, unspecified: Secondary | ICD-10-CM | POA: Diagnosis not present

## 2018-12-31 DIAGNOSIS — F339 Major depressive disorder, recurrent, unspecified: Secondary | ICD-10-CM | POA: Diagnosis not present

## 2018-12-31 DIAGNOSIS — R269 Unspecified abnormalities of gait and mobility: Secondary | ICD-10-CM | POA: Diagnosis not present

## 2018-12-31 DIAGNOSIS — M1000 Idiopathic gout, unspecified site: Secondary | ICD-10-CM | POA: Diagnosis not present

## 2018-12-31 DIAGNOSIS — J209 Acute bronchitis, unspecified: Secondary | ICD-10-CM | POA: Diagnosis not present

## 2018-12-31 DIAGNOSIS — N390 Urinary tract infection, site not specified: Secondary | ICD-10-CM | POA: Diagnosis not present

## 2018-12-31 DIAGNOSIS — K219 Gastro-esophageal reflux disease without esophagitis: Secondary | ICD-10-CM | POA: Diagnosis not present

## 2018-12-31 DIAGNOSIS — E039 Hypothyroidism, unspecified: Secondary | ICD-10-CM | POA: Diagnosis not present

## 2019-01-12 DIAGNOSIS — F0280 Dementia in other diseases classified elsewhere without behavioral disturbance: Secondary | ICD-10-CM | POA: Diagnosis not present

## 2019-01-12 DIAGNOSIS — F331 Major depressive disorder, recurrent, moderate: Secondary | ICD-10-CM | POA: Diagnosis not present

## 2019-01-12 DIAGNOSIS — G301 Alzheimer's disease with late onset: Secondary | ICD-10-CM | POA: Diagnosis not present

## 2019-01-14 DIAGNOSIS — G301 Alzheimer's disease with late onset: Secondary | ICD-10-CM | POA: Diagnosis not present

## 2019-01-14 DIAGNOSIS — F0280 Dementia in other diseases classified elsewhere without behavioral disturbance: Secondary | ICD-10-CM | POA: Diagnosis not present

## 2019-01-14 DIAGNOSIS — F331 Major depressive disorder, recurrent, moderate: Secondary | ICD-10-CM | POA: Diagnosis not present

## 2019-01-28 DIAGNOSIS — E559 Vitamin D deficiency, unspecified: Secondary | ICD-10-CM | POA: Diagnosis not present

## 2019-01-28 DIAGNOSIS — M1000 Idiopathic gout, unspecified site: Secondary | ICD-10-CM | POA: Diagnosis not present

## 2019-01-28 DIAGNOSIS — N390 Urinary tract infection, site not specified: Secondary | ICD-10-CM | POA: Diagnosis not present

## 2019-01-28 DIAGNOSIS — J209 Acute bronchitis, unspecified: Secondary | ICD-10-CM | POA: Diagnosis not present

## 2019-01-28 DIAGNOSIS — I10 Essential (primary) hypertension: Secondary | ICD-10-CM | POA: Diagnosis not present

## 2019-01-28 DIAGNOSIS — E039 Hypothyroidism, unspecified: Secondary | ICD-10-CM | POA: Diagnosis not present

## 2019-01-28 DIAGNOSIS — E782 Mixed hyperlipidemia: Secondary | ICD-10-CM | POA: Diagnosis not present

## 2019-01-28 DIAGNOSIS — F339 Major depressive disorder, recurrent, unspecified: Secondary | ICD-10-CM | POA: Diagnosis not present

## 2019-01-28 DIAGNOSIS — R296 Repeated falls: Secondary | ICD-10-CM | POA: Diagnosis not present

## 2019-01-28 DIAGNOSIS — N39498 Other specified urinary incontinence: Secondary | ICD-10-CM | POA: Diagnosis not present

## 2019-01-28 DIAGNOSIS — K219 Gastro-esophageal reflux disease without esophagitis: Secondary | ICD-10-CM | POA: Diagnosis not present

## 2019-01-28 DIAGNOSIS — R269 Unspecified abnormalities of gait and mobility: Secondary | ICD-10-CM | POA: Diagnosis not present

## 2019-02-09 DIAGNOSIS — G301 Alzheimer's disease with late onset: Secondary | ICD-10-CM | POA: Diagnosis not present

## 2019-02-09 DIAGNOSIS — F331 Major depressive disorder, recurrent, moderate: Secondary | ICD-10-CM | POA: Diagnosis not present

## 2019-02-09 DIAGNOSIS — F064 Anxiety disorder due to known physiological condition: Secondary | ICD-10-CM | POA: Diagnosis not present

## 2019-02-09 DIAGNOSIS — F0280 Dementia in other diseases classified elsewhere without behavioral disturbance: Secondary | ICD-10-CM | POA: Diagnosis not present

## 2019-02-25 DIAGNOSIS — I10 Essential (primary) hypertension: Secondary | ICD-10-CM | POA: Diagnosis not present

## 2019-02-25 DIAGNOSIS — M109 Gout, unspecified: Secondary | ICD-10-CM | POA: Diagnosis not present

## 2019-02-25 DIAGNOSIS — E785 Hyperlipidemia, unspecified: Secondary | ICD-10-CM | POA: Diagnosis not present

## 2019-03-09 DIAGNOSIS — F0280 Dementia in other diseases classified elsewhere without behavioral disturbance: Secondary | ICD-10-CM | POA: Diagnosis not present

## 2019-03-09 DIAGNOSIS — F331 Major depressive disorder, recurrent, moderate: Secondary | ICD-10-CM | POA: Diagnosis not present

## 2019-03-09 DIAGNOSIS — G301 Alzheimer's disease with late onset: Secondary | ICD-10-CM | POA: Diagnosis not present

## 2019-03-15 DIAGNOSIS — R296 Repeated falls: Secondary | ICD-10-CM | POA: Diagnosis not present

## 2019-03-15 DIAGNOSIS — E785 Hyperlipidemia, unspecified: Secondary | ICD-10-CM | POA: Diagnosis not present

## 2019-03-15 DIAGNOSIS — I10 Essential (primary) hypertension: Secondary | ICD-10-CM | POA: Diagnosis not present

## 2019-03-15 DIAGNOSIS — E559 Vitamin D deficiency, unspecified: Secondary | ICD-10-CM | POA: Diagnosis not present

## 2019-03-15 DIAGNOSIS — R3981 Functional urinary incontinence: Secondary | ICD-10-CM | POA: Diagnosis not present

## 2019-03-15 DIAGNOSIS — K219 Gastro-esophageal reflux disease without esophagitis: Secondary | ICD-10-CM | POA: Diagnosis not present

## 2019-03-15 DIAGNOSIS — M109 Gout, unspecified: Secondary | ICD-10-CM | POA: Diagnosis not present

## 2019-03-15 DIAGNOSIS — J209 Acute bronchitis, unspecified: Secondary | ICD-10-CM | POA: Diagnosis not present

## 2019-03-18 DIAGNOSIS — I10 Essential (primary) hypertension: Secondary | ICD-10-CM | POA: Diagnosis not present

## 2019-03-18 DIAGNOSIS — E785 Hyperlipidemia, unspecified: Secondary | ICD-10-CM | POA: Diagnosis not present

## 2019-03-24 DIAGNOSIS — L603 Nail dystrophy: Secondary | ICD-10-CM | POA: Diagnosis not present

## 2019-03-24 DIAGNOSIS — I739 Peripheral vascular disease, unspecified: Secondary | ICD-10-CM | POA: Diagnosis not present

## 2019-03-24 DIAGNOSIS — Q845 Enlarged and hypertrophic nails: Secondary | ICD-10-CM | POA: Diagnosis not present

## 2019-03-25 DIAGNOSIS — E782 Mixed hyperlipidemia: Secondary | ICD-10-CM | POA: Diagnosis not present

## 2019-03-25 DIAGNOSIS — I10 Essential (primary) hypertension: Secondary | ICD-10-CM | POA: Diagnosis not present

## 2019-03-25 DIAGNOSIS — M1000 Idiopathic gout, unspecified site: Secondary | ICD-10-CM | POA: Diagnosis not present

## 2019-04-07 DIAGNOSIS — F331 Major depressive disorder, recurrent, moderate: Secondary | ICD-10-CM | POA: Diagnosis not present

## 2019-04-07 DIAGNOSIS — F0280 Dementia in other diseases classified elsewhere without behavioral disturbance: Secondary | ICD-10-CM | POA: Diagnosis not present

## 2019-04-07 DIAGNOSIS — G301 Alzheimer's disease with late onset: Secondary | ICD-10-CM | POA: Diagnosis not present

## 2019-04-22 DIAGNOSIS — M1000 Idiopathic gout, unspecified site: Secondary | ICD-10-CM | POA: Diagnosis not present

## 2019-04-22 DIAGNOSIS — I2510 Atherosclerotic heart disease of native coronary artery without angina pectoris: Secondary | ICD-10-CM | POA: Diagnosis not present

## 2019-04-22 DIAGNOSIS — N183 Chronic kidney disease, stage 3 unspecified: Secondary | ICD-10-CM | POA: Diagnosis not present

## 2019-04-22 DIAGNOSIS — N39498 Other specified urinary incontinence: Secondary | ICD-10-CM | POA: Diagnosis not present

## 2019-04-28 DIAGNOSIS — G301 Alzheimer's disease with late onset: Secondary | ICD-10-CM | POA: Diagnosis not present

## 2019-04-28 DIAGNOSIS — F0280 Dementia in other diseases classified elsewhere without behavioral disturbance: Secondary | ICD-10-CM | POA: Diagnosis not present

## 2019-05-05 DIAGNOSIS — F331 Major depressive disorder, recurrent, moderate: Secondary | ICD-10-CM | POA: Diagnosis not present

## 2019-05-05 DIAGNOSIS — G301 Alzheimer's disease with late onset: Secondary | ICD-10-CM | POA: Diagnosis not present

## 2019-05-05 DIAGNOSIS — F0280 Dementia in other diseases classified elsewhere without behavioral disturbance: Secondary | ICD-10-CM | POA: Diagnosis not present

## 2019-05-11 DIAGNOSIS — E782 Mixed hyperlipidemia: Secondary | ICD-10-CM | POA: Diagnosis not present

## 2019-05-11 DIAGNOSIS — E559 Vitamin D deficiency, unspecified: Secondary | ICD-10-CM | POA: Diagnosis not present

## 2019-05-11 DIAGNOSIS — M1000 Idiopathic gout, unspecified site: Secondary | ICD-10-CM | POA: Diagnosis not present

## 2019-05-11 DIAGNOSIS — Z23 Encounter for immunization: Secondary | ICD-10-CM | POA: Diagnosis not present

## 2019-05-11 DIAGNOSIS — I10 Essential (primary) hypertension: Secondary | ICD-10-CM | POA: Diagnosis not present

## 2019-05-27 DIAGNOSIS — E559 Vitamin D deficiency, unspecified: Secondary | ICD-10-CM | POA: Diagnosis not present

## 2019-05-27 DIAGNOSIS — M1000 Idiopathic gout, unspecified site: Secondary | ICD-10-CM | POA: Diagnosis not present

## 2019-05-27 DIAGNOSIS — I10 Essential (primary) hypertension: Secondary | ICD-10-CM | POA: Diagnosis not present

## 2019-06-10 DIAGNOSIS — G301 Alzheimer's disease with late onset: Secondary | ICD-10-CM | POA: Diagnosis not present

## 2019-06-10 DIAGNOSIS — F0280 Dementia in other diseases classified elsewhere without behavioral disturbance: Secondary | ICD-10-CM | POA: Diagnosis not present

## 2019-06-10 DIAGNOSIS — F331 Major depressive disorder, recurrent, moderate: Secondary | ICD-10-CM | POA: Diagnosis not present

## 2019-06-21 DIAGNOSIS — Q845 Enlarged and hypertrophic nails: Secondary | ICD-10-CM | POA: Diagnosis not present

## 2019-06-21 DIAGNOSIS — B351 Tinea unguium: Secondary | ICD-10-CM | POA: Diagnosis not present

## 2019-06-21 DIAGNOSIS — E119 Type 2 diabetes mellitus without complications: Secondary | ICD-10-CM | POA: Diagnosis not present

## 2019-06-21 DIAGNOSIS — I739 Peripheral vascular disease, unspecified: Secondary | ICD-10-CM | POA: Diagnosis not present

## 2019-08-22 ENCOUNTER — Emergency Department (HOSPITAL_COMMUNITY)
Admission: EM | Admit: 2019-08-22 | Discharge: 2019-08-22 | Disposition: A | Discharge status: Home / Self Care | Payer: Medicare Other | Attending: Emergency Medicine | Admitting: Emergency Medicine

## 2019-08-22 ENCOUNTER — Other Ambulatory Visit: Payer: Self-pay

## 2019-08-22 ENCOUNTER — Encounter (HOSPITAL_COMMUNITY): Payer: Self-pay | Admitting: Emergency Medicine

## 2019-08-22 ENCOUNTER — Emergency Department (HOSPITAL_COMMUNITY): Payer: Medicare Other

## 2019-08-22 DIAGNOSIS — E119 Type 2 diabetes mellitus without complications: Secondary | ICD-10-CM | POA: Insufficient documentation

## 2019-08-22 DIAGNOSIS — M25511 Pain in right shoulder: Secondary | ICD-10-CM | POA: Insufficient documentation

## 2019-08-22 DIAGNOSIS — Y939 Activity, unspecified: Secondary | ICD-10-CM | POA: Diagnosis not present

## 2019-08-22 DIAGNOSIS — Z79899 Other long term (current) drug therapy: Secondary | ICD-10-CM | POA: Insufficient documentation

## 2019-08-22 DIAGNOSIS — Y92129 Unspecified place in nursing home as the place of occurrence of the external cause: Secondary | ICD-10-CM | POA: Insufficient documentation

## 2019-08-22 DIAGNOSIS — S0990XA Unspecified injury of head, initial encounter: Secondary | ICD-10-CM | POA: Insufficient documentation

## 2019-08-22 DIAGNOSIS — W19XXXA Unspecified fall, initial encounter: Secondary | ICD-10-CM | POA: Insufficient documentation

## 2019-08-22 DIAGNOSIS — Y999 Unspecified external cause status: Secondary | ICD-10-CM | POA: Insufficient documentation

## 2019-08-22 DIAGNOSIS — I10 Essential (primary) hypertension: Secondary | ICD-10-CM | POA: Insufficient documentation

## 2019-08-22 DIAGNOSIS — Z87891 Personal history of nicotine dependence: Secondary | ICD-10-CM | POA: Diagnosis not present

## 2019-08-22 NOTE — ED Provider Notes (Signed)
[facility] DEPT Provider Note   CSN: [id number] Arrival date & time: 08/22/19  0854     History Chief Complaint  Patient presents with  . Fall    Emily Waller is a 86 y.o. female.  Patient is an 86 year old female with a history of hypertension, hyperlipidemia, diabetes, dementia who lives at a skilled facility and had an unwitnessed fall today.  Patient states that she cannot recall if her feet got tangled or what precipitated the fall but she does remember falling and states she hit her shoulder and her head.  She reports while laying on the floor her right shoulder was really hurting but now it feels normal.  She denies any neck pain, chest pain, abdominal pain.  She has no complaints of lower extremity pain.  She denies any shortness of breath, nausea, dizziness or chest pain.  Patient does not take anticoagulation.  The history is provided by the patient and the nursing home.  Fall This is a new problem. The current episode started 1 to 2 hours ago. The problem occurs constantly. The problem has been resolved. Associated symptoms comments: Was having right shoulder pain after the fall but better now and not hurting.  Did hit head on the hard wood floor as well but only mild headache..       Past Medical History:  Diagnosis Date  . Anxiety   . Depression   . Diabetes mellitus without complication ([place])   . Hyperlipidemia   . Hypertension   . Renal disorder    Kidney disease  . Vitamin D deficiency     Patient Active Problem List   Diagnosis Date Noted  . Symptomatic bradycardia 07/07/2018  . Renal insufficiency 07/07/2018  . Diet-controlled diabetes mellitus ([place]) 07/07/2018  . Itching 09/12/2015  . Diabetes ([place]) 09/12/2015  . HTN (hypertension) 09/12/2015  . Depression 09/12/2015    History reviewed. No pertinent surgical history.   OB History   No obstetric history on file.     Family History  Problem Relation Age of Onset   . Hyperlipidemia Mother   . Hypertension Mother   . Cancer Father   . Heart disease Father   . CAD Maternal Grandmother   . CAD Maternal Grandfather   . Lung cancer Sister     Social History   Tobacco Use  . Smoking status: Former Smoker    Quit date: 10/02/1969    Years since quitting: 49.9  . Smokeless tobacco: Never Used  Substance Use Topics  . Alcohol use: No    Alcohol/week: 0.0 standard drinks  . Drug use: No    Home Medications Prior to Admission medications   Medication Sig Start Date End Date Taking? Authorizing Provider  allopurinol (ZYLOPRIM) 300 MG tablet Take 300 mg by mouth daily.    [provider]  amLODipine (NORVASC) 5 MG tablet Take 5 mg by mouth daily.    [provider]  atorvastatin (LIPITOR) 10 MG tablet Take 10 mg by mouth daily.    [provider]  ergocalciferol (VITAMIN D2) 1.25 MG (50000 UT) capsule Take 50,000 Units by mouth every Tuesday.    [provider]  ezetimibe (ZETIA) 10 MG tablet Take 10 mg by mouth daily.    [provider]  KRILL OIL PO Take 1 capsule by mouth daily.    [provider]  levofloxacin (LEVAQUIN) 500 MG tablet Take 500 mg by mouth daily. Started on 03/11 for 7 days  [provider]  oxybutynin (DITROPAN-XL) 10 MG 24 hr tablet Take 10 mg by mouth daily.    [provider]  pantoprazole (PROTONIX) 40 MG tablet Take 40 mg by mouth daily.    [provider]  sertraline (ZOLOFT) 50 MG tablet Take 1 tablet (50 mg total) by mouth daily. 07/11/18   Rai, Ripudeep Raliegh Ip, MD  Turmeric 500 MG CAPS Take 500 mg by mouth daily.    [provider]    Allergies    Colcrys [colchicine] and Lovastatin  Review of Systems   Review of Systems  All other systems reviewed and are negative.   Physical Exam Updated Vital Signs BP (!) 155/58 (BP Location: Left Arm)   Pulse 60   Temp 98.3 F (36.8 C) (Oral)   Resp 16   SpO2 99%   Physical  Exam Vitals and nursing note reviewed.  Constitutional:      General: She is not in acute distress.    Appearance: Normal appearance. She is well-developed and normal weight.  HENT:     Head: Normocephalic and atraumatic.  Eyes:     Pupils: Pupils are equal, round, and reactive to light.  Cardiovascular:     Rate and Rhythm: Normal rate and regular rhythm.     Heart sounds: Normal heart sounds. No murmur. No friction rub.  Pulmonary:     Effort: Pulmonary effort is normal.     Breath sounds: Normal breath sounds. No wheezing or rales.  Abdominal:     General: Bowel sounds are normal. There is no distension.     Palpations: Abdomen is soft.     Tenderness: There is no abdominal tenderness. There is no guarding or rebound.  Musculoskeletal:        General: No tenderness. Normal range of motion.     Right shoulder: Normal.     Left shoulder: Normal.     Cervical back: Normal range of motion and neck supple. No tenderness. No spinous process tenderness or muscular tenderness.     Right hip: Normal.     Left hip: Normal.     Right ankle: Normal.     Left ankle: Normal.     Comments: No edema  Skin:    General: Skin is warm and dry.     Capillary Refill: Capillary refill takes less than 2 seconds.     Findings: No rash.  Neurological:     General: No focal deficit present.     Mental Status: She is alert and oriented to person, place, and time. Mental status is at baseline.     Cranial Nerves: No cranial nerve deficit.  Psychiatric:        Mood and Affect: Mood normal.        Behavior: Behavior normal.        Thought Content: Thought content normal.     ED Results / Procedures / Treatments   Labs (all labs ordered are listed, but only abnormal results are displayed) Labs Reviewed - No data to display  EKG None  Radiology CT Head Wo Contrast  Result Date: 08/22/2019 CLINICAL DATA:  Unwitnessed fall. Right shoulder and head injury. No loss of consciousness. EXAM: CT HEAD  WITHOUT CONTRAST TECHNIQUE: Contiguous axial images were obtained from the base of the skull through the vertex without intravenous contrast. COMPARISON:  CT head 06/18/2017. FINDINGS: Brain: There is no evidence of acute intracranial hemorrhage, mass lesion, brain edema or extra-axial fluid collection. Stable atrophy with diffuse prominence  of the ventricles and subarachnoid spaces. 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 There is no CT evidence of acute cortical infarction. Vascular: Intracranial vascular calcifications. No hyperdense vessel identified. Skull: Negative for fracture or focal lesion. Sinuses/Orbits: Minimal dependent opacity in the left division of the sphenoid sinus. The additional visualized paranasal sinuses, mastoid air cells and middle ears are clear. No significant orbital findings. Other: None. IMPRESSION: 1. No acute intracranial or calvarial findings. 2. 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 Electronically Signed   By: Richardean Sale M.D.   On: 08/22/2019 11:14    Procedures Procedures (including critical care time)  Medications Ordered in ED Medications - No data to display  ED Course  I have reviewed the triage vital signs and the nursing notes.  Pertinent labs & imaging results that were available during my care of the patient were reviewed by me and considered in my medical decision making (see chart for details).    MDM Rules/Calculators/A&P                      Pt is an elderly female who had an unwitnessed fall today.  However patient denies any other symptoms and was most likely mechanical.  Patient did report hitting her head and her right shoulder.  She was initially having significant shoulder pain but is now able to completely range her shoulder without tenderness.  No significant ecchymosis on the scalp.  She has no neck  pain.  She does not take anticoagulation.  However due to the report of her hitting her head we will do a CT to ensure no acute findings.  This time patient does not need imaging of her shoulders as they are both normal.  11:21 AM CT wnl and pt clear for d/c.  Final Clinical Impression(s) / ED Diagnoses Final diagnoses:  Fall, initial encounter  Minor head injury, initial encounter    Rx / DC Orders ED Discharge Orders    None       Blanchie Dessert, MD 08/22/19 1123

## 2019-08-22 NOTE — ED Notes (Signed)
Attempted to call report to [facility] on [address], unable to get the appropriate staff for report.

## 2019-08-22 NOTE — Discharge Instructions (Signed)
Head CT was normal today.  Normal shoulder and extremity exam.

## 2019-08-22 NOTE — ED Triage Notes (Addendum)
Per EMS pt unwitnessed fall with right shoulder and right head impact. Pt denies LOC. No obvious deformity; no blood thinner. Full ROM to right shoulder; denies pain with triage. Has hx of dementia; per normal.

## 2019-08-22 NOTE — ED Notes (Signed)
Manuella Ghazi, daughter, contact number [phone].

## 2019-08-22 NOTE — ED Notes (Signed)
Notified [facility] for transportation to [facility] on [address].

## 2019-11-18 IMAGING — DX DG CHEST 1V PORT
1 series · 1 of 1 positions shown · non-contrast
Comparison: None available for comparison at time of study
interpretation.

CLINICAL DATA: Hypotensive, bradycardia. History of hypertension
hyperlipidemia.

EXAM:
PORTABLE CHEST 1 VIEW

[chest]
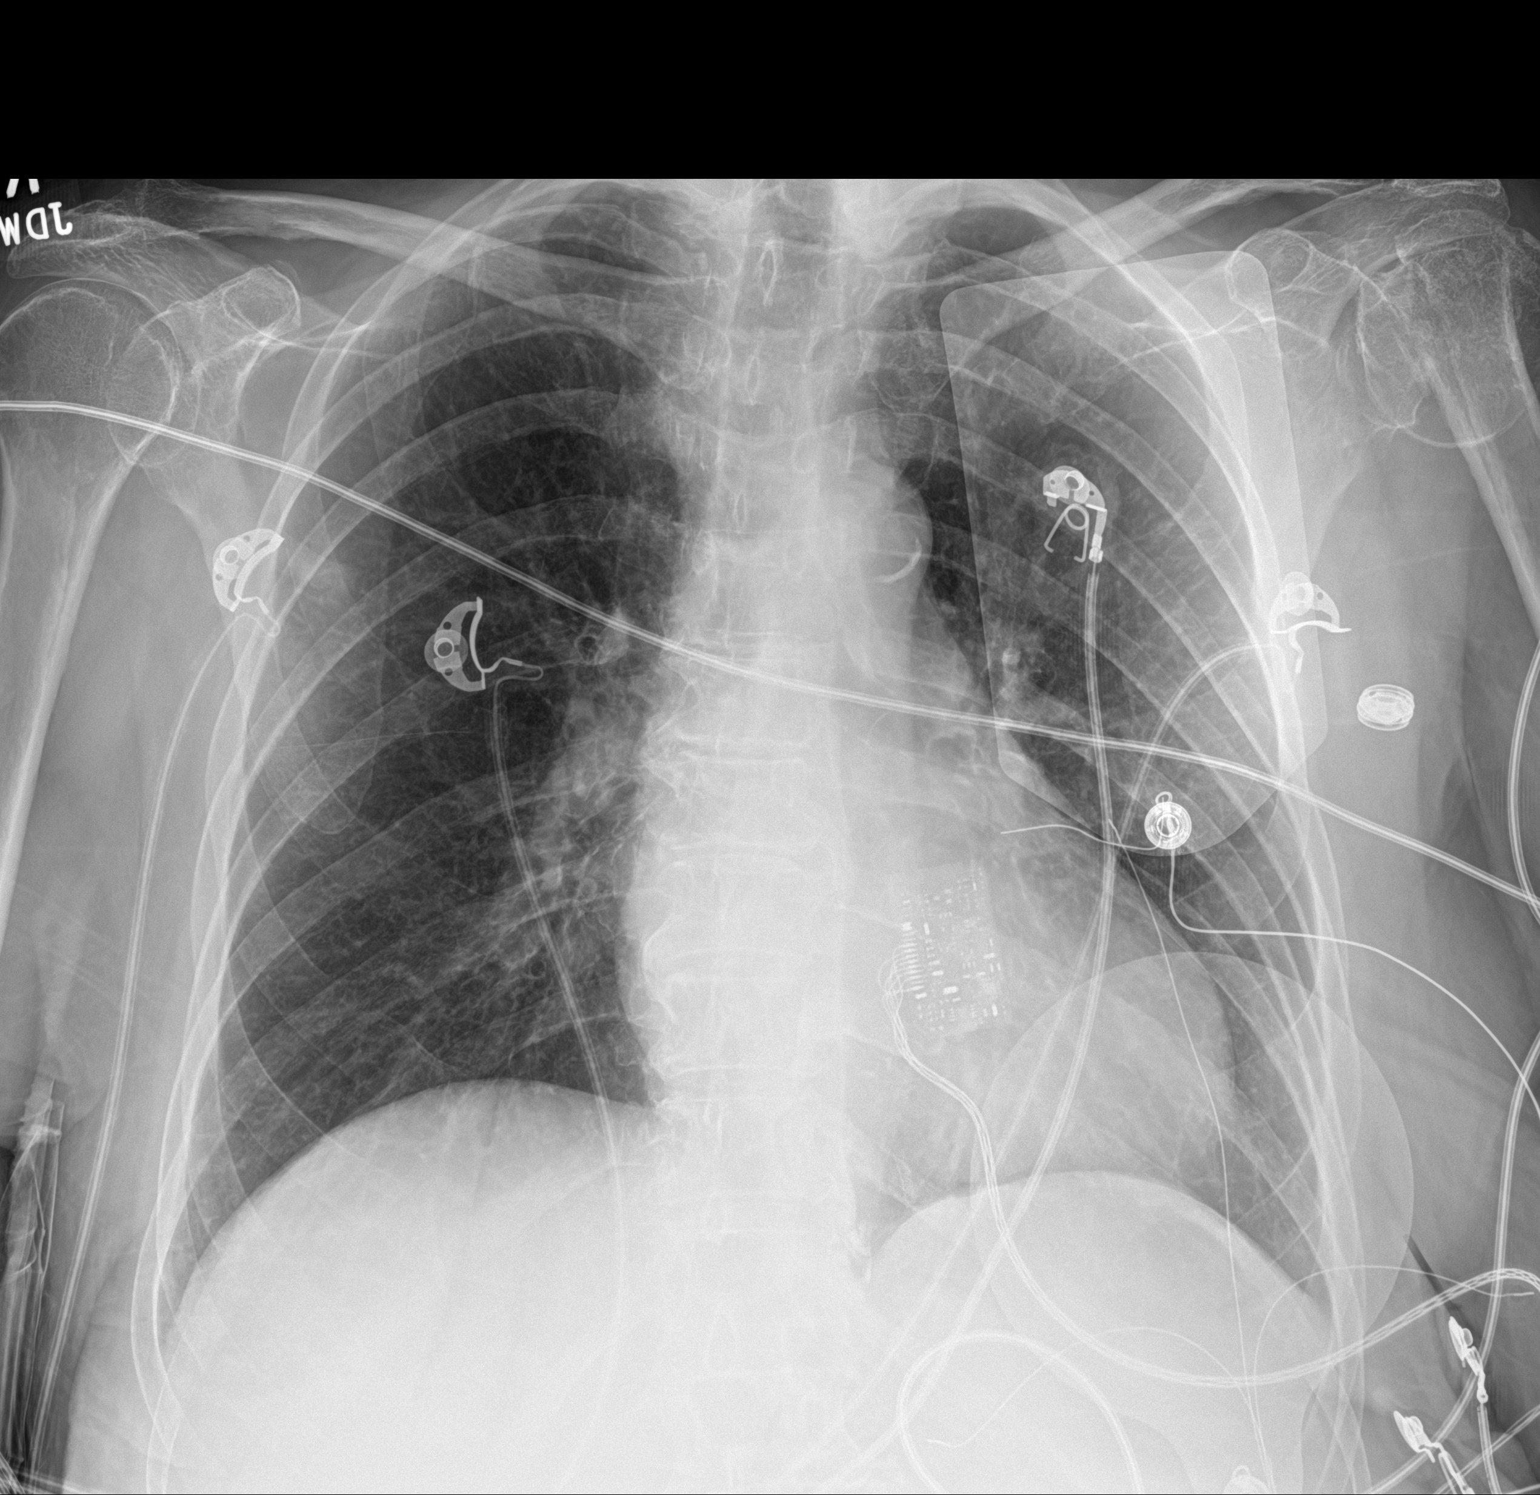

[1 of 1 positions shown; findings below may reference images not displayed]

FINDINGS: Cardiomediastinal silhouette is normal. Calcified aortic arch. No
pleural effusions or focal consolidations. Trachea projects midline
and there is no pneumothorax. Soft tissue planes and included
osseous structures are non-suspicious. Chronic deformity LEFT
humeral head.
IMPRESSION: 1. No acute cardiopulmonary process.
2.  Aortic Atherosclerosis ([id number]).

## 2020-02-12 IMAGING — CR CHEST - 2 VIEW
2 series · 2 of 2 positions shown · non-contrast
Comparison: 07/07/2018

CLINICAL DATA: Worsening cough after beginning antibiotics on
[REDACTED] for pneumonia.

EXAM:
CHEST - 2 VIEW

[w chest lat]
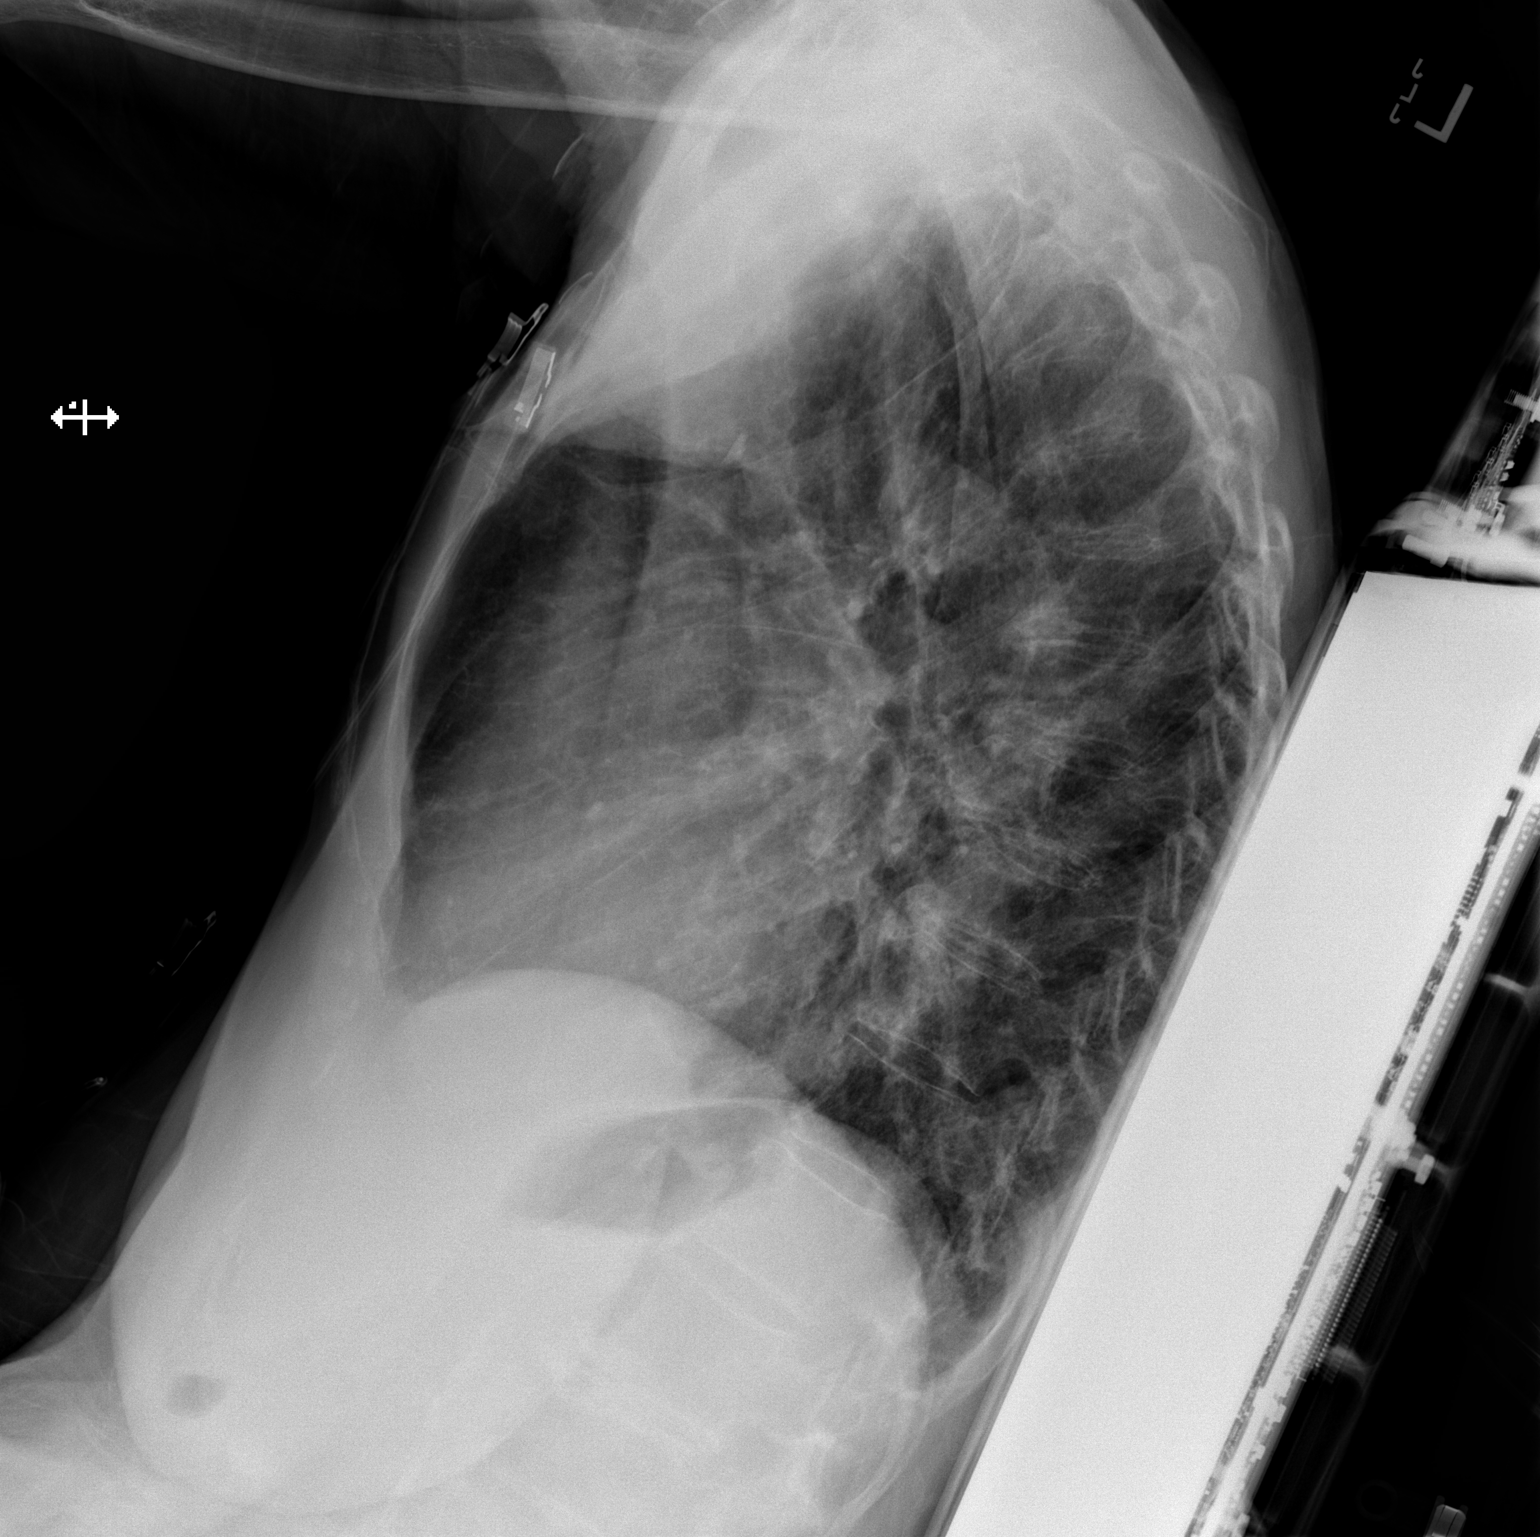

[x chest ap]
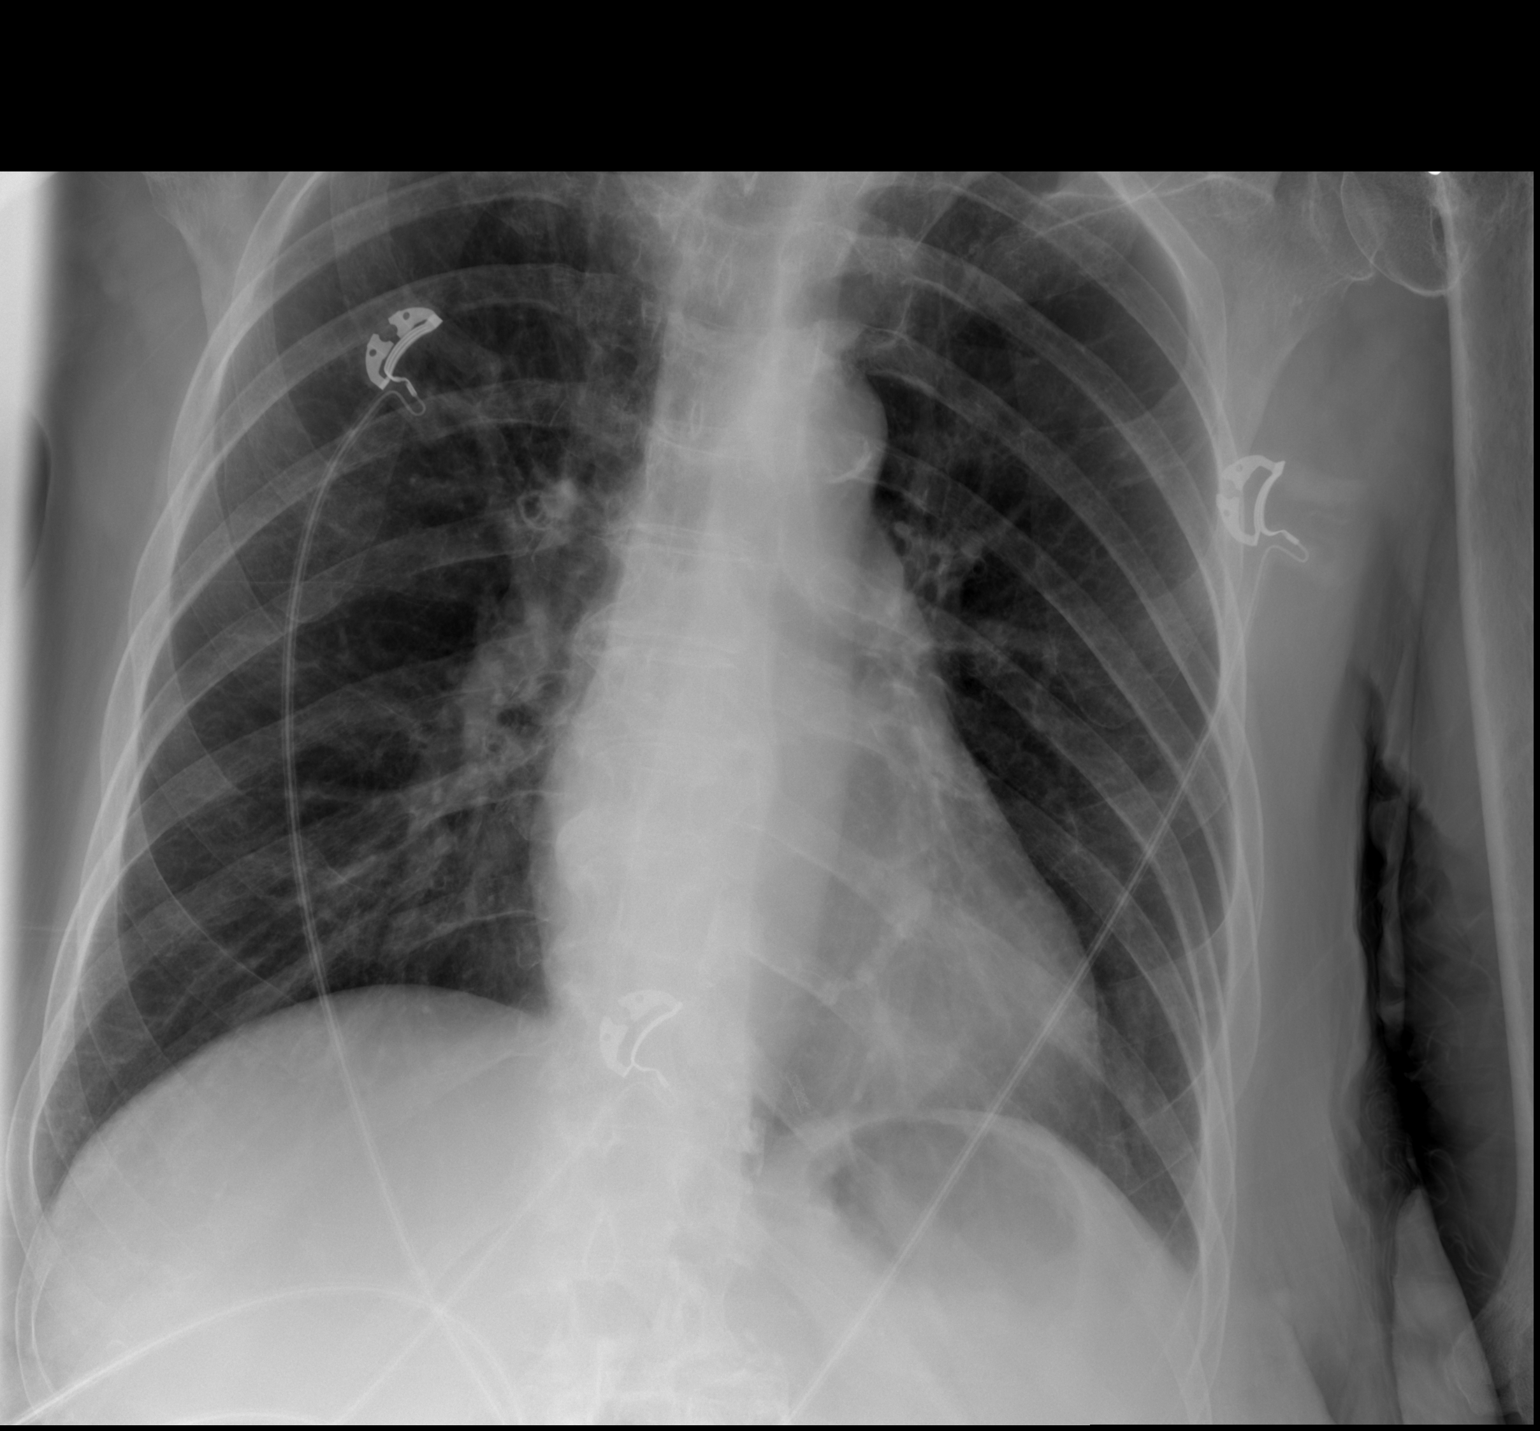

[2 of 2 positions shown; findings below may reference images not displayed]

FINDINGS: Normal heart size and pulmonary vascularity. Calcification of the
mitral valve annulus. Lungs are clear and expanded. Mild
hyperinflation likely emphysematous. No blunting of costophrenic
angles. No pneumothorax. Mediastinal contours appear intact.
Calcification of the aorta. Old healed fracture deformity of the
left proximal humerus.
IMPRESSION: Emphysematous changes in the lungs. No evidence of active pulmonary
disease.

## 2020-11-06 ENCOUNTER — Other Ambulatory Visit (HOSPITAL_COMMUNITY)
Admission: RE | Admit: 2020-11-06 | Discharge: 2020-11-06 | Disposition: A | Discharge status: Home / Self Care | Payer: Medicare Other | Source: Skilled Nursing Facility | Attending: Nurse Practitioner | Admitting: Nurse Practitioner

## 2020-11-06 DIAGNOSIS — Z029 Encounter for administrative examinations, unspecified: Secondary | ICD-10-CM | POA: Insufficient documentation

## 2020-11-06 LAB — URINALYSIS, COMPLETE (UACMP) WITH MICROSCOPIC
Bilirubin Urine: NEGATIVE
Glucose, UA: NEGATIVE mg/dL
Hgb urine dipstick: NEGATIVE
Ketones, ur: NEGATIVE mg/dL
Nitrite: POSITIVE — AB
Protein, ur: NEGATIVE mg/dL
Specific Gravity, Urine: 1.013 (ref 1.005–1.030)
WBC, UA: >50 WBC/hpf — ABNORMAL HIGH (ref 0–5)
pH: 6 (ref 5.0–8.0)

## 2020-11-08 LAB — URINE CULTURE

## 2021-01-15 ENCOUNTER — Other Ambulatory Visit: Payer: Self-pay

## 2021-01-15 ENCOUNTER — Encounter (HOSPITAL_BASED_OUTPATIENT_CLINIC_OR_DEPARTMENT_OTHER): Payer: Self-pay

## 2021-01-15 ENCOUNTER — Emergency Department (HOSPITAL_BASED_OUTPATIENT_CLINIC_OR_DEPARTMENT_OTHER): Payer: Medicare Other

## 2021-01-15 ENCOUNTER — Emergency Department (HOSPITAL_BASED_OUTPATIENT_CLINIC_OR_DEPARTMENT_OTHER)
Admission: EM | Admit: 2021-01-15 | Discharge: 2021-01-16 | Disposition: A | Discharge status: Skilled Nursing Facility | Payer: Medicare Other | Attending: Emergency Medicine | Admitting: Emergency Medicine

## 2021-01-15 DIAGNOSIS — Z79899 Other long term (current) drug therapy: Secondary | ICD-10-CM | POA: Diagnosis not present

## 2021-01-15 DIAGNOSIS — N189 Chronic kidney disease, unspecified: Secondary | ICD-10-CM | POA: Diagnosis not present

## 2021-01-15 DIAGNOSIS — Z20822 Contact with and (suspected) exposure to covid-19: Secondary | ICD-10-CM | POA: Insufficient documentation

## 2021-01-15 DIAGNOSIS — E1122 Type 2 diabetes mellitus with diabetic chronic kidney disease: Secondary | ICD-10-CM | POA: Insufficient documentation

## 2021-01-15 DIAGNOSIS — R059 Cough, unspecified: Secondary | ICD-10-CM | POA: Diagnosis not present

## 2021-01-15 DIAGNOSIS — Z87891 Personal history of nicotine dependence: Secondary | ICD-10-CM | POA: Insufficient documentation

## 2021-01-15 DIAGNOSIS — N3000 Acute cystitis without hematuria: Secondary | ICD-10-CM

## 2021-01-15 DIAGNOSIS — R82998 Other abnormal findings in urine: Secondary | ICD-10-CM | POA: Diagnosis present

## 2021-01-15 DIAGNOSIS — I129 Hypertensive chronic kidney disease with stage 1 through stage 4 chronic kidney disease, or unspecified chronic kidney disease: Secondary | ICD-10-CM | POA: Diagnosis not present

## 2021-01-15 LAB — COMPREHENSIVE METABOLIC PANEL
ALT: 10 U/L (ref 0–44)
AST: 11 U/L — ABNORMAL LOW (ref 15–41)
Albumin: 4 g/dL (ref 3.5–5.0)
Alkaline Phosphatase: 84 U/L (ref 38–126)
Anion gap: 10 (ref 5–15)
BUN: 42 mg/dL — ABNORMAL HIGH (ref 8–23)
CO2: 24 mmol/L (ref 22–32)
Calcium: 8.9 mg/dL (ref 8.9–10.3)
Chloride: 104 mmol/L (ref 98–111)
Creatinine, Ser: 1.4 mg/dL — ABNORMAL HIGH (ref 0.44–1.00)
GFR, Estimated: 36 mL/min — ABNORMAL LOW (ref >60)
Glucose, Bld: 218 mg/dL — ABNORMAL HIGH (ref 70–99)
Potassium: 4.3 mmol/L (ref 3.5–5.1)
Sodium: 138 mmol/L (ref 135–145)
Total Bilirubin: 0.4 mg/dL (ref 0.3–1.2)
Total Protein: 6.9 g/dL (ref 6.5–8.1)

## 2021-01-15 LAB — CBC WITH DIFFERENTIAL/PLATELET
Abs Immature Granulocytes: 0.05 10*3/uL (ref 0.00–0.07)
Basophils Absolute: 0 10*3/uL (ref 0.0–0.1)
Basophils Relative: 0 %
Eosinophils Absolute: 0 10*3/uL (ref 0.0–0.5)
Eosinophils Relative: 0 %
HCT: 37.4 % (ref 36.0–46.0)
Hemoglobin: 12 g/dL (ref 12.0–15.0)
Immature Granulocytes: 0 %
Lymphocytes Relative: 13 %
Lymphs Abs: 1.6 10*3/uL (ref 0.7–4.0)
MCH: 30.8 pg (ref 26.0–34.0)
MCHC: 32.1 g/dL (ref 30.0–36.0)
MCV: 95.9 fL (ref 80.0–100.0)
Monocytes Absolute: 1 10*3/uL (ref 0.1–1.0)
Monocytes Relative: 8 %
Neutro Abs: 9.1 10*3/uL — ABNORMAL HIGH (ref 1.7–7.7)
Neutrophils Relative %: 79 %
Platelets: 158 10*3/uL (ref 150–400)
RBC: 3.9 MIL/uL (ref 3.87–5.11)
RDW: 14.6 % (ref 11.5–15.5)
WBC: 11.7 10*3/uL — ABNORMAL HIGH (ref 4.0–10.5)
nRBC: 0 % (ref 0.0–0.2)

## 2021-01-15 LAB — URINALYSIS, ROUTINE W REFLEX MICROSCOPIC
Bilirubin Urine: NEGATIVE
Glucose, UA: NEGATIVE mg/dL
Hgb urine dipstick: NEGATIVE
Ketones, ur: NEGATIVE mg/dL
Nitrite: POSITIVE — AB
Specific Gravity, Urine: 1.018 (ref 1.005–1.030)
pH: 5.5 (ref 5.0–8.0)

## 2021-01-15 LAB — RESP PANEL BY RT-PCR (FLU A&B, COVID) ARPGX2
Influenza A by PCR: NEGATIVE
Influenza B by PCR: NEGATIVE
SARS Coronavirus 2 by RT PCR: NEGATIVE

## 2021-01-15 NOTE — ED Notes (Signed)
Stretcher Currently Locked in Lowest Position. Patient comfortable at this time with Call Ronda within reach. External Urinary Catheter Placed by Charlett Nose, EMT. Staff will continue to monitor.

## 2021-01-15 NOTE — ED Triage Notes (Signed)
Patient BIB [facility] from [facility] for UTI Symptoms and Cough.  Staff at [facility] states she has been having malodorous and dark urine for approximately 4-5 days along with a mildly productive cough that began 1-2 days PTA. Staff also states that cough is present in multiple residents at the facility. PCP has not been returning calls regarding recent symptoms per facility staff.  A&Ox1 per Baseline. No Neurological Changes. Follows Commands well during Triage.

## 2021-01-15 NOTE — ED Notes (Signed)
Patient currently attempting to provide urine sample at this time for analysis. Staff will reassess at a later time.

## 2021-01-16 DIAGNOSIS — N3000 Acute cystitis without hematuria: Secondary | ICD-10-CM | POA: Diagnosis not present

## 2021-01-16 MED ORDER — CEPHALEXIN 500 MG PO CAPS: 500.0000 mg | ORAL_CAPSULE | Freq: Four times a day (QID) | ORAL | 0 refills | Status: DC

## 2021-01-16 MED ORDER — SODIUM CHLORIDE 0.9 % IV SOLN
1.0000 g | Freq: Once | INTRAVENOUS | Status: AC
  Administered 2021-01-16: 1 g via INTRAVENOUS
  Filled 2021-01-16: qty 10

## 2021-01-16 MED ORDER — SODIUM CHLORIDE 0.9 % IV BOLUS
500.0000 mL | Freq: Once | INTRAVENOUS | Status: AC
  Administered 2021-01-16: 500 mL via INTRAVENOUS

## 2021-01-16 NOTE — ED Notes (Signed)
This RN presented the AVS utilizing Teachback Method. Patient verbalizes understanding of Discharge Instructions. Opportunity for Questioning and Answers were provided. Patient Discharged from ED in stretcher with [facility] to [facility].

## 2021-01-16 NOTE — ED Notes (Signed)
[facility] at Bedside.

## 2021-01-16 NOTE — Discharge Instructions (Addendum)
Urinalysis consistent with urinary tract infection.  Urine sent for culture.  Received Rocephin IV here and a prescription provided for Keflex for the next 7 days.  Would expect improvement.  Also for her cough and upper respiratory type symptoms chest x-ray was negative and COVID and influenza testing was negative.  Return for any new or worse symptoms.  Also some degree of renal insufficiency here tonight.  Would recommend having renal function rechecked in a few days.

## 2021-01-16 NOTE — ED Provider Notes (Signed)
[facility] EMERGENCY DEPT Provider Note   CSN: [id number] Arrival date & time: 01/15/21  1928     History Chief Complaint  Patient presents with   Urinary Tract Infection   Cough    Emily Waller is a 87 y.o. female.  Patient sent in from [facility].  Staff stated the patient had malodorous and dark urine for approximately 4 to 5 days with a mild productive cough that began about 1 to 2 days ago.  Patient brought in by EMS.  Patient's mental status is apparently at baseline.  Past medical history is significant for hypertension hyperlipidemia.  History of renal insufficiency diabetes.      Past Medical History:  Diagnosis Date   Anxiety    Depression    Diabetes mellitus without complication ([place])    Hyperlipidemia    Hypertension    Renal disorder    Kidney disease   Vitamin D deficiency     Patient Active Problem List   Diagnosis Date Noted   Symptomatic bradycardia 07/07/2018   Renal insufficiency 07/07/2018   Diet-controlled diabetes mellitus ([employer]) 07/07/2018   Itching 09/12/2015   Diabetes ([place]) 09/12/2015   HTN (hypertension) 09/12/2015   Depression 09/12/2015    History reviewed. No pertinent surgical history.   OB History   No obstetric history on file.     Family History  Problem Relation Age of Onset   Hyperlipidemia Mother    Hypertension Mother    Cancer Father    Heart disease Father    CAD Maternal Grandmother    CAD Maternal Grandfather    Lung cancer Sister     Social History   Tobacco Use   Smoking status: Former    Pack years: 0.00    Types: Cigarettes    Quit date: 10/02/1969    Years since quitting: 51.3   Smokeless tobacco: Never  Vaping Use   Vaping Use: Never used  Substance Use Topics   Alcohol use: No    Alcohol/week: 0.0 standard drinks   Drug use: No    Home Medications Prior to Admission medications   Medication Sig Start Date End Date Taking? Authorizing Provider  cephALEXin  (KEFLEX) 500 MG capsule Take 1 capsule (500 mg total) by mouth 4 (four) times daily. 01/16/21  Yes Fredia Sorrow, MD  allopurinol (ZYLOPRIM) 300 MG tablet Take 300 mg by mouth daily.    [provider]  amLODipine (NORVASC) 5 MG tablet Take 5 mg by mouth daily.    [provider]  atorvastatin (LIPITOR) 10 MG tablet Take 10 mg by mouth daily.    [provider]  cycloSPORINE (RESTASIS) 0.05 % ophthalmic emulsion Place 1 drop into both eyes 2 (two) times daily.    [provider]  ezetimibe (ZETIA) 10 MG tablet Take 10 mg by mouth daily.    [provider]  Omega 3 1200 MG CAPS Take 1,200 mg by mouth daily.    [provider]  oxybutynin (DITROPAN-XL) 10 MG 24 hr tablet Take 10 mg by mouth daily.    [provider]  pantoprazole (PROTONIX) 40 MG tablet Take 40 mg by mouth daily.    [provider]  sertraline (ZOLOFT) 50 MG tablet Take 1 tablet (50 mg total) by mouth daily. 07/11/18   Rai, Ripudeep Raliegh Ip, MD  Turmeric 500 MG CAPS Take 500 mg by mouth daily.    [provider]    Allergies    Colcrys [colchicine] and Lovastatin  Review of Systems   Review of Systems  Constitutional:  Negative for chills and fever.  HENT:  Positive for congestion. Negative for ear pain and sore throat.   Eyes:  Negative for pain and visual disturbance.  Respiratory:  Positive for cough. Negative for shortness of breath.   Cardiovascular:  Negative for chest pain and palpitations.  Gastrointestinal:  Negative for abdominal pain and vomiting.  Genitourinary:  Negative for dysuria and hematuria.  Musculoskeletal:  Negative for arthralgias and back pain.  Skin:  Negative for color change and rash.  Neurological:  Negative for seizures and syncope.  All other systems reviewed and are negative.  Physical Exam Updated Vital Signs BP (!) 151/62 (BP Location: Right Arm)   Pulse 72   Temp 99.1 F (37.3 C) (Oral)   Resp 14   Ht  1.727 m (5\' 8" )   Wt 64.3 kg   SpO2 97%   BMI 21.55 kg/m   Physical Exam Vitals and nursing note reviewed.  Constitutional:      General: She is not in acute distress.    Appearance: She is well-developed.  HENT:     Head: Normocephalic and atraumatic.     Mouth/Throat:     Mouth: Mucous membranes are moist.  Eyes:     Extraocular Movements: Extraocular movements intact.     Conjunctiva/sclera: Conjunctivae normal.     Pupils: Pupils are equal, round, and reactive to light.  Cardiovascular:     Rate and Rhythm: Normal rate and regular rhythm.     Heart sounds: No murmur heard. Pulmonary:     Effort: Pulmonary effort is normal. No respiratory distress.     Breath sounds: Normal breath sounds. No wheezing, rhonchi or rales.  Chest:     Chest wall: No tenderness.  Abdominal:     General: There is no distension.     Palpations: Abdomen is soft.     Tenderness: There is no abdominal tenderness. There is no guarding.  Musculoskeletal:        General: No swelling.     Cervical back: Neck supple.  Skin:    General: Skin is warm and dry.  Neurological:     Mental Status: She is alert. Mental status is at baseline.    ED Results / Procedures / Treatments   Labs (all labs ordered are listed, but only abnormal results are displayed) Labs Reviewed  URINALYSIS, ROUTINE W REFLEX MICROSCOPIC - Abnormal; Notable for the following components:      Result Value   Protein, ur TRACE (*)    Nitrite POSITIVE (*)    Leukocytes,Ua LARGE (*)    Bacteria, UA RARE (*)    All other components within normal limits  CBC WITH DIFFERENTIAL/PLATELET - Abnormal; Notable for the following components:   WBC 11.7 (*)    Neutro Abs 9.1 (*)    All other components within normal limits  COMPREHENSIVE METABOLIC PANEL - Abnormal; Notable for the following components:   Glucose, Bld 218 (*)    BUN 42 (*)    Creatinine, Ser 1.40 (*)    AST 11 (*)    GFR, Estimated 36 (*)    All other components  within normal limits  RESP PANEL BY RT-PCR (FLU A&B, COVID) ARPGX2  URINE CULTURE    EKG None  Radiology DG Chest Port 1 View  Result Date: 01/15/2021 CLINICAL DATA:  Cough EXAM: PORTABLE CHEST 1 VIEW COMPARISON:  10/01/2018 FINDINGS: Mild peribronchial thickening. Heart is normal size. No  confluent opacities or effusions. No acute bony abnormality. IMPRESSION: Mild bronchitic changes. Electronically Signed   By: Rolm Baptise M.D.   On: 01/15/2021 20:04    Procedures Procedures   Medications Ordered in ED Medications  cefTRIAXone (ROCEPHIN) 1 g in sodium chloride 0.9 % 100 mL IVPB (has no administration in time range)  sodium chloride 0.9 % bolus 500 mL (has no administration in time range)    ED Course  I have reviewed the triage vital signs and the nursing notes.  Pertinent labs & imaging results that were available during my care of the patient were reviewed by me and considered in my medical decision making (see chart for details).    MDM Rules/Calculators/A&P                          Patient's work-up from a respiratory standpoint chest x-ray negative except for some bronchitic changes.Marland Kitchen  COVID testing negative.  The other concern was possible urinary tract infection.  Urinalysis is consistent with urinary tract infection without significant hematuria.  Urine sent for culture.  Patient will be given 1 g Rocephin 500 cc bolus of fluid.  Patient's basic metabolic panel shows evidence of renal insufficiency with BUN of 42 and creatinine of 1.40 for a GFR 36.  Liver function test without significant abnormalities.  Blood sugar 218 but no evidence of metabolic acidosis.  Patient nontoxic no acute distress here.  Patient will receive IV Rocephin here then be discharged back to nursing facility with a prescription for Keflex.  Recommend they check her renal function again in a few days.  Patient's COVID testing was negative and influenza testing was negative. Final Clinical  Impression(s) / ED Diagnoses Final diagnoses:  Acute cystitis without hematuria    Rx / DC Orders ED Discharge Orders          Ordered    cephALEXin (KEFLEX) 500 MG capsule  4 times daily        01/16/21 0010             Fredia Sorrow, MD 01/16/21 [phone]

## 2021-01-16 NOTE — ED Notes (Signed)
This RN attempted to give report to the patients facility at this time with no response. Appropriate Voicemail Message was left. Awaiting for response. RN will continue to monitor.

## 2021-01-16 NOTE — ED Notes (Signed)
Care Handoff/Report given to PTAR at the Bedside.

## 2021-01-16 NOTE — ED Provider Notes (Signed)
[facility] EMERGENCY DEPT Provider Note   CSN: [id number] Arrival date & time: 01/15/21  1928     History Chief Complaint  Patient presents with   Urinary Tract Infection   Cough    Emily Waller is a 87 y.o. female.  Duplicate Note      Past Medical History:  Diagnosis Date   Anxiety    Depression    Diabetes mellitus without complication ([place])    Hyperlipidemia    Hypertension    Renal disorder    Kidney disease   Vitamin D deficiency     Patient Active Problem List   Diagnosis Date Noted   Symptomatic bradycardia 07/07/2018   Renal insufficiency 07/07/2018   Diet-controlled diabetes mellitus ([place]) 07/07/2018   Itching 09/12/2015   Diabetes ([place]) 09/12/2015   HTN (hypertension) 09/12/2015   Depression 09/12/2015    History reviewed. No pertinent surgical history.   OB History   No obstetric history on file.     Family History  Problem Relation Age of Onset   Hyperlipidemia Mother    Hypertension Mother    Cancer Father    Heart disease Father    CAD Maternal Grandmother    CAD Maternal Grandfather    Lung cancer Sister     Social History   Tobacco Use   Smoking status: Former    Pack years: 0.00    Types: Cigarettes    Quit date: 10/02/1969    Years since quitting: 51.3   Smokeless tobacco: Never  Vaping Use   Vaping Use: Never used  Substance Use Topics   Alcohol use: No    Alcohol/week: 0.0 standard drinks   Drug use: No    Home Medications Prior to Admission medications   Medication Sig Start Date End Date Taking? Authorizing Provider  cephALEXin (KEFLEX) 500 MG capsule Take 1 capsule (500 mg total) by mouth 4 (four) times daily. 01/16/21  Yes Fredia Sorrow, MD  allopurinol (ZYLOPRIM) 300 MG tablet Take 300 mg by mouth daily.    [provider]  amLODipine (NORVASC) 5 MG tablet Take 5 mg by mouth daily.    [provider]  atorvastatin (LIPITOR) 10 MG tablet Take 10 mg by mouth daily.     [provider]  cycloSPORINE (RESTASIS) 0.05 % ophthalmic emulsion Place 1 drop into both eyes 2 (two) times daily.    [provider]  ezetimibe (ZETIA) 10 MG tablet Take 10 mg by mouth daily.    [provider]  Omega 3 1200 MG CAPS Take 1,200 mg by mouth daily.    [provider]  oxybutynin (DITROPAN-XL) 10 MG 24 hr tablet Take 10 mg by mouth daily.    [provider]  pantoprazole (PROTONIX) 40 MG tablet Take 40 mg by mouth daily.    [provider]  sertraline (ZOLOFT) 50 MG tablet Take 1 tablet (50 mg total) by mouth daily. 07/11/18   Rai, Ripudeep Raliegh Ip, MD  Turmeric 500 MG CAPS Take 500 mg by mouth daily.    [provider]    Allergies    Colcrys [colchicine] and Lovastatin  Review of Systems   Review of Systems  Physical Exam Updated Vital Signs BP (!) 151/62 (BP Location: Right Arm)   Pulse 72   Temp 99.1 F (37.3 C) (Oral)   Resp 14   Ht 1.727 m (5\' 8" )   Wt 64.3 kg   SpO2 97%   BMI 21.55 kg/m   Physical Exam  ED Results / Procedures / Treatments   Labs (all labs ordered are listed, but only abnormal results are displayed) Labs Reviewed  URINALYSIS, ROUTINE W REFLEX MICROSCOPIC - Abnormal; Notable for the following components:      Result Value   Protein, ur TRACE (*)    Nitrite POSITIVE (*)    Leukocytes,Ua LARGE (*)    Bacteria, UA RARE (*)    All other components within normal limits  CBC WITH DIFFERENTIAL/PLATELET - Abnormal; Notable for the following components:   WBC 11.7 (*)    Neutro Abs 9.1 (*)    All other components within normal limits  COMPREHENSIVE METABOLIC PANEL - Abnormal; Notable for the following components:   Glucose, Bld 218 (*)    BUN 42 (*)    Creatinine, Ser 1.40 (*)    AST 11 (*)    GFR, Estimated 36 (*)    All other components within normal limits  RESP PANEL BY RT-PCR (FLU A&B, COVID) ARPGX2  URINE CULTURE    EKG None  Radiology DG Chest Port 1  View  Result Date: 01/15/2021 CLINICAL DATA:  Cough EXAM: PORTABLE CHEST 1 VIEW COMPARISON:  10/01/2018 FINDINGS: Mild peribronchial thickening. Heart is normal size. No confluent opacities or effusions. No acute bony abnormality. IMPRESSION: Mild bronchitic changes. Electronically Signed   By: Rolm Baptise M.D.   On: 01/15/2021 20:04    Procedures Procedures   Medications Ordered in ED Medications  cefTRIAXone (ROCEPHIN) 1 g in sodium chloride 0.9 % 100 mL IVPB (has no administration in time range)    ED Course  I have reviewed the triage vital signs and the nursing notes.  Pertinent labs & imaging results that were available during my care of the patient were reviewed by me and considered in my medical decision making (see chart for details).    MDM Rules/Calculators/A&P                           Final Clinical Impression(s) / ED Diagnoses Final diagnoses:  Acute cystitis without hematuria    Rx / DC Orders ED Discharge Orders          Ordered    cephALEXin (KEFLEX) 500 MG capsule  4 times daily        01/16/21 0010             Fredia Sorrow, MD 01/17/21 1342

## 2021-01-16 NOTE — ED Notes (Signed)
Called EMS [facility] EMERG[facility] for transportation at 118am

## 2021-01-16 NOTE — ED Notes (Signed)
Patient comfortable at this time awaiting for transport back to Facility. Call [facility] at Bedside.

## 2021-01-18 LAB — URINE CULTURE: Culture: >=100000 — AB

## 2021-01-19 ENCOUNTER — Telehealth: Payer: Self-pay | Admitting: Emergency Medicine

## 2021-01-19 NOTE — Telephone Encounter (Signed)
Post ED Visit - Positive Culture Follow-up  Culture report reviewed by antimicrobial stewardship pharmacist: [place] Team []  Elenor Quinones, Pharm.D. []  Heide Guile, Pharm.D., BCPS AQ-ID []  Parks Neptune, Pharm.D., BCPS []  Alycia Rossetti, Pharm.D., BCPS []  Clark, Pharm.D., BCPS, AAHIVP []  Legrand Como, Pharm.D., BCPS, AAHIVP []  Salome Arnt, PharmD, BCPS []  Johnnette Gourd, PharmD, BCPS []  Hughes Better, PharmD, BCPS [x]  Lorelei Pont, PharmD []  Laqueta Linden, PharmD, BCPS []  Albertina Parr, PharmD  [place] Team []  Leodis Sias, PharmD []  Lindell Spar, PharmD []  Royetta Asal, PharmD []  Graylin Shiver, Rph []  Rema Fendt) Glennon Mac, PharmD []  Arlyn Dunning, PharmD []  Netta Cedars, PharmD []  Dia Sitter, PharmD []  Leone Haven, PharmD []  Gretta Arab, PharmD []  Theodis Shove, PharmD []  Peggyann Juba, PharmD []  Reuel Boom, PharmD   Positive urine culture Treated with Cephalexin, organism sensitive to the same and no further patient follow-up is required at this time.  Milus Mallick 01/19/2021, 4:38 PM

## 2021-02-17 ENCOUNTER — Emergency Department (HOSPITAL_COMMUNITY): Payer: Medicare Other

## 2021-02-17 ENCOUNTER — Other Ambulatory Visit: Payer: Self-pay

## 2021-02-17 ENCOUNTER — Encounter (HOSPITAL_COMMUNITY): Payer: Self-pay | Admitting: Emergency Medicine

## 2021-02-17 ENCOUNTER — Emergency Department (HOSPITAL_COMMUNITY)
Admission: EM | Admit: 2021-02-17 | Discharge: 2021-02-17 | Disposition: A | Discharge status: Home / Self Care | Payer: Medicare Other | Attending: Emergency Medicine | Admitting: Emergency Medicine

## 2021-02-17 DIAGNOSIS — I10 Essential (primary) hypertension: Secondary | ICD-10-CM | POA: Insufficient documentation

## 2021-02-17 DIAGNOSIS — E119 Type 2 diabetes mellitus without complications: Secondary | ICD-10-CM | POA: Diagnosis not present

## 2021-02-17 DIAGNOSIS — Z87891 Personal history of nicotine dependence: Secondary | ICD-10-CM | POA: Insufficient documentation

## 2021-02-17 DIAGNOSIS — S42211A Unspecified displaced fracture of surgical neck of right humerus, initial encounter for closed fracture: Secondary | ICD-10-CM | POA: Diagnosis not present

## 2021-02-17 DIAGNOSIS — S4991XA Unspecified injury of right shoulder and upper arm, initial encounter: Secondary | ICD-10-CM | POA: Diagnosis present

## 2021-02-17 DIAGNOSIS — S0990XA Unspecified injury of head, initial encounter: Secondary | ICD-10-CM | POA: Diagnosis not present

## 2021-02-17 DIAGNOSIS — W06XXXA Fall from bed, initial encounter: Secondary | ICD-10-CM | POA: Diagnosis not present

## 2021-02-17 DIAGNOSIS — Z79899 Other long term (current) drug therapy: Secondary | ICD-10-CM | POA: Diagnosis not present

## 2021-02-17 MED ORDER — TRAMADOL HCL 50 MG PO TABS: 50.0000 mg | ORAL_TABLET | Freq: Four times a day (QID) | ORAL | 0 refills | Status: DC | PRN

## 2021-02-17 MED ORDER — FENTANYL CITRATE (PF) 100 MCG/2ML IJ SOLN
25.0000 ug | Freq: Once | INTRAMUSCULAR | Status: AC
  Administered 2021-02-17: 25 ug via INTRAVENOUS
  Filled 2021-02-17: qty 2

## 2021-02-17 NOTE — ED Notes (Signed)
Update given to pt daughter over the phone.

## 2021-02-17 NOTE — ED Notes (Signed)
[facility] called to transport pt back to [facility]

## 2021-02-17 NOTE — ED Notes (Signed)
Attempted X3 to call report to [facility]. Left a message.

## 2021-02-17 NOTE — ED Provider Notes (Signed)
[facility] DEPT Provider Note   CSN: [id number] Arrival date & time: 02/17/21  0008     History No chief complaint on file.   Emily Waller is a 87 y.o. female.  The history is provided by the patient.  Shoulder Injury This is a new problem. The current episode started less than 1 hour ago. The problem occurs constantly. The problem has not changed since onset.Pertinent negatives include no chest pain, no abdominal pain, no headaches and no shortness of breath. Nothing aggravates the symptoms. Nothing relieves the symptoms. She has tried nothing for the symptoms.  Patient from memory care unit.  [place] out of bed onto right shoulder.     Past Medical History:  Diagnosis Date   Anxiety    Depression    Diabetes mellitus without complication ([place])    Hyperlipidemia    Hypertension    Renal disorder    Kidney disease   Vitamin D deficiency     Patient Active Problem List   Diagnosis Date Noted   Symptomatic bradycardia 07/07/2018   Renal insufficiency 07/07/2018   Diet-controlled diabetes mellitus ([place]) 07/07/2018   Itching 09/12/2015   Diabetes ([place]) 09/12/2015   HTN (hypertension) 09/12/2015   Depression 09/12/2015    History reviewed. No pertinent surgical history.   OB History   No obstetric history on file.     Family History  Problem Relation Age of Onset   Hyperlipidemia Mother    Hypertension Mother    Cancer Father    Heart disease Father    CAD Maternal Grandmother    CAD Maternal Grandfather    Lung cancer Sister     Social History   Tobacco Use   Smoking status: Former    Types: Cigarettes    Quit date: 10/02/1969    Years since quitting: 51.4   Smokeless tobacco: Never  Vaping Use   Vaping Use: Never used  Substance Use Topics   Alcohol use: No    Alcohol/week: 0.0 standard drinks   Drug use: No    Home Medications Prior to Admission medications   Medication Sig Start Date End Date Taking?  Authorizing Provider  traMADol (ULTRAM) 50 MG tablet Take 1 tablet (50 mg total) by mouth every 6 (six) hours as needed. 02/17/21  Yes Amsi Grimley, MD  allopurinol (ZYLOPRIM) 300 MG tablet Take 300 mg by mouth daily.    [provider]  amLODipine (NORVASC) 5 MG tablet Take 5 mg by mouth daily.    [provider]  atorvastatin (LIPITOR) 10 MG tablet Take 10 mg by mouth daily.    [provider]  cephALEXin (KEFLEX) 500 MG capsule Take 1 capsule (500 mg total) by mouth 4 (four) times daily. 01/16/21   Fredia Sorrow, MD  cycloSPORINE (RESTASIS) 0.05 % ophthalmic emulsion Place 1 drop into both eyes 2 (two) times daily.    [provider]  ezetimibe (ZETIA) 10 MG tablet Take 10 mg by mouth daily.    [provider]  Omega 3 1200 MG CAPS Take 1,200 mg by mouth daily.    [provider]  oxybutynin (DITROPAN-XL) 10 MG 24 hr tablet Take 10 mg by mouth daily.    [provider]  pantoprazole (PROTONIX) 40 MG tablet Take 40 mg by mouth daily.    [provider]  sertraline (ZOLOFT) 50 MG tablet Take 1 tablet (50 mg total) by mouth daily. 07/11/18   Mendel Corning, MD  Turmeric 500 MG  CAPS Take 500 mg by mouth daily.    [provider]    Allergies    Colcrys [colchicine] and Lovastatin  Review of Systems   Review of Systems  Constitutional:  Negative for fever.  HENT:  Negative for facial swelling.   Eyes:  Negative for redness.  Respiratory:  Negative for shortness of breath.   Cardiovascular:  Negative for chest pain.  Gastrointestinal:  Negative for abdominal pain.  Genitourinary:  Negative for difficulty urinating.  Musculoskeletal:  Positive for arthralgias. Negative for neck stiffness.  Skin:  Negative for rash.  Neurological:  Negative for headaches.  Psychiatric/Behavioral:  Negative for agitation.   All other systems reviewed and are negative.  Physical Exam Updated Vital Signs BP (!) 176/72    Pulse 83   Temp 98.6 F (37 C) (Oral)   Resp 16   SpO2 99%   Physical Exam Vitals and nursing note reviewed.  Constitutional:      General: She is not in acute distress.    Appearance: Normal appearance.  HENT:     Head: Normocephalic and atraumatic.     Nose: Nose normal.  Eyes:     Conjunctiva/sclera: Conjunctivae normal.     Pupils: Pupils are equal, round, and reactive to light.  Cardiovascular:     Rate and Rhythm: Normal rate and regular rhythm.     Pulses: Normal pulses.     Heart sounds: Normal heart sounds.  Pulmonary:     Effort: Pulmonary effort is normal.     Breath sounds: Normal breath sounds.  Abdominal:     General: Abdomen is flat. Bowel sounds are normal.     Palpations: Abdomen is soft.     Tenderness: There is no abdominal tenderness. There is no guarding.  Musculoskeletal:        General: Normal range of motion.     Cervical back: Normal range of motion and neck supple. No rigidity or tenderness.  Skin:    General: Skin is warm and dry.     Capillary Refill: Capillary refill takes less than 2 seconds.  Neurological:     General: No focal deficit present.     Mental Status: She is alert.     Deep Tendon Reflexes: Reflexes normal.  Psychiatric:        Mood and Affect: Mood normal.        Behavior: Behavior normal.    ED Results / Procedures / Treatments   Labs (all labs ordered are listed, but only abnormal results are displayed) Labs Reviewed - No data to display  EKG None  Radiology DG Clavicle Right  Result Date: 02/17/2021 CLINICAL DATA:  Fall.  Right shoulder pain EXAM: RIGHT SHOULDER - 2+ VIEW; RIGHT CLAVICLE - 2+ VIEWS COMPARISON:  None. FINDINGS: Displaced humeral neck fracture. No right shoulder dislocation. No clavicular fracture or dislocation. No severe degenerative changes. Poor visualization of soft tissues. IMPRESSION: 1. Displaced humeral neck fracture with limited evaluation on this two view radiograph. 2. No right shoulder  dislocation. 3. No right clavicular fracture. Electronically Signed   By: Iven Finn M.D.   On: 02/17/2021 00:56   DG Shoulder Right  Result Date: 02/17/2021 CLINICAL DATA:  Fall.  Right shoulder pain EXAM: RIGHT SHOULDER - 2+ VIEW; RIGHT CLAVICLE - 2+ VIEWS COMPARISON:  None. FINDINGS: Displaced humeral neck fracture. No right shoulder dislocation. No clavicular fracture or dislocation. No severe degenerative changes. Poor visualization of soft tissues. IMPRESSION: 1. Displaced humeral neck fracture with limited  evaluation on this two view radiograph. 2. No right shoulder dislocation. 3. No right clavicular fracture. Electronically Signed   By: Iven Finn M.D.   On: 02/17/2021 00:56   CT Head Wo Contrast  Result Date: 02/17/2021 CLINICAL DATA:  Fall out of bed.  Facial trauma. EXAM: CT HEAD WITHOUT CONTRAST TECHNIQUE: Contiguous axial images were obtained from the base of the skull through the vertex without intravenous contrast. COMPARISON:  Head CT 08/22/2019 FINDINGS: Brain: No acute hemorrhage. No subdural or extra-axial collection. Stable degree of atrophy and ventriculomegaly likely due to central atrophy. Remote lacunar infarcts in the right basal ganglia and right cerebellum. No evidence of acute ischemia. No midline shift or mass effect. Vascular: Atherosclerosis of skullbase vasculature without hyperdense vessel or abnormal calcification. Skull: No fracture or focal lesion. Sinuses/Orbits: Paranasal sinuses are clear. Minimal opacification of lower left mastoid air cells, slightly progressed from prior. The visualized orbits are unremarkable. Bilateral cataract resection. Other: None. IMPRESSION: 1. No acute intracranial abnormality. No skull fracture. 2. Stable atrophy and chronic small vessel ischemia. Remote lacunar infarcts in the right basal ganglia and right cerebellum. Electronically Signed   By: Keith Rake M.D.   On: 02/17/2021 01:02   CT Cervical Spine Wo  Contrast  Result Date: 02/17/2021 CLINICAL DATA:  Facial trauma.  Fall out of bed. EXAM: CT CERVICAL SPINE WITHOUT CONTRAST TECHNIQUE: Multidetector CT imaging of the cervical spine was performed without intravenous contrast. Multiplanar CT image reconstructions were also generated. COMPARISON:  CT cervical spine 06/18/2017 FINDINGS: Alignment: Straightening of normal lordosis. Grade 1 anterolisthesis of C3 on C4 stable from prior exam. Trace anterolisthesis of C4 on C5. Skull base and vertebrae: Prominent C1-C2 arthropathy. No acute fracture. Dens and skull base are intact. Congenital T1-T2 segmentation anomaly. Soft tissues and spinal canal: No prevertebral fluid or swelling. No visible canal hematoma. Disc levels: Diffuse degenerative disc disease, with disc space narrowing and endplate spurring most prominent at C4-C5, C5-C6, and C6-C7. Multilevel facet hypertrophy. Upper chest: Nonacute. Other: None. IMPRESSION: 1. No acute fracture or subluxation of the cervical spine. 2. Multilevel degenerative disc disease and facet hypertrophy. Electronically Signed   By: Keith Rake M.D.   On: 02/17/2021 01:08   DG Humerus Right  Result Date: 02/17/2021 CLINICAL DATA:  Proximal humerus fracture. EXAM: RIGHT HUMERUS - 2+ VIEW COMPARISON:  Shoulder radiograph earlier today. FINDINGS: Comminuted and displaced proximal humerus fracture with dominant fracture plane through the surgical neck. There is also a 1 cm displacement of fracture fragments. Limited assessment for intra-articular involvement on provided views. The distal humerus is intact. IMPRESSION: Comminuted and displaced proximal humerus fracture with dominant fracture plane through the surgical neck. Fracture displacement is less than 1 cm. Electronically Signed   By: Keith Rake M.D.   On: 02/17/2021 01:36    Procedures Procedures   Medications Ordered in ED Medications - No data to display  ED Course  I have reviewed the triage vital signs  and the nursing notes.  Pertinent labs & imaging results that were available during my care of the patient were reviewed by me and considered in my medical decision making (see chart for details).    Treated with immobilizer and close follow up with orthopedics this week.  Stable for discharge with close follow up   LARAH SHOWERS was evaluated in Emergency Department on 02/17/2021 for the symptoms described in the history of present illness. She was evaluated in the context of the global COVID-19 pandemic, which necessitated  consideration that the patient might be at risk for infection with the SARS-CoV-2 virus that causes COVID-19. Institutional protocols and algorithms that pertain to the evaluation of patients at risk for COVID-19 are in a state of rapid change based on information released by regulatory bodies including the CDC and federal and state organizations. These policies and algorithms were followed during the patient's care in the ED.]   Final Clinical Impression(s) / ED Diagnoses Final diagnoses:  Closed displaced fracture of surgical neck of right humerus, unspecified fracture morphology, initial encounter    Return for intractable cough, coughing up blood, fevers > 100.4 unrelieved by medication, shortness of breath, intractable vomiting, chest pain, shortness of breath, weakness, numbness, changes in speech, facial asymmetry, abdominal pain, passing out, Inability to tolerate liquids or food, cough, altered mental status or any concerns. No signs of systemic illness or infection. The patient is nontoxic-appearing on exam and vital signs are within normal limits. I have reviewed the triage vital signs and the nursing notes. Pertinent labs & imaging results that were available during my care of the patient were reviewed by me and considered in my medical decision making (see chart for details). After history, exam, and medical workup I feel the patient has been appropriately medically  screened and is safe for discharge home. Pertinent diagnoses were discussed with the patient. Patient was given return precautions Rx / DC Orders ED Discharge Orders          Ordered    traMADol (ULTRAM) 50 MG tablet  Every 6 hours PRN        02/17/21 0146             Kimanh Templeman, MD 02/17/21 0200

## 2021-02-17 NOTE — ED Triage Notes (Signed)
[facility] from [facility]. Pt fell out of bed. No LOC. Anterior deformity to right shoulder.

## 2021-11-04 ENCOUNTER — Emergency Department (HOSPITAL_COMMUNITY): Payer: Medicare Other

## 2021-11-04 ENCOUNTER — Other Ambulatory Visit: Payer: Self-pay

## 2021-11-04 ENCOUNTER — Inpatient Hospital Stay (HOSPITAL_COMMUNITY)
Admission: EM | Admit: 2021-11-04 | Discharge: 2021-11-06 | DRG: 309 | Disposition: A | Discharge status: Skilled Nursing Facility | Payer: Medicare Other | Source: Skilled Nursing Facility | Attending: Family Medicine | Admitting: Family Medicine

## 2021-11-04 DIAGNOSIS — I4510 Unspecified right bundle-branch block: Secondary | ICD-10-CM | POA: Diagnosis present

## 2021-11-04 DIAGNOSIS — F32A Depression, unspecified: Secondary | ICD-10-CM | POA: Diagnosis present

## 2021-11-04 DIAGNOSIS — Z66 Do not resuscitate: Secondary | ICD-10-CM | POA: Diagnosis present

## 2021-11-04 DIAGNOSIS — I471 Supraventricular tachycardia: Secondary | ICD-10-CM | POA: Diagnosis present

## 2021-11-04 DIAGNOSIS — Z8249 Family history of ischemic heart disease and other diseases of the circulatory system: Secondary | ICD-10-CM

## 2021-11-04 DIAGNOSIS — R7989 Other specified abnormal findings of blood chemistry: Secondary | ICD-10-CM

## 2021-11-04 DIAGNOSIS — I4891 Unspecified atrial fibrillation: Secondary | ICD-10-CM | POA: Diagnosis not present

## 2021-11-04 DIAGNOSIS — E1165 Type 2 diabetes mellitus with hyperglycemia: Secondary | ICD-10-CM | POA: Diagnosis not present

## 2021-11-04 DIAGNOSIS — I4892 Unspecified atrial flutter: Secondary | ICD-10-CM | POA: Diagnosis not present

## 2021-11-04 DIAGNOSIS — I129 Hypertensive chronic kidney disease with stage 1 through stage 4 chronic kidney disease, or unspecified chronic kidney disease: Secondary | ICD-10-CM | POA: Diagnosis present

## 2021-11-04 DIAGNOSIS — G309 Alzheimer's disease, unspecified: Secondary | ICD-10-CM | POA: Diagnosis present

## 2021-11-04 DIAGNOSIS — E785 Hyperlipidemia, unspecified: Secondary | ICD-10-CM | POA: Diagnosis present

## 2021-11-04 DIAGNOSIS — Z801 Family history of malignant neoplasm of trachea, bronchus and lung: Secondary | ICD-10-CM

## 2021-11-04 DIAGNOSIS — E559 Vitamin D deficiency, unspecified: Secondary | ICD-10-CM | POA: Diagnosis present

## 2021-11-04 DIAGNOSIS — Z888 Allergy status to other drugs, medicaments and biological substances status: Secondary | ICD-10-CM

## 2021-11-04 DIAGNOSIS — E1122 Type 2 diabetes mellitus with diabetic chronic kidney disease: Secondary | ICD-10-CM | POA: Diagnosis present

## 2021-11-04 DIAGNOSIS — G8929 Other chronic pain: Secondary | ICD-10-CM | POA: Diagnosis present

## 2021-11-04 DIAGNOSIS — I248 Other forms of acute ischemic heart disease: Secondary | ICD-10-CM | POA: Diagnosis present

## 2021-11-04 DIAGNOSIS — I10 Essential (primary) hypertension: Secondary | ICD-10-CM | POA: Diagnosis not present

## 2021-11-04 DIAGNOSIS — Z83438 Family history of other disorder of lipoprotein metabolism and other lipidemia: Secondary | ICD-10-CM

## 2021-11-04 DIAGNOSIS — N390 Urinary tract infection, site not specified: Secondary | ICD-10-CM | POA: Diagnosis present

## 2021-11-04 DIAGNOSIS — Z79899 Other long term (current) drug therapy: Secondary | ICD-10-CM

## 2021-11-04 DIAGNOSIS — M109 Gout, unspecified: Secondary | ICD-10-CM | POA: Diagnosis present

## 2021-11-04 DIAGNOSIS — Z87891 Personal history of nicotine dependence: Secondary | ICD-10-CM

## 2021-11-04 DIAGNOSIS — F0284 Dementia in other diseases classified elsewhere, unspecified severity, with anxiety: Secondary | ICD-10-CM | POA: Diagnosis present

## 2021-11-04 DIAGNOSIS — N1832 Chronic kidney disease, stage 3b: Secondary | ICD-10-CM | POA: Diagnosis present

## 2021-11-04 DIAGNOSIS — Z20822 Contact with and (suspected) exposure to covid-19: Secondary | ICD-10-CM | POA: Diagnosis present

## 2021-11-04 DIAGNOSIS — I4821 Permanent atrial fibrillation: Secondary | ICD-10-CM | POA: Diagnosis present

## 2021-11-04 DIAGNOSIS — F0283 Dementia in other diseases classified elsewhere, unspecified severity, with mood disturbance: Secondary | ICD-10-CM | POA: Diagnosis present

## 2021-11-04 LAB — RESP PANEL BY RT-PCR (FLU A&B, COVID) ARPGX2
Influenza A by PCR: NEGATIVE
Influenza B by PCR: NEGATIVE
SARS Coronavirus 2 by RT PCR: NEGATIVE

## 2021-11-04 LAB — BASIC METABOLIC PANEL
Anion gap: 8 (ref 5–15)
Anion gap: 10 (ref 5–15)
BUN: 44 mg/dL — ABNORMAL HIGH (ref 8–23)
BUN: 39 mg/dL — ABNORMAL HIGH (ref 8–23)
CO2: 24 mmol/L (ref 22–32)
CO2: 20 mmol/L — ABNORMAL LOW (ref 22–32)
Calcium: 8.7 mg/dL — ABNORMAL LOW (ref 8.9–10.3)
Calcium: 8.4 mg/dL — ABNORMAL LOW (ref 8.9–10.3)
Chloride: 111 mmol/L (ref 98–111)
Chloride: 108 mmol/L (ref 98–111)
Creatinine, Ser: 1.54 mg/dL — ABNORMAL HIGH (ref 0.44–1.00)
Creatinine, Ser: 1.36 mg/dL — ABNORMAL HIGH (ref 0.44–1.00)
GFR, Estimated: 32 mL/min — ABNORMAL LOW (ref >60)
GFR, Estimated: 37 mL/min — ABNORMAL LOW (ref >60)
Glucose, Bld: 186 mg/dL — ABNORMAL HIGH (ref 70–99)
Glucose, Bld: 300 mg/dL — ABNORMAL HIGH (ref 70–99)
Potassium: 4.3 mmol/L (ref 3.5–5.1)
Potassium: 3.9 mmol/L (ref 3.5–5.1)
Sodium: 143 mmol/L (ref 135–145)
Sodium: 138 mmol/L (ref 135–145)

## 2021-11-04 LAB — TROPONIN I (HIGH SENSITIVITY)
Troponin I (High Sensitivity): 24 ng/L — ABNORMAL HIGH (ref <18)
Troponin I (High Sensitivity): 22 ng/L — ABNORMAL HIGH (ref <18)

## 2021-11-04 LAB — CBC
HCT: 44.4 % (ref 36.0–46.0)
Hemoglobin: 14 g/dL (ref 12.0–15.0)
MCH: 31.5 pg (ref 26.0–34.0)
MCHC: 31.5 g/dL (ref 30.0–36.0)
MCV: 99.8 fL (ref 80.0–100.0)
Platelets: 190 10*3/uL (ref 150–400)
RBC: 4.45 MIL/uL (ref 3.87–5.11)
RDW: 12.9 % (ref 11.5–15.5)
WBC: 10.8 10*3/uL — ABNORMAL HIGH (ref 4.0–10.5)
nRBC: 0 % (ref 0.0–0.2)

## 2021-11-04 LAB — PROTIME-INR
INR: 1 (ref 0.8–1.2)
INR: 1.1 (ref 0.8–1.2)
Prothrombin Time: 13.4 seconds (ref 11.4–15.2)
Prothrombin Time: 14.1 seconds (ref 11.4–15.2)

## 2021-11-04 LAB — TSH: TSH: 0.849 u[IU]/mL (ref 0.350–4.500)

## 2021-11-04 LAB — MAGNESIUM: Magnesium: 2 mg/dL (ref 1.7–2.4)

## 2021-11-04 MED ORDER — ALLOPURINOL 100 MG PO TABS
100.0000 mg | ORAL_TABLET | Freq: Every day | ORAL | Status: DC
  Administered 2021-11-05: 100 mg via ORAL
  Filled 2021-11-04: qty 1

## 2021-11-04 MED ORDER — MIRTAZAPINE 7.5 MG PO TABS
7.5000 mg | ORAL_TABLET | Freq: Every day | ORAL | Status: DC
  Administered 2021-11-04 – 2021-11-05 (×2): 7.5 mg via ORAL
  Filled 2021-11-04 (×4): qty 1

## 2021-11-04 MED ORDER — DILTIAZEM HCL-DEXTROSE 125-5 MG/125ML-% IV SOLN (PREMIX)
5.0000 mg/h | INTRAVENOUS | Status: DC
  Administered 2021-11-04: 15 mg/h via INTRAVENOUS
  Administered 2021-11-04: 5 mg/h via INTRAVENOUS
  Administered 2021-11-04: 7.5 mg/h via INTRAVENOUS
  Administered 2021-11-05: 5 mg/h via INTRAVENOUS
  Filled 2021-11-04 (×3): qty 125

## 2021-11-04 MED ORDER — METOPROLOL TARTRATE 25 MG PO TABS
25.0000 mg | ORAL_TABLET | Freq: Two times a day (BID) | ORAL | Status: DC
Start: 2021-11-04 — End: 2021-11-05
  Administered 2021-11-04: 25 mg via ORAL
  Filled 2021-11-04: qty 1

## 2021-11-04 MED ORDER — LACTATED RINGERS IV BOLUS
500.0000 mL | Freq: Once | INTRAVENOUS | Status: AC
  Administered 2021-11-04: 500 mL via INTRAVENOUS

## 2021-11-04 MED ORDER — DILTIAZEM LOAD VIA INFUSION
10.0000 mg | Freq: Once | INTRAVENOUS | Status: AC
  Administered 2021-11-04: 10 mg via INTRAVENOUS
  Filled 2021-11-04: qty 10

## 2021-11-04 MED ORDER — APIXABAN 2.5 MG PO TABS
2.5000 mg | ORAL_TABLET | Freq: Two times a day (BID) | ORAL | Status: DC
Start: 2021-11-04 — End: 2021-11-06
  Administered 2021-11-04 – 2021-11-05 (×3): 2.5 mg via ORAL
  Filled 2021-11-04 (×5): qty 1

## 2021-11-04 MED ORDER — TRAMADOL HCL 50 MG PO TABS: 50.0000 mg | ORAL_TABLET | Freq: Two times a day (BID) | ORAL | Status: DC | PRN

## 2021-11-04 MED ORDER — PANTOPRAZOLE SODIUM 40 MG PO TBEC
40.0000 mg | DELAYED_RELEASE_TABLET | Freq: Every day | ORAL | Status: DC
  Administered 2021-11-05: 40 mg via ORAL
  Filled 2021-11-04: qty 1

## 2021-11-04 MED ORDER — HEPARIN (PORCINE) 25000 UT/250ML-% IV SOLN
1000.0000 [IU]/h | INTRAVENOUS | Status: DC
  Administered 2021-11-04: 1000 [IU]/h via INTRAVENOUS
  Filled 2021-11-04: qty 250

## 2021-11-04 MED ORDER — NITROFURANTOIN MONOHYD MACRO 100 MG PO CAPS
100.0000 mg | ORAL_CAPSULE | Freq: Two times a day (BID) | ORAL | Status: DC
Start: 2021-11-04 — End: 2021-11-04

## 2021-11-04 MED ORDER — ATORVASTATIN CALCIUM 10 MG PO TABS
10.0000 mg | ORAL_TABLET | Freq: Every day | ORAL | Status: DC
  Administered 2021-11-04 – 2021-11-05 (×2): 10 mg via ORAL
  Filled 2021-11-04 (×2): qty 1

## 2021-11-04 MED ORDER — SODIUM CHLORIDE 0.9 % IV SOLN: INTRAVENOUS | Status: DC

## 2021-11-04 NOTE — Consult Note (Signed)
?Cardiology Consultation:  ? ?Patient ID: Emily Waller ?MRN: [id number]; DOB: 01-04-1928 ? ?Admit date: 11/04/2021 ?Date of Consult: 11/04/2021 ? ?Primary Care Provider: Patient, No Pcp Per (Inactive) ?[facility] Cardiologist: None  ?[facility] Electrophysiologist:  None  ? ? ?Patient Profile:  ? ?Emily Waller is a 88 y.o. female with a hx of anxiety and depression, diabetes, hyperlipidemia, hypertension who is being seen today for the evaluation of new onset atrial flutter at the request of Emily Pick, MD. ? ?History of Present Illness:  ? ?Emily Waller is a 88 year old female with a history of anxiety, depression, diabetes, hyperlipidemia, hypertension who was  in her usual state of health until today when she had a physical done and was found to have a heart rate in the 120s.  Her skilled nursing facility at [facility] called EMS for transport for further evaluation in the emergency room.  On arrival of EMS patient was sleeping at the dining room table.  In ER blood pressure 131/75, heart rate 142 bpm, O2 saturation 94% on room air.   Labs showed sodium 143, potassium 4.3, serum creatinine 1.54, magnesium 2, hemoglobin 14, WBC 10.8.  hstroponin mildly elevated at 24>22.  EKG on admission demonstrated atrial flutter at 142 bpm with right bundle branch block.  2D echo in 2019 showed normal LV function with grade 2 diastolic dysfunction.  She denies any chest pain, shortness of breath, PND, orthopnea, lower extremity edema or palpitations.  She is unaware of her tachycardia.  Cardiology is now asked to consult. ? ? ?Past Medical History:  ?Diagnosis Date  ? Anxiety   ? Depression   ? Diabetes mellitus without complication ([address])   ? Hyperlipidemia   ? Hypertension   ? Renal disorder   ? Kidney disease  ? Vitamin D deficiency   ? ? ?No past surgical history on file.  ? ?Home Medications:  ?Prior to Admission medications   ?Medication Sig Start Date End Date Taking? Authorizing Provider  ?acetaminophen  (TYLENOL) 325 MG tablet Take 650 mg by mouth 2 (two) times daily.   Yes [provider]  ?allopurinol (ZYLOPRIM) 100 MG tablet Take 100 mg by mouth daily.   Yes [provider]  ?amLODipine (NORVASC) 5 MG tablet Take 5 mg by mouth daily.   Yes [provider]  ?atorvastatin (LIPITOR) 10 MG tablet Take 10 mg by mouth at bedtime.   Yes [provider]  ?mirtazapine (REMERON) 7.5 MG tablet Take 7.5 mg by mouth at bedtime.   Yes [provider]  ?nitrofurantoin, macrocrystal-monohydrate, (MACROBID) 100 MG capsule Take 100 mg by mouth 2 (two) times daily. For 5 days   Yes [provider]  ?nystatin ointment (MYCOSTATIN) Apply 1 application. topically 2 (two) times daily. Left Breast   Yes [provider]  ?pantoprazole (PROTONIX) 40 MG tablet Take 40 mg by mouth daily.   Yes [provider]  ?traMADol (ULTRAM) 50 MG tablet Take 1 tablet (50 mg total) by mouth every 6 (six) hours as needed. ?Patient taking differently: Take 50 mg by mouth every 6 (six) hours as needed for moderate pain. 02/17/21  Yes Palumbo, April, MD  ?Turmeric 500 MG CAPS Take 500 mg by mouth daily.   Yes [provider]  ?Vitamin D, Ergocalciferol, (DRISDOL) 1.25 MG (50000 UNIT) CAPS capsule Take 50,000 Units by mouth every Tuesday.   Yes [provider]  ?sertraline (ZOLOFT) 50 MG tablet Take 1 tablet (50 mg total) by mouth daily. ?Patient  not taking: Reported on 11/04/2021 07/11/18   Emily Corning, MD  ? ? ?Inpatient Medications: ?Scheduled Meds: ? ?Continuous Infusions: ? diltiazem (CARDIZEM) infusion 10 mg/hr (11/04/21 1530)  ? heparin    ? ?PRN Meds: ? ? ?Allergies:    ?Allergies  ?Allergen Reactions  ? Colcrys [Colchicine] Nausea Only  ?  Stomach upset ?  ? Lovastatin Other (See Comments)  ?  myalgia  ? ? ?Social History:   ?Social History  ? ?Socioeconomic History  ? Marital status: Widowed  ?  Spouse name: Not on file  ? Number of children: Not on file  ?  Years of education: Not on file  ? Highest education level: Not on file  ?Occupational History  ? Not on file  ?Tobacco Use  ? Smoking status: Former  ?  Types: Cigarettes  ?  Quit date: 10/02/1969  ?  Years since quitting: 52.1  ? Smokeless tobacco: Never  ?Vaping Use  ? Vaping Use: Never used  ?Substance and Sexual Activity  ? Alcohol use: No  ?  Alcohol/week: 0.0 standard drinks  ? Drug use: No  ? Sexual activity: Not Currently  ?Other Topics Concern  ? Not on file  ?Social History Narrative  ? Not on file  ? ?Social Determinants of Health  ? ?Financial Resource Strain: Not on file  ?Food Insecurity: Not on file  ?Transportation Needs: Not on file  ?Physical Activity: Not on file  ?Stress: Not on file  ?Social Connections: Not on file  ?Intimate Partner Violence: Not on file  ?  ?Family History:   ? ?Family History  ?Problem Relation Age of Onset  ? Hyperlipidemia Mother   ? Hypertension Mother   ? Cancer Father   ? Heart disease Father   ? CAD Maternal Grandmother   ? CAD Maternal Grandfather   ? Lung cancer Sister   ?  ? ?ROS:  ?Please see the history of present illness.  ? ?All other ROS reviewed and negative.    ? ?Physical Exam/Data:  ? ?Vitals:  ? 11/04/21 1500 11/04/21 1515 11/04/21 1545 11/04/21 1630  ?BP: 138/81 132/82 101/81 128/75  ?Pulse: (!) 141 (!) 142 (!) 143 (!) 142  ?Resp: 20 (!) '22 16 20  '$ ?Temp:      ?TempSrc:      ?SpO2: 100% 98% 100% 96%  ?Weight:      ?Height:      ? ? ?Intake/Output Summary (Last 24 hours) at 11/04/2021 1724 ?Last data filed at 11/04/2021 1530 ?Gross per 24 hour  ?Intake 518.16 ml  ?Output --  ?Net 518.16 ml  ? ? ?  11/04/2021  ?  1:06 PM 01/15/2021  ?  7:36 PM 07/11/2018  ?  3:00 AM  ?Last 3 Weights  ?Weight (lbs) 141 lb 12.1 oz 141 lb 12.1 oz 141 lb 12.1 oz  ?Weight (kg) 64.3 kg 64.3 kg 64.3 kg  ?   ?Body mass index is 21.55 kg/m?.  ?General: thin elderly appearing ?HEENT: normal ?Lymph: no adenopathy ?Neck: no JVD ?Endocrine:  No thryomegaly ?Vascular: No carotid bruits; FA  pulses 2+ bilaterally without bruits  ?Cardiac:  normal S1, S2; regular but tachycardic; no murmur  ?Lungs:  clear to auscultation bilaterally, no wheezing, rhonchi or rales  ?Abd: soft, nontender, no hepatomegaly  ?Ext: no edema ?Musculoskeletal:  No deformities, BUE and BLE strength normal and equal ?Skin: warm and dry  ?Neuro:  CNs 2-12 intact, no focal abnormalities noted ?Psych:  Normal affect  ? ?EKG:  The EKG was personally reviewed and demonstrates: Atrial flutter with RVR at 142 bpm and right bundle branch block ?Telemetry:  Telemetry was personally reviewed and demonstrates: Atrial flutter with RVR ? ?Relevant CV Studies: ?none ? ?Laboratory Data: ? ?High Sensitivity Troponin:   ?Recent Labs  ?Lab 11/04/21 ?1327 11/04/21 ?1533  ?TROPONINIHS 24* 22*  ?   ?Chemistry ?Recent Labs  ?Lab 11/04/21 ?1352  ?NA 143  ?K 4.3  ?CL 111  ?CO2 24  ?GLUCOSE 186*  ?BUN 44*  ?CREATININE 1.54*  ?CALCIUM 8.7*  ?GFRNONAA 32*  ?ANIONGAP 8  ?  ?No results for input(s): PROT, ALBUMIN, AST, ALT, ALKPHOS, BILITOT in the last 168 hours. ?Hematology ?Recent Labs  ?Lab 11/04/21 ?1352  ?WBC 10.8*  ?RBC 4.45  ?HGB 14.0  ?HCT 44.4  ?MCV 99.8  ?MCH 31.5  ?MCHC 31.5  ?RDW 12.9  ?PLT 190  ? ?BNPNo results for input(s): BNP, PROBNP in the last 168 hours.  ?DDimer No results for input(s): DDIMER in the last 168 hours. ? ? ?Radiology/Studies:  ?DG Chest Port 1 View ? ?Result Date: 11/04/2021 ?CLINICAL DATA:  88 year old female with tachycardia EXAM: PORTABLE CHEST 1 VIEW COMPARISON:  01/15/2021 FINDINGS: Cardiomediastinal silhouette unchanged in size and contour. Calcifications of the aortic arch. Similar appearance of interstitial opacities with reticular pattern in the periphery. No new confluent airspace disease, pneumothorax, or pleural effusion. No displaced fracture IMPRESSION: Unchanged appearance of the chest x-ray with favored chronic changes and no definite evidence of acute cardiopulmonary disease Electronically Signed   By: Corrie Mckusick D.O.   On: 11/04/2021 14:15   ? ? ?Assessment and Plan:  ? ?New onset atrial flutter with RVR ?-Patient tachycardic and EMS was called to titrate to the ER for further evaluation ?-Noted to be in SVT a

## 2021-11-04 NOTE — ED Triage Notes (Signed)
Pt to ED via EMS from [facility] for tachycardia. EMS reports patient was having a physical done today and was found to have a HR in the 120s. SNF called EMS for transport to ED for evaluation. Ems states pt was sleeping at dinning room table on arrival. Pt alert to self on arrival to ED. Hx dementia, DM2. 584m NS given PTA by EMS. ? ?EMS v/s: ?144 HR ?140/82 ?96% room air ?283 CBG ? ?

## 2021-11-04 NOTE — Progress Notes (Signed)
FPTS Brief Progress Note ? ?S: Patient resting in bed with daughter at bedside.  She reports that she is thirsty but otherwise has no complaints. ? ? ?O: ?BP 128/75   Pulse (!) 142   Temp (!) 97.5 ?F (36.4 ?C) (Oral)   Resp 20   Ht '5\' 8"'$  (1.727 m)   Wt 64.3 kg   SpO2 96%   BMI 21.55 kg/m?   ?Gen: NAD, pleasant, tangential in speech  ?Pulm: Normal WOB  ? ?A/P: ?Continue with plan per day team, scheduled cardioversion set for tomorrow.  She will also need a TEE during this admission.  Heart rates have remained in the 140s while on a Cardizem drip.  Cardiology following, appreciate recommendations. ?- Orders reviewed. Labs for AM ordered, which was adjusted as needed.  ? ? ?Erskine Emery, MD ?11/04/2021, 8:46 PM ?PGY-1, [facility] Night Resident  ?Please page [phone] with questions.  ? ? ?

## 2021-11-04 NOTE — Progress Notes (Addendum)
Addendum 11/04/21 7:00 PM ?Transitioned to eliquis 2.'5mg'$  BID ?Continue to monitor for s/s of bleeding and H/H ? ?Lorelei Pont, PharmD, BCPS ?11/04/2021 7:01 PM ?ED Clinical Pharmacist -  [phone] ? ? ?ANTICOAGULATION CONSULT NOTE - Initial Consult ? ?Pharmacy Consult for Heparin ?Indication: atrial fibrillation ? ?Allergies  ?Allergen Reactions  ? Colcrys [Colchicine] Nausea Only  ?  Stomach upset ?  ? Lovastatin Other (See Comments)  ?  myalgia  ? ? ?Patient Measurements: ?Height: '5\' 8"'$  (172.7 cm) ?Weight: 64.3 kg (141 lb 12.1 oz) ?IBW/kg (Calculated) : 63.9 ?Heparin Dosing Weight: 64.3 kg ? ?Vital Signs: ?Temp: 97.5 ?F (36.4 ?C) (04/17 1306) ?Temp Source: Oral (04/17 1306) ?BP: 136/90 (04/17 1345) ?Pulse Rate: 143 (04/17 1345) ? ?Labs: ?No results for input(s): HGB, HCT, PLT, APTT, LABPROT, INR, HEPARINUNFRC, HEPRLOWMOCWT, CREATININE, CKTOTAL, CKMB, TROPONINIHS in the last 72 hours. ? ?CrCl cannot be calculated (Patient's most recent lab result is older than the maximum 21 days allowed.). ? ? ?Medical History: ?Past Medical History:  ?Diagnosis Date  ? Anxiety   ? Depression   ? Diabetes mellitus without complication ([place])   ? Hyperlipidemia   ? Hypertension   ? Renal disorder   ? Kidney disease  ? Vitamin D deficiency   ? ? ?Medications:  ?(Not in a hospital admission)  ?Scheduled:  ?Infusions:  ? diltiazem (CARDIZEM) infusion 7.5 mg/hr (11/04/21 1451)  ? ?PRN:  ? ?Assessment: ?87 yof with a history of anxiety, depression, DM, HLD, HTN. Patient is presenting with tachycardia. Heparin per pharmacy consult placed for atrial fibrillation. ? ?Patient is not on anticoagulation prior to arrival. ? ?Hgb 14; plt 190 ?PT / INR 13.4 / 1 ? ?Goal of Therapy:  ?Heparin level 0.3-0.7 units/ml ?Monitor platelets by anticoagulation protocol: Yes ?  ?Plan:  ?No initial bolus ?Start heparin infusion at 1000 units/hr ?Check anti-Xa level in 8 hours and daily while on heparin ?Continue to monitor H&H and platelets ? ?Lorelei Pont, PharmD, BCPS ?11/04/2021 2:57 PM ?ED Clinical Pharmacist -  [phone] ? ?  ?

## 2021-11-04 NOTE — Progress Notes (Signed)
?  [facility]MTS Attending Admission Note: ?Yehuda Savannah, MD  ?Personal pager:  [phone] ?[facility] Service Pager:  [phone] ?  ?I  have seen and examined this patient, reviewed their chart. Resident H&P to follow. ? ?Ms Emily Waller is a 88 yo F with PMH HTN, T2DM, HLD, HFpEF (G2DD on echo in 06/2018), depression who presented for a physical at her SNF and found to be HR in 120s. EMS was called and she was brought to ED and had HR in 140s. At bedside this afternoon, she denies any chest pain, shortness of breath, orthopnea, leg swelling, abdominal pain, palpitations. She is not sure why she is here. She does note she has been coughing for quite some time, but notes it is a dry cough. ? ?On exam, heart regular rhythm but tachycardic, no murmurs rubs or gallops. Lungs CTAB. Abd soft. No edema. ? ?Vitals reviewed, HR 140s, BP stable, on RA.  ? ?Trop 24, 22. Cr 1.54, baseline 1.2-1.4. CBC with mild leukocytosis 10.8. CXR shows reticular peripheral interstitial opacities.  ? ?EKG reviewed, ? Atrial Flutter with RVR with RBBB.  ? ?New onset aflutter with RVR- appreciate cardiology consulted in ED. As pt is stable, consider starting therapeutic Lovenox vs DOAC Eliquis at reduced dose 2.'5mg'$  BID as her CHADS2VASC is 5. On cardizem gtt, consider starting low dose BB pending cards recommendations so we can start to wean drip, she does not appear in be volume overloaded or in heart failure. Appreciate cardiology input. ? ?Resident H&P to follow.  ? ?

## 2021-11-04 NOTE — ED Provider Notes (Signed)
?[facility] ?Provider Note ? ? ?CSN: [id number] ?Arrival date & time: 11/04/21  1257 ? ?  ? ?History ? ?Chief Complaint  ?Patient presents with  ? Tachycardia  ? ? ?Emily Waller is a 88 y.o. female. ? ?HPI ?Patient presents with tachycardia.  This was found incidentally during a physical that was done at her assisted living facility.  She denies any associated symptoms.  Initially, heart rate was in the 120s.  EMS provided 500 cc of normal saline.  With the fluids, patient had elevation in her heart rate in the range of 140s.  Currently, patient denies any physical complaints.  Her medical history includes DM, HTN, HLD, depression, and anxiety.  She is not on a blood thinner and is not on any AV nodal agents. ? ?History per daughter: Patient has been dealing with a cough and was started on 5 days of an antibiotic at her facility.  She is unaware of what the antibiotic was. ?  ? ?Home Medications ?Prior to Admission medications   ?Medication Sig Start Date End Date Taking? Authorizing Provider  ?acetaminophen (TYLENOL) 325 MG tablet Take 650 mg by mouth 2 (two) times daily.   Yes [provider]  ?allopurinol (ZYLOPRIM) 100 MG tablet Take 100 mg by mouth daily.   Yes [provider]  ?amLODipine (NORVASC) 5 MG tablet Take 5 mg by mouth daily.   Yes [provider]  ?atorvastatin (LIPITOR) 10 MG tablet Take 10 mg by mouth at bedtime.   Yes [provider]  ?mirtazapine (REMERON) 7.5 MG tablet Take 7.5 mg by mouth at bedtime.   Yes [provider]  ?nitrofurantoin, macrocrystal-monohydrate, (MACROBID) 100 MG capsule Take 100 mg by mouth 2 (two) times daily. For 5 days   Yes [provider]  ?nystatin ointment (MYCOSTATIN) Apply 1 application. topically 2 (two) times daily. Left Breast   Yes [provider]  ?pantoprazole (PROTONIX) 40 MG tablet Take 40 mg by mouth daily.   Yes [provider]  ?traMADol  (ULTRAM) 50 MG tablet Take 1 tablet (50 mg total) by mouth every 6 (six) hours as needed. ?Patient taking differently: Take 50 mg by mouth every 6 (six) hours as needed for moderate pain. 02/17/21  Yes Palumbo, April, MD  ?Turmeric 500 MG CAPS Take 500 mg by mouth daily.   Yes [provider]  ?Vitamin D, Ergocalciferol, (DRISDOL) 1.25 MG (50000 UNIT) CAPS capsule Take 50,000 Units by mouth every Tuesday.   Yes [provider]  ?sertraline (ZOLOFT) 50 MG tablet Take 1 tablet (50 mg total) by mouth daily. ?Patient not taking: Reported on 11/04/2021 07/11/18   Mendel Corning, MD  ?   ? ?Allergies    ?Colcrys [colchicine] and Lovastatin   ? ?Review of Systems   ?Review of Systems  ?Respiratory:  Negative for shortness of breath.   ?Cardiovascular:  Negative for chest pain and palpitations.  ?All other systems reviewed and are negative. ? ?Physical Exam ?Updated Vital Signs ?BP 128/75   Pulse (!) 142   Temp (!) 97.5 ?F (36.4 ?C) (Oral)   Resp 20   Ht '5\' 8"'$  (1.727 m)   Wt 64.3 kg   SpO2 96%   BMI 21.55 kg/m?  ?Physical Exam ?Vitals and nursing note reviewed.  ?Constitutional:   ?   General: She is not in acute distress. ?   Appearance: Normal appearance. She is well-developed. She is not ill-appearing, toxic-appearing or diaphoretic.  ?HENT:  ?  Head: Normocephalic and atraumatic.  ?   Right Ear: External ear normal.  ?   Left Ear: External ear normal.  ?   Nose: Nose normal.  ?   Mouth/Throat:  ?   Mouth: Mucous membranes are moist.  ?   Pharynx: Oropharynx is clear.  ?Eyes:  ?   Extraocular Movements: Extraocular movements intact.  ?   Conjunctiva/sclera: Conjunctivae normal.  ?Cardiovascular:  ?   Rate and Rhythm: Regular rhythm. Tachycardia present.  ?   Heart sounds: No murmur heard. ?Pulmonary:  ?   Effort: Pulmonary effort is normal. No respiratory distress.  ?   Breath sounds: No wheezing, rhonchi or rales.  ?   Comments: Cough is present ?Abdominal:  ?   Palpations: Abdomen is soft.  ?    Tenderness: There is no abdominal tenderness.  ?Musculoskeletal:     ?   General: No swelling.  ?   Cervical back: Normal range of motion and neck supple.  ?   Right lower leg: No edema.  ?   Left lower leg: No edema.  ?Skin: ?   General: Skin is warm and dry.  ?   Capillary Refill: Capillary refill takes less than 2 seconds.  ?   Coloration: Skin is not jaundiced or pale.  ?Neurological:  ?   General: No focal deficit present.  ?   Mental Status: She is alert. Mental status is at baseline.  ?Psychiatric:     ?   Mood and Affect: Mood normal.     ?   Behavior: Behavior normal.     ?   Thought Content: Thought content normal.     ?   Judgment: Judgment normal.  ? ? ?ED Results / Procedures / Treatments   ?Labs ?(all labs ordered are listed, but only abnormal results are displayed) ?Labs Reviewed  ?BASIC METABOLIC PANEL - Abnormal; Notable for the following components:  ?    Result Value  ? Glucose, Bld 186 (*)   ? BUN 44 (*)   ? Creatinine, Ser 1.54 (*)   ? Calcium 8.7 (*)   ? GFR, Estimated 32 (*)   ? All other components within normal limits  ?CBC - Abnormal; Notable for the following components:  ? WBC 10.8 (*)   ? All other components within normal limits  ?TROPONIN I (HIGH SENSITIVITY) - Abnormal; Notable for the following components:  ? Troponin I (High Sensitivity) 24 (*)   ? All other components within normal limits  ?TROPONIN I (HIGH SENSITIVITY) - Abnormal; Notable for the following components:  ? Troponin I (High Sensitivity) 22 (*)   ? All other components within normal limits  ?RESP PANEL BY RT-PCR (FLU A&B, COVID) ARPGX2  ?MAGNESIUM  ?TSH  ?PROTIME-INR  ?BASIC METABOLIC PANEL  ?MAGNESIUM  ?PROTIME-INR  ?BASIC METABOLIC PANEL  ? ? ?EKG ?EKG Interpretation ? ?Date/Time:  Monday November 04 2021 13:04:53 EDT ?Ventricular Rate:  142 ?PR Interval:  70 ?QRS Duration: 135 ?QT Interval:  319 ?QTC Calculation: 491 ?R Axis:   69 ?Text Interpretation: Sinus tachycardia Right bundle branch block Confirmed by Octaviano Glow [phone]) on 11/04/2021 4:34:35 PM ? ?Radiology ?DG Chest Port 1 View ? ?Result Date: 11/04/2021 ?CLINICAL DATA:  88 year old female with tachycardia EXAM: PORTABLE CHEST 1 VIEW COMPARISON:  01/15/2021 FINDINGS: Cardiomediastinal silhouette unchanged in size and contour. Calcifications of the aortic arch. Similar appearance of interstitial opacities with reticular pattern in the periphery. No new confluent airspace disease, pneumothorax, or pleural effusion. No  displaced fracture IMPRESSION: Unchanged appearance of the chest x-ray with favored chronic changes and no definite evidence of acute cardiopulmonary disease Electronically Signed   By: Corrie Mckusick D.O.   On: 11/04/2021 14:15   ? ?Procedures ?Procedures  ? ? ?Medications Ordered in ED ?Medications  ?diltiazem (CARDIZEM) 1 mg/mL load via infusion 10 mg (10 mg Intravenous Bolus from Bag 11/04/21 1402)  ?  And  ?diltiazem (CARDIZEM) 125 mg in dextrose 5% 125 mL (1 mg/mL) infusion (10 mg/hr Intravenous Infusion Verify 11/04/21 1530)  ?metoprolol tartrate (LOPRESSOR) tablet 25 mg (has no administration in time range)  ?0.9 %  sodium chloride infusion (has no administration in time range)  ?allopurinol (ZYLOPRIM) tablet 100 mg (has no administration in time range)  ?atorvastatin (LIPITOR) tablet 10 mg (has no administration in time range)  ?mirtazapine (REMERON) tablet 7.5 mg (has no administration in time range)  ?nitrofurantoin (macrocrystal-monohydrate) (MACROBID) capsule 100 mg (has no administration in time range)  ?pantoprazole (PROTONIX) EC tablet 40 mg (has no administration in time range)  ?traMADol (ULTRAM) tablet 50 mg (has no administration in time range)  ?apixaban (ELIQUIS) tablet 2.5 mg (has no administration in time range)  ?lactated ringers bolus 500 mL (0 mLs Intravenous Stopped 11/04/21 1530)  ? ? ?ED Course/ Medical Decision Making/ A&P ?  ?                        ?Medical Decision Making ?Amount and/or Complexity of Data Reviewed ?Labs:  ordered. ?Radiology: ordered. ? ?Risk ?Decision regarding hospitalization. ? ? ?This patient presents to the ED for concern of tachycardia, this involves an extensive number of treatment options, and is a complaint t

## 2021-11-04 NOTE — H&P (Addendum)
Family Medicine Teaching Service ?Hospital Admission History and Physical ?Service Pager: [phone] ? ?Patient name: Emily Waller Medical record number: [id number] ?Date of birth: 1927/12/16 Age: 88 y.o. Gender: female ? ?Primary Care Provider: Patient, No Pcp Per (Inactive) ?Consultants: Cardiology ?Code Status: DNR  ?Preferred Emergency Contact: Daughter Manuella Ghazi [phone]  ? ?Chief Complaint: Tachycardia ? ?Assessment and Plan: ?Emily Waller is a 88 y.o. female presenting with new-onset atrial flutter. PMH is significant for Alzheimer's disease, hypertension, Gout, hyperlipidemia, diabetes, and anxiety/depression. ? ?New Onset Atrial Flutter ?No prior history of atrial fib/flutter.  Heart rate on arrival 142, maintaining good blood pressures at 131/75.  Breathing comfortably on room air.  ECG with atrial flutter, 2-1 conduction with right bundle branch block.  Bundle branch block is new since 2019.  CHA2DS2-VASc 5.  Patient is entirely asymptomatic.  Troponin 24 > 22.  TSH is within normal limits.  Electrolytes within normal limits.  CBC significant only for slight leukocytosis to 10.8.  CXR without evidence of active cardiopulmonary disease.  Patient placed on diltiazem drip by EDP, remained in atrial flutter at the time of my interview.  Patient evaluated by cardiology with plan for addition of metoprolol and consideration of TEE cardioversion tomorrow if no improvement in heart rate overnight. ?-Admit to cardiac telemetry, Dr. Erin Hearing attending ?-Cardiology following, appreciate their recommendations ?-Cardiac monitoring ?-Continue diltiazem gtt. ?-Metoprolol tartrate 25 mg twice daily per cardiology ?-Eliquis 2.5 mg twice daily (dose reduction based on creatinine and age) ?-Will obtain TTE ?-TEE cardioversion with cardiology tomorrow if no improvement overnight ?-NPO at [facility] ahead of possible intervention ?-Will re-check electrolytes in am ?-EKG prn for changes in status ? ?Elevated  troponin ?24>22. No CP/SOB. Suspect demand ischemia in the setting of new atrial flutter ? ?HTN ?BP well-controlled at this time. ?- Will hold home amlodipine while on Lopressor and Cardizem ? ?Alzheimer's Disease ?Patient presents from [facility] memory care unit. Very pleasantly demented. ?- Delirium and Fall precautions ?- Anticipate discharge back to SNF ? ?Recent UTI  Leukocytosis, mild ?Patient on day 4/5 of Macrobid for a UTI diagnosed at her facility. Suspect this is the source of her leukocytosis.  ?- Continue Macrobid '100mg'$  BID for 3 doses ? ?Chronic Pain ?Patient takes Tramadol '50mg'$  q6h PRN at home. ?- Tramadol '50mg'$  q12h PRN given CrCl <30 ? ?Anxiety/Depression ?Previously on Zoloft but per facility report has not taken in some time. ?- Continue mirtazapine 7.'5mg'$  QHS ? ?Hyperlipidemia ?- Continue Lipitor '10mg'$  QHS ? ?Gout ?- Continue allopurinol ? ?FEN/GI: Regular diet, NPO at [facility] ?Prophylaxis: On Eliquis  ? ?Disposition: Cardiac tele ? ?History of Present Illness:  Emily Waller is a 88 y.o. female presenting with tachycardia. ?History obtained from patient's daughter and ED provider note given patient's advanced dementia and inability to give a reliable history. ? ?Patient lives in memory care unit at [facility].  She receives both PT and OT services at her facility.  She was working with physical therapy earlier today and was noted to have an elevated heart rate to the 120s.  The facility called EMS.  EMS found the patient sleeping at the dining room table and completely asymptomatic.  Patient denies any history of cardiac arrhythmias.  She denies any chest pain, shortness of breath, lower extremity edema, palpitations.  She is not able to articulate why she was brought to the ED but says "I guess they thought I needed to get it checked out." ? ?Patient presents with DNR form  from facility.  Her daughter confirms that this is the patient's wish.  Daughter Manuella Ghazi is her HCPOA. ? ?Review  Of Systems: Per HPI with the following additions:  ? ?Review of Systems  ?Unable to perform ROS: Dementia   ? ?Patient Active Problem List  ? Diagnosis Date Noted  ? Atrial fibrillation with RVR ([place]) 11/04/2021  ? Atrial flutter with rapid ventricular response ([place])   ? Symptomatic bradycardia 07/07/2018  ? Renal insufficiency 07/07/2018  ? Diet-controlled diabetes mellitus ([place]) 07/07/2018  ? Itching 09/12/2015  ? Diabetes ([place]) 09/12/2015  ? HTN (hypertension) 09/12/2015  ? Depression 09/12/2015  ? ? ?Past Medical History: ?Past Medical History:  ?Diagnosis Date  ? Anxiety   ? Depression   ? Diabetes mellitus without complication ([place])   ? Hyperlipidemia   ? Hypertension   ? Renal disorder   ? Kidney disease  ? Vitamin D deficiency   ? ? ?Past Surgical History: ?No past surgical history on file. ? ?Social History: ?Social History  ? ?Tobacco Use  ? Smoking status: Former  ?  Types: Cigarettes  ?  Quit date: 10/02/1969  ?  Years since quitting: 52.1  ? Smokeless tobacco: Never  ?Vaping Use  ? Vaping Use: Never used  ?Substance Use Topics  ? Alcohol use: No  ?  Alcohol/week: 0.0 standard drinks  ? Drug use: No  ? ? ?Family History: ?Family History  ?Problem Relation Age of Onset  ? Hyperlipidemia Mother   ? Hypertension Mother   ? Cancer Father   ? Heart disease Father   ? CAD Maternal Grandmother   ? CAD Maternal Grandfather   ? Lung cancer Sister   ? ? ?Allergies and Medications: ?Allergies  ?Allergen Reactions  ? Colcrys [Colchicine] Nausea Only  ?  Stomach upset ?  ? Lovastatin Other (See Comments)  ?  myalgia  ? ?No current facility-administered medications on file prior to encounter.  ? ?Current Outpatient Medications on File Prior to Encounter  ?Medication Sig Dispense Refill  ? acetaminophen (TYLENOL) 325 MG tablet Take 650 mg by mouth 2 (two) times daily.    ? allopurinol (ZYLOPRIM) 100 MG tablet Take 100 mg by mouth daily.    ? amLODipine (NORVASC) 5 MG tablet Take 5 mg by mouth daily.    ? atorvastatin  (LIPITOR) 10 MG tablet Take 10 mg by mouth at bedtime.    ? mirtazapine (REMERON) 7.5 MG tablet Take 7.5 mg by mouth at bedtime.    ? nitrofurantoin, macrocrystal-monohydrate, (MACROBID) 100 MG capsule Take 100 mg by mouth 2 (two) times daily. For 5 days    ? nystatin ointment (MYCOSTATIN) Apply 1 application. topically 2 (two) times daily. Left Breast    ? pantoprazole (PROTONIX) 40 MG tablet Take 40 mg by mouth daily.    ? traMADol (ULTRAM) 50 MG tablet Take 1 tablet (50 mg total) by mouth every 6 (six) hours as needed. (Patient taking differently: Take 50 mg by mouth every 6 (six) hours as needed for moderate pain.) 7 tablet 0  ? Turmeric 500 MG CAPS Take 500 mg by mouth daily.    ? Vitamin D, Ergocalciferol, (DRISDOL) 1.25 MG (50000 UNIT) CAPS capsule Take 50,000 Units by mouth every Tuesday.    ? sertraline (ZOLOFT) 50 MG tablet Take 1 tablet (50 mg total) by mouth daily. (Patient not taking: Reported on 11/04/2021) 20 tablet 0  ? ? ?Objective: ?BP 128/75   Pulse (!) 142   Temp (!) 97.5 ?F (36.4 ?  C) (Oral)   Resp 20   Ht '5\' 8"'$  (1.727 m)   Wt 64.3 kg   SpO2 96%   BMI 21.55 kg/m?  ?Exam: ?General: Frail appearing, elderly, very pleasant ?Eyes: EOMs intact ?ENTM: Mucous membranes moist ?Neck: Without JVD or thyromegaly ?Cardiovascular: Tachycardic, regular, without murmur, pulses 2+ throughout ?Respiratory: Normal WOB on RA, lungs clear throughout ?Gastrointestinal: Soft, non-tender, without mass ?MSK: Without edema or deformity ?Derm: Warm and dry, bruising noted but appropriate for age ?Neuro: Oriented only to person, pleasantly demented, no focal deficits ?Psych: Mood and affect are appropriate ? ?Labs and Imaging: ?CBC BMET  ?Recent Labs  ?Lab 11/04/21 ?1352  ?WBC 10.8*  ?HGB 14.0  ?HCT 44.4  ?PLT 190  ? Recent Labs  ?Lab 11/04/21 ?1352  ?NA 143  ?K 4.3  ?CL 111  ?CO2 24  ?BUN 44*  ?CREATININE 1.54*  ?GLUCOSE 186*  ?CALCIUM 8.7*  ?  ? ?EKG: Atrial flutter with RVR, 142 bpm, right bundle branch block new  since 2019, QTc 491 ? ?DG Chest Port 1 View ?CLINICAL DATA:  88 year old female with tachycardia ? ?EXAM: ?PORTABLE CHEST 1 VIEW ? ?COMPARISON:  01/15/2021 ? ?FINDINGS: ?Cardiomediastinal silhouette unchanged in size and co

## 2021-11-04 NOTE — Hospital Course (Addendum)
Emily Waller is a 88 y.o. female presenting with new-onset atrial flutter. PMH is significant for Alzheimer's disease, hypertension, Gout, hyperlipidemia, diabetes, and anxiety/depression. ? ?New Onset Atrial Flutter ?Patient presented via EMS after being found tachycardic at her home memory care unit.  Heart rate on arrival was 142 and ECG showed atrial flutter with a 2-1 conduction and right bundle branch block.  Troponins were slightly elevated but flat at 24 > 22.  Patient was started on a diltiazem drip and cardiology was consulted.  Cardiology recommended addition of Lopressor and initiation of Eliquis for a CHA2DS2-VASc of 5.  TTE was obtained which showed severe LVH with EF of 55 to 60% without thrombus.  She was rate controlled reasonably well on metoprolol '25mg'$  q8h.  Due to adequate rate control and her advanced age, decision was made not to pursue TEE cardioversion.  ? ?Recent History of UTI ?Patient presented on day 4/5 of Macrobid therapy for a UTI. Discontinued Macrobid during hospitalization prior to completing course due to decreased renal function. Mild leukocytosis on admission that appropriately down trended without other infectious/systemic symptoms during stay.   ? ?Alzheimer's Disease ?Patient was pleasantly demented on arrival.  She was maintained on delirium and fall precautions and did well. ? ?Items for PCP follow-up ?Patient will require long-term anticoagulation due to CHA2DS2-VASc score of 5. Discharged on Eliquis 2.'5mg'$  BID ?Monitor for UTI symptoms ?Continue to titrate metoprolol as needed for rate control ?Monitor BP, blood pressures were stable with discontinuation of amlodipine and initiation of metoprolol.  ?

## 2021-11-04 NOTE — ED Provider Notes (Signed)
Sign out given to family medicine service for admission ?  ?Wyvonnia Dusky, MD ?11/04/21 1654 ? ?

## 2021-11-04 NOTE — ED Notes (Signed)
Water provided to drink and with meds.  Pt did not want anything to eat ?

## 2021-11-05 ENCOUNTER — Other Ambulatory Visit (HOSPITAL_COMMUNITY): Payer: Self-pay

## 2021-11-05 ENCOUNTER — Observation Stay (HOSPITAL_COMMUNITY): Payer: Medicare Other

## 2021-11-05 DIAGNOSIS — I4892 Unspecified atrial flutter: Secondary | ICD-10-CM | POA: Diagnosis present

## 2021-11-05 DIAGNOSIS — N1832 Chronic kidney disease, stage 3b: Secondary | ICD-10-CM | POA: Diagnosis present

## 2021-11-05 DIAGNOSIS — M109 Gout, unspecified: Secondary | ICD-10-CM | POA: Diagnosis present

## 2021-11-05 DIAGNOSIS — Z888 Allergy status to other drugs, medicaments and biological substances status: Secondary | ICD-10-CM | POA: Diagnosis not present

## 2021-11-05 DIAGNOSIS — F0284 Dementia in other diseases classified elsewhere, unspecified severity, with anxiety: Secondary | ICD-10-CM | POA: Diagnosis present

## 2021-11-05 DIAGNOSIS — Z801 Family history of malignant neoplasm of trachea, bronchus and lung: Secondary | ICD-10-CM | POA: Diagnosis not present

## 2021-11-05 DIAGNOSIS — N390 Urinary tract infection, site not specified: Secondary | ICD-10-CM | POA: Diagnosis present

## 2021-11-05 DIAGNOSIS — I4510 Unspecified right bundle-branch block: Secondary | ICD-10-CM | POA: Diagnosis present

## 2021-11-05 DIAGNOSIS — E785 Hyperlipidemia, unspecified: Secondary | ICD-10-CM | POA: Diagnosis present

## 2021-11-05 DIAGNOSIS — G309 Alzheimer's disease, unspecified: Secondary | ICD-10-CM | POA: Diagnosis present

## 2021-11-05 DIAGNOSIS — I4891 Unspecified atrial fibrillation: Secondary | ICD-10-CM | POA: Diagnosis present

## 2021-11-05 DIAGNOSIS — G8929 Other chronic pain: Secondary | ICD-10-CM | POA: Diagnosis present

## 2021-11-05 DIAGNOSIS — F32A Depression, unspecified: Secondary | ICD-10-CM | POA: Diagnosis present

## 2021-11-05 DIAGNOSIS — I129 Hypertensive chronic kidney disease with stage 1 through stage 4 chronic kidney disease, or unspecified chronic kidney disease: Secondary | ICD-10-CM | POA: Diagnosis present

## 2021-11-05 DIAGNOSIS — I471 Supraventricular tachycardia: Secondary | ICD-10-CM | POA: Diagnosis present

## 2021-11-05 DIAGNOSIS — I248 Other forms of acute ischemic heart disease: Secondary | ICD-10-CM | POA: Diagnosis present

## 2021-11-05 DIAGNOSIS — Z66 Do not resuscitate: Secondary | ICD-10-CM | POA: Diagnosis present

## 2021-11-05 DIAGNOSIS — Z20822 Contact with and (suspected) exposure to covid-19: Secondary | ICD-10-CM | POA: Diagnosis present

## 2021-11-05 DIAGNOSIS — Z8249 Family history of ischemic heart disease and other diseases of the circulatory system: Secondary | ICD-10-CM | POA: Diagnosis not present

## 2021-11-05 DIAGNOSIS — Z83438 Family history of other disorder of lipoprotein metabolism and other lipidemia: Secondary | ICD-10-CM | POA: Diagnosis not present

## 2021-11-05 DIAGNOSIS — Z87891 Personal history of nicotine dependence: Secondary | ICD-10-CM | POA: Diagnosis not present

## 2021-11-05 DIAGNOSIS — E1122 Type 2 diabetes mellitus with diabetic chronic kidney disease: Secondary | ICD-10-CM | POA: Diagnosis present

## 2021-11-05 DIAGNOSIS — F0283 Dementia in other diseases classified elsewhere, unspecified severity, with mood disturbance: Secondary | ICD-10-CM | POA: Diagnosis present

## 2021-11-05 DIAGNOSIS — E1165 Type 2 diabetes mellitus with hyperglycemia: Secondary | ICD-10-CM | POA: Diagnosis not present

## 2021-11-05 LAB — ECHOCARDIOGRAM COMPLETE
AR max vel: 1.91 cm2
AV Area VTI: 1.74 cm2
AV Area mean vel: 1.81 cm2
AV Mean grad: 3 mmHg
AV Peak grad: 5.3 mmHg
Ao pk vel: 1.15 m/s
Calc EF: 48.5 %
Height: 68 in
MV M vel: 3.62 m/s
MV Peak grad: 52.4 mmHg
P 1/2 time: 433 msec
S' Lateral: 2.5 cm
Single Plane A2C EF: 42.6 %
Single Plane A4C EF: 56.7 %
Weight: 2268.09 oz

## 2021-11-05 LAB — BASIC METABOLIC PANEL
Anion gap: 10 (ref 5–15)
BUN: 37 mg/dL — ABNORMAL HIGH (ref 8–23)
CO2: 22 mmol/L (ref 22–32)
Calcium: 8.6 mg/dL — ABNORMAL LOW (ref 8.9–10.3)
Chloride: 104 mmol/L (ref 98–111)
Creatinine, Ser: 1.4 mg/dL — ABNORMAL HIGH (ref 0.44–1.00)
GFR, Estimated: 36 mL/min — ABNORMAL LOW (ref >60)
Glucose, Bld: 208 mg/dL — ABNORMAL HIGH (ref 70–99)
Potassium: 4.1 mmol/L (ref 3.5–5.1)
Sodium: 136 mmol/L (ref 135–145)

## 2021-11-05 LAB — MAGNESIUM: Magnesium: 1.8 mg/dL (ref 1.7–2.4)

## 2021-11-05 MED ORDER — MAGNESIUM SULFATE 2 GM/50ML IV SOLN
2.0000 g | Freq: Once | INTRAVENOUS | Status: AC
  Administered 2021-11-05: 2 g via INTRAVENOUS
  Filled 2021-11-05: qty 50

## 2021-11-05 MED ORDER — METOPROLOL TARTRATE 25 MG PO TABS
25.0000 mg | ORAL_TABLET | Freq: Three times a day (TID) | ORAL | Status: DC
  Administered 2021-11-05 – 2021-11-06 (×2): 25 mg via ORAL
  Filled 2021-11-05 (×2): qty 1

## 2021-11-05 MED ORDER — METOPROLOL TARTRATE 25 MG PO TABS
25.0000 mg | ORAL_TABLET | Freq: Two times a day (BID) | ORAL | Status: DC
  Administered 2021-11-05: 25 mg via ORAL
  Filled 2021-11-05: qty 1

## 2021-11-05 MED ORDER — METOPROLOL TARTRATE 25 MG PO TABS: 50.0000 mg | ORAL_TABLET | Freq: Two times a day (BID) | ORAL | Status: DC

## 2021-11-05 MED ORDER — DILTIAZEM HCL-DEXTROSE 125-5 MG/125ML-% IV SOLN (PREMIX): 5.0000 mg/h | INTRAVENOUS | Status: DC

## 2021-11-05 NOTE — Progress Notes (Signed)
Family Medicine Teaching Service ?Daily Progress Note ?Intern Pager: [phone] ? ?Patient name: Emily Waller Medical record number: [id number] ?Date of birth: [birth year] Age: 88 y.o. Gender: female ? ?Primary Care Provider: Patient, No Pcp Per (Inactive) ?Consultants: Cardiology ?Code Status: DNR ? ?Pt Overview and Major Events to Date:  ?4/18- admitted, HR normalized with addition of metoprolol ? ?Assessment and Plan: ?Emily Waller is a 88 y.o. female presenting with new-onset atrial flutter. PMH is significant for Alzheimer's disease, hypertension, Gout, hyperlipidemia, diabetes, and anxiety/depression. ?  ?New Onset Atrial Flutter ?HR much improved with addition of metoprolol overnight. Rate controlled in 60s-80s for several hours, remained in A flutter but adequate rate control.  Unfortunately back in RVR this morning before receiving am metoprolol dose. Given her advanced age, I am hopeful that we will be able to adequately rate control her medically and avoid the need for cardioversion. Regardless, she will need ongoing rate control and anticoagulation. Started on Eliquis yesterday.  ?- Cardiology following, appreciate assistance ?- Wean off diltiazem gtt>Cardizem '120mg'$  daily ?- Continue metoprolol tartrate '25mg'$  BID ?- Eliquis 2.'5mg'$  BID ?- TTE pending ?- Will be NPO at [facility] for possible TEE DC cardioversion ? ?HTN ?BP remains well-controlled. ?- Hold amlodipine ?- Metoprolol as above ? ?Alzheimer's Disease ?Do not believe she is a great candidate for overly aggressive interventions. ?- Delirium and fall precautions ?- Anticipate discharge back to SNF ? ?Recent UTI  ?Patient did not receive Macrobid yesterday due to contraindication of renal function. No symptoms. Only had one day left of therapy. Will not treat further.  ? ?Chronic stable conditions ?Anxiety/Depression- Mirtazapine 7.'5mg'$  QHS ?HLD- lipitor '10mg'$  QHS ?Gout- allopurinol  ?Chronic pain- tramadol '50mg'$  q12h PRN ? ?FEN/GI: Regular, NPO at  midnight ?PPx: On Eliquis ?Dispo:SNF tomorrow. Barriers include rate controll.  ? ?Subjective:  ?Emily Waller denies any complaints this morning.  She denies chest pain or shortness of breath and is unaware of her elevated heart rate. ? ?Objective: ?Temp:  [97.5 ?F (36.4 ?C)] 97.5 ?F (36.4 ?C) (04/17 1306) ?Pulse Rate:  [31-147] 62 (04/18 0600) ?Resp:  [16-30] 18 (04/18 0600) ?BP: (95-154)/(59-102) 120/81 (04/18 0600) ?SpO2:  [90 %-100 %] 93 % (04/18 0600) ?Weight:  [64.3 kg] 64.3 kg (04/17 1306) ?Physical Exam: ?General: Frail and elderly, pleasantly demented ?Cardiovascular: Tachycardic, regular, no murmur, 2+ pulses ?Respiratory: Normal work of breathing on room air, lungs are clear ?Abdomen: Soft and nontender ?Extremities: Without edema or deformity, bruising noted but appropriate for age ? ?Laboratory: ?Recent Labs  ?Lab 11/04/21 ?1352  ?WBC 10.8*  ?HGB 14.0  ?HCT 44.4  ?PLT 190  ? ?Recent Labs  ?Lab 11/04/21 ?1352 11/04/21 ?2140 11/05/21 ?0339  ?NA 143 138 136  ?K 4.3 3.9 4.1  ?CL 111 108 104  ?CO2 24 20* 22  ?BUN 44* 39* 37*  ?CREATININE 1.54* 1.36* 1.40*  ?CALCIUM 8.7* 8.4* 8.6*  ?GLUCOSE 186* 300* 208*  ? ? ?Imaging/Diagnostic Tests: ?Transthoracic echo pending ? ?Eppie Gibson, MD ?11/05/2021, 6:38 AM ?PGY-1, [facility] ?[facility] Intern pager: [phone], text pages welcome ? ?

## 2021-11-05 NOTE — TOC Benefit Eligibility Note (Signed)
Patient Advocate Encounter ? ?Insurance verification completed.   ? ?The patient is currently admitted and upon discharge could be taking Eliquis 5 mg. ? ?The current 30 day co-pay is, $38.00.  ? ?The patient is insured through [place] ([facility] is the only [facility] pharmacy that takes [place])  ? ? ? ?Lyndel Safe, CPhT ?Pharmacy Patient Advocate Specialist ?[facility] Patient Advocate Team ?Direct Number: [phone]  Fax: [phone] ? ? ? ? ? ?  ?

## 2021-11-05 NOTE — ED Notes (Signed)
Per Family medicine I have reached out to Cards for weaning process. They instructed to reduce Cardizem to 5 mg/hr and watch for an hr.  If HR remains under 100 bpm then stop cardizem, if not, continue infusing at 5 mg/hr. ?

## 2021-11-05 NOTE — ED Notes (Addendum)
PT noted to have converted rate from 140's to 70's-80's.  Admitting notified. They recommend weaning of Diltiazem but being prepared to restart it if HR goes up again. ?

## 2021-11-05 NOTE — ED Notes (Signed)
Checked pt for dryness and she was noted to have a full brief.  Brief and bedding changed and purewick placed.  No obvious breakdown but skin noted to be a little red.  ?

## 2021-11-05 NOTE — Progress Notes (Signed)
? ?Progress Note ? ?Patient Name: Emily Waller ?Date of Encounter: 11/05/2021 ? ?[facility] Cardiologist: Fransico Him, MD ? ?Subjective  ? ?Remains in atrial flutter with controlled ventricular response on Cardizem and metoprolol ? ?Inpatient Medications  ?  ?Scheduled Meds: ? allopurinol  100 mg Oral Daily  ? apixaban  2.5 mg Oral BID  ? atorvastatin  10 mg Oral QHS  ? metoprolol tartrate  25 mg Oral BID  ? mirtazapine  7.5 mg Oral QHS  ? pantoprazole  40 mg Oral Daily  ? ?Continuous Infusions: ? sodium chloride    ? diltiazem (CARDIZEM) infusion 5 mg/hr (11/05/21 0201)  ? ?PRN Meds: ?traMADol  ? ?Vital Signs  ?  ?Vitals:  ? 11/05/21 0630 11/05/21 0645 11/05/21 0700 11/05/21 0715  ?BP: (!) 127/92 137/74 (!) 131/103 (!) 112/54  ?Pulse: 96 (!) 46 (!) 44 72  ?Resp: 15 19 (!) 24 (!) 23  ?Temp:      ?TempSrc:      ?SpO2: 93% 90% 91% 96%  ?Weight:      ?Height:      ? ? ?Intake/Output Summary (Last 24 hours) at 11/05/2021 0751 ?Last data filed at 11/05/2021 0201 ?Gross per 24 hour  ?Intake 663.26 ml  ?Output --  ?Net 663.26 ml  ? ? ?  11/04/2021  ?  1:06 PM 01/15/2021  ?  7:36 PM 07/11/2018  ?  3:00 AM  ?Last 3 Weights  ?Weight (lbs) 141 lb 12.1 oz 141 lb 12.1 oz 141 lb 12.1 oz  ?Weight (kg) 64.3 kg 64.3 kg 64.3 kg  ?   ? ?Telemetry  ?  ?Atrial fibrillation with controlled ventricular response when resting but quickly goes up into the 110's with speaking- Personally Reviewed ? ?ECG  ?  ?Atrial fibrillation with controlled ventricular sponsor on branch block- Personally Reviewed ? ?Physical Exam  ? ?GEN: No acute distress.   ?Neck: No JVD ?Cardiac: Irregularly irregular, no murmurs, rubs, or gallops.  ?Respiratory: Clear to auscultation bilaterally. ?GI: Soft, nontender, non-distended  ?MS: No edema; No deformity. ?Neuro:  Nonfocal  ?Psych: Normal affect  ? ?Labs  ?  ?High Sensitivity Troponin:   ?Recent Labs  ?Lab 11/04/21 ?1327 11/04/21 ?1533  ?TROPONINIHS 24* 22*  ?   ? ?Chemistry ?Recent Labs  ?Lab 11/04/21 ?1352  11/04/21 ?2140 11/05/21 ?0339  ?NA 143 138 136  ?K 4.3 3.9 4.1  ?CL 111 108 104  ?CO2 24 20* 22  ?GLUCOSE 186* 300* 208*  ?BUN 44* 39* 37*  ?CREATININE 1.54* 1.36* 1.40*  ?CALCIUM 8.7* 8.4* 8.6*  ?GFRNONAA 32* 37* 36*  ?ANIONGAP '8 10 10  '$ ?  ? ?Hematology ?Recent Labs  ?Lab 11/04/21 ?1352  ?WBC 10.8*  ?RBC 4.45  ?HGB 14.0  ?HCT 44.4  ?MCV 99.8  ?MCH 31.5  ?MCHC 31.5  ?RDW 12.9  ?PLT 190  ? ? ?BNPNo results for input(s): BNP, PROBNP in the last 168 hours.  ? ?DDimer No results for input(s): DDIMER in the last 168 hours. ? ? ?CHA2DS2-VASc Score = 5  ?This indicates a 7.2% annual risk of stroke. ?The patient's score is based upon: ?CHF History: 0 ?HTN History: 1 ?Diabetes History: 1 ?Stroke History: 0 ?Vascular Disease History: 0 ?Age Score: 2 ?Gender Score: 1 ?  ?Radiology  ?  ?DG Chest Port 1 View ? ?Result Date: 11/04/2021 ?CLINICAL DATA:  88 year old female with tachycardia EXAM: PORTABLE CHEST 1 VIEW COMPARISON:  01/15/2021 FINDINGS: Cardiomediastinal silhouette unchanged in size and contour. Calcifications of the aortic  arch. Similar appearance of interstitial opacities with reticular pattern in the periphery. No new confluent airspace disease, pneumothorax, or pleural effusion. No displaced fracture IMPRESSION: Unchanged appearance of the chest x-ray with favored chronic changes and no definite evidence of acute cardiopulmonary disease Electronically Signed   By: Corrie Mckusick D.O.   On: 11/04/2021 14:15   ? ?Cardiac Studies  ? ?None ? ?Patient Profile  ?   ?88 y.o. female  with a hx of anxiety and depression, diabetes, hyperlipidemia, hypertension who is being seen today for the evaluation of new onset atrial flutter at the request of Godfrey Pick, MD. ?  ? ?Assessment & Plan  ?  ?New onset atrial flutter with RVR ?-Patient tachycardic and EMS was called to take to the ER for further evaluation ?-Noted to be in SVT at 142 bpm likely atrial flutter with 2 1 conduction with right bundle branch block ?-Patient is  completely asymptomatic ?-TSH normal ?-She has a high CHA2DS2-VASc score of 5 and will need long-term anticoagulation ?-Started yesterday on Eliquis 2.'5mg'$  BID (reduced dose due to SCr > 1.5 and age > 24) ?-she is on scheduled for TEE/DCCV tomorrow ?-continue IV Cardizem gtt increase Lopressor to 25 mg TID ?-2D echo pending ?    :[id number]}   ? 2.  HTN ?-BP controlled on exam today ?-Continue BB and Cardizem gtt ?  ?3.  Elevated Troponin ?-minimally elevated at 24>22 ?-likely demand ischemia in setting of afib with RVR ?-2D echo pending ?   ? ?For questions or updates, please contact [facility] ?Please consult [email or web address] for contact info under  ? ?  ?   ?Signed, ?Fransico Him, MD  ?11/05/2021, 7:51 AM    ?

## 2021-11-05 NOTE — Discharge Planning (Signed)
Pt currently active with [facility] for [place] services as confirmed by [facility] with Freddie Breech of [facility].  Pt will resume [place] services of RN.   ?

## 2021-11-06 ENCOUNTER — Encounter (HOSPITAL_COMMUNITY): Payer: Self-pay | Admitting: General Practice

## 2021-11-06 ENCOUNTER — Encounter (HOSPITAL_COMMUNITY): Payer: Self-pay | Admitting: *Deleted

## 2021-11-06 ENCOUNTER — Inpatient Hospital Stay (HOSPITAL_COMMUNITY): Payer: Medicare Other

## 2021-11-06 DIAGNOSIS — I4891 Unspecified atrial fibrillation: Secondary | ICD-10-CM | POA: Diagnosis not present

## 2021-11-06 LAB — BASIC METABOLIC PANEL
Anion gap: 8 (ref 5–15)
BUN: 37 mg/dL — ABNORMAL HIGH (ref 8–23)
CO2: 24 mmol/L (ref 22–32)
Calcium: 9 mg/dL (ref 8.9–10.3)
Chloride: 107 mmol/L (ref 98–111)
Creatinine, Ser: 1.42 mg/dL — ABNORMAL HIGH (ref 0.44–1.00)
GFR, Estimated: 35 mL/min — ABNORMAL LOW (ref >60)
Glucose, Bld: 176 mg/dL — ABNORMAL HIGH (ref 70–99)
Potassium: 4.3 mmol/L (ref 3.5–5.1)
Sodium: 139 mmol/L (ref 135–145)

## 2021-11-06 LAB — CBC
HCT: 40.9 % (ref 36.0–46.0)
Hemoglobin: 13.3 g/dL (ref 12.0–15.0)
MCH: 31.7 pg (ref 26.0–34.0)
MCHC: 32.5 g/dL (ref 30.0–36.0)
MCV: 97.4 fL (ref 80.0–100.0)
Platelets: 176 10*3/uL (ref 150–400)
RBC: 4.2 MIL/uL (ref 3.87–5.11)
RDW: 12.9 % (ref 11.5–15.5)
WBC: 8.9 10*3/uL (ref 4.0–10.5)
nRBC: 0 % (ref 0.0–0.2)

## 2021-11-06 LAB — BLOOD GAS, VENOUS
Acid-base deficit: 0.7 mmol/L (ref 0.0–2.0)
Bicarbonate: 24.2 mmol/L (ref 20.0–28.0)
Drawn by: 63183
O2 Saturation: 77.8 %
Patient temperature: 36.3
pCO2, Ven: 39 mmHg — ABNORMAL LOW (ref 44–60)
pH, Ven: 7.4 (ref 7.25–7.43)
pO2, Ven: 42 mmHg (ref 32–45)

## 2021-11-06 LAB — GLUCOSE, CAPILLARY: Glucose-Capillary: 180 mg/dL — ABNORMAL HIGH (ref 70–99)

## 2021-11-06 SURGERY — ECHOCARDIOGRAM, TRANSESOPHAGEAL
Anesthesia: Monitor Anesthesia Care

## 2021-11-06 MED ORDER — TRAMADOL HCL 50 MG PO TABS: 50.0000 mg | ORAL_TABLET | Freq: Two times a day (BID) | ORAL | 0 refills | Status: DC | PRN

## 2021-11-06 MED ORDER — APIXABAN 2.5 MG PO TABS: 2.5000 mg | ORAL_TABLET | Freq: Two times a day (BID) | ORAL | 0 refills | Status: DC

## 2021-11-06 MED ORDER — METOPROLOL TARTRATE 25 MG PO TABS: 25.0000 mg | ORAL_TABLET | Freq: Three times a day (TID) | ORAL | 0 refills | Status: DC

## 2021-11-06 NOTE — Discharge Summary (Signed)
Family Medicine Teaching Service ?Hospital Discharge Summary ? ?Patient name: Emily Waller Medical record number: [id number] ?Date of birth: [birth year] Age: 88 y.o. Gender: female ?Date of Admission: 11/04/2021  Date of Discharge: 11/06/2021 ?Admitting Physician: Pearla Dubonnet, MD ? ?Primary Care Provider: Patient, No Pcp Per (Inactive) ?Consultants: Cardiology ? ?Indication for Hospitalization: Tachycardia ? ?Discharge Diagnoses/Problem List:  ?Atrial Flutter ? ?Disposition: Back to memory care unit ? ?Discharge Condition: Stable, at baseline ? ?Discharge Exam:  ?Gen: Sleeping on arrival, awakens to voice and tactile stimuli and answers questions with 1-2 word answers ?Cardio: Normal rate, regular rhythm, no murmurs, 2+ pulses throughout ?Resp: Normal WOB on RA, lungs are clear throughout ?Derm: Skin warm and dry ?Ext: Without edema or deformity, bruising present but appropriate for age ? ?Brief Hospital Course:  ?Emily Waller is a 88 y.o. female presenting with new-onset atrial flutter. PMH is significant for Alzheimer's disease, hypertension, Gout, hyperlipidemia, diabetes, and anxiety/depression. ? ?New Onset Atrial Flutter ?Patient presented via EMS after being found tachycardic at her home memory care unit.  Heart rate on arrival was 142 and ECG showed atrial flutter with a 2-1 conduction and right bundle branch block.  Troponins were slightly elevated but flat at 24 > 22.  Patient was started on a diltiazem drip and cardiology was consulted.  Cardiology recommended addition of Lopressor and initiation of Eliquis for a CHA2DS2-VASc of 5.  TTE was obtained which showed severe LVH with EF of 55 to 60% without thrombus.  She was rate controlled reasonably well on metoprolol '25mg'$  q8h.  Due to adequate rate control and her advanced age, decision was made not to pursue TEE cardioversion.  ? ?Recent History of UTI ?Patient presented on day 4/5 of Macrobid therapy for a UTI. Discontinued Macrobid during  hospitalization prior to completing course due to decreased renal function. Mild leukocytosis on admission that appropriately down trended without other infectious/systemic symptoms during stay.   ? ?Alzheimer's Disease ?Patient was pleasantly demented on arrival.  She was maintained on delirium and fall precautions and did well. ? ?Items for PCP follow-up ?Patient will require long-term anticoagulation due to CHA2DS2-VASc score of 5. Discharged on Eliquis 2.'5mg'$  BID ?Monitor for UTI symptoms ?Continue to titrate metoprolol as needed for rate control ?Monitor BP, blood pressures were stable with discontinuation of amlodipine and initiation of metoprolol.  ? ? ?Significant Procedures: None ? ?Significant Labs and Imaging:  ?Recent Labs  ?Lab 11/04/21 ?1352 11/06/21 ?9528  ?WBC 10.8* 8.9  ?HGB 14.0 13.3  ?HCT 44.4 40.9  ?PLT 190 176  ? ?Recent Labs  ?Lab 11/04/21 ?1352 11/04/21 ?2140 11/05/21 ?4132 11/06/21 ?4401  ?NA 143 138 136 139  ?K 4.3 3.9 4.1 4.3  ?CL 111 108 104 107  ?CO2 24 20* 22 24  ?GLUCOSE 186* 300* 208* 176*  ?BUN 44* 39* 37* 37*  ?CREATININE 1.54* 1.36* 1.40* 1.42*  ?CALCIUM 8.7* 8.4* 8.6* 9.0  ?MG 2.0  --  1.8  --   ? ? ?ECHOCARDIOGRAM COMPLETE ?   ECHOCARDIOGRAM REPORT   ? ?  ? ?Patient Name:   Emily Waller Date of Exam: 11/05/2021 ?Medical Rec #:  [id number]      Height:       68.0 in ?Accession #:    [id number]     Weight:       141.8 lb ?Date of Birth:  January 21, 1928     BSA:          1.766 m? ?Patient Age:  89 years       BP:           135/124 mmHg ?Patient Gender: F              HR:           95 bpm. ?Exam Location:  Inpatient ? ?Procedure: 2D Echo, Cardiac Doppler and Color Doppler ? ?Indications:    CVA ?  ?History:        Patient has no prior history of Echocardiogram examinations. ?                Risk Factors:Hypertension and Diabetes. ?  ?Sonographer:    TAMARA CROWN RDCS ?Referring Phys: [place] ? ?  ?Sonographer Comments: Suboptimal apical window. Image acquisition challenging  due to patient body habitus. ?IMPRESSIONS ? ? 1. Left ventricular ejection fraction, by estimation, is 55 to 60%. The left ventricle has normal function. The left ventricle has no regional wall motion abnormalities. There is severe concentric left ventricular hypertrophy. Left ventricular diastolic ? function could not be evaluated. ? 2. Right ventricular systolic function is normal. The right ventricular size is normal. There is mildly elevated pulmonary artery systolic pressure. The estimated right ventricular systolic pressure is 37.4 mmHg. ? 3. Left atrial size was severely dilated. ? 4. The mitral valve is degenerative. Mild mitral valve regurgitation. No evidence of mitral stenosis. ? 5. The aortic valve is tricuspid. Aortic valve regurgitation is mild. No aortic stenosis is present. ? 6. The inferior vena cava is normal in size with greater than 50% respiratory variability, suggesting right atrial pressure of 3 mmHg. ? ?Conclusion(s)/Recommendation(s): No intracardiac source of embolism detected on this transthoracic study. Consider a transesophageal echocardiogram to exclude cardiac source of embolism if clinically indicated. ? ?FINDINGS ? Left Ventricle: Left ventricular ejection fraction, by estimation, is 55 to 60%. The left ventricle has normal function. The left ventricle has no regional wall motion abnormalities. The left ventricular internal cavity size was normal in size. There is ? severe concentric left ventricular hypertrophy. Left ventricular diastolic function could not be evaluated due to atrial fibrillation. Left ventricular diastolic function could not be evaluated. ? ?Right Ventricle: The right ventricular size is normal. No increase in right ventricular wall thickness. Right ventricular systolic function is normal. There is mildly elevated pulmonary artery systolic pressure. The tricuspid regurgitant velocity is 2.95 ? m/s, and with an assumed right atrial pressure of 3 mmHg, the estimated  right ventricular systolic pressure is 82.7 mmHg. ? ?Left Atrium: Left atrial size was severely dilated. ? ?Right Atrium: Right atrial size was normal in size. ? ?Pericardium: There is no evidence of pericardial effusion. ? ?Mitral Valve: The mitral valve is degenerative in appearance. Mild mitral valve regurgitation. No evidence of mitral valve stenosis. ? ?Tricuspid Valve: The tricuspid valve is grossly normal. Tricuspid valve regurgitation is mild . No evidence of tricuspid stenosis. ? ?Aortic Valve: The aortic valve is tricuspid. Aortic valve regurgitation is mild. Aortic regurgitation PHT measures 433 msec. No aortic stenosis is present. Aortic valve mean gradient measures 3.0 mmHg. Aortic valve peak gradient measures 5.3 mmHg. Aortic ? valve area, by VTI measures 1.74 cm?. ? ?Pulmonic Valve: The pulmonic valve was grossly normal. Pulmonic valve regurgitation is mild. No evidence of pulmonic stenosis. ? ?Aorta: The aortic root and ascending aorta are structurally normal, with no evidence of dilitation. ? ?Venous: The inferior vena cava is normal in size with greater than 50% respiratory variability, suggesting right atrial pressure  of 3 mmHg. ? ?IAS/Shunts: The atrial septum is grossly normal. ? ?  ?LEFT VENTRICLE ?PLAX 2D ?LVIDd:         3.30 cm ?LVIDs:         2.50 cm ?LV PW:         1.70 cm ?LV IVS:        2.00 cm ?LVOT diam:     1.70 cm ?LV SV:         39 ?LV SV Index:   22 ?LVOT Area:     2.27 cm? ?  ?LV Volumes (MOD) ?LV vol d, MOD A2C: 26.3 ml ?LV vol d, MOD A4C: 38.3 ml ?LV vol s, MOD A2C: 15.1 ml ?LV vol s, MOD A4C: 16.6 ml ?LV SV MOD A2C:     11.2 ml ?LV SV MOD A4C:     38.3 ml ?LV SV MOD BP:      15.8 ml ? ?RIGHT VENTRICLE ?RV Basal diam:  3.30 cm ?RV Mid diam:    1.90 cm ?RV S prime:     8.05 cm/s ?TAPSE (M-mode): 1.3 cm ? ?LEFT ATRIUM             Index        RIGHT ATRIUM           Index ?LA diam:        3.50 cm 1.98 cm/m?   RA Area:     13.30 cm? ?LA Vol (A2C):   35.0 ml 19.82 ml/m?  RA Volume:    30.80 ml  17.44 ml/m? ?LA Vol (A4C):   84.2 ml 47.69 ml/m? ?LA Biplane Vol: 56.8 ml 32.17 ml/m? ? AORTIC VALVE                    PULMONIC VALVE ?AV Area (Vmax):    1.91 cm?     PV Vmax:          0.97 m/s ?AV Area

## 2021-11-06 NOTE — Progress Notes (Signed)
Left message to daughter to call back to give consent for mother to have cardioversion today.  ?

## 2021-11-06 NOTE — Progress Notes (Signed)
Staff RN paged reporting patient's daughter has question for cardiology. Spoke to patient's daughter Emily Waller over the phone. She had question about atrial flutter and its recurrence, TEE with cardioversion ,  wondering if this is the right choice on cancelling it. She mentioned that patient is 88 yr old lives in a dementia unit with limited quality of life. We have reviewed the risk versus benefit of TEE with cardioversion, given patient is largely asymptomatic from A flutter and also rate fairly controlled on beta blocker, it's reasonable to continue current medical management and monitor patient for symptoms for now. Informed her that patient also has appointment on 11/13/21 with [facility] fib clinic for re-evaluation of atrial flutter, if she changes her mind on TEE /cardioversion and the procedure is needed, we can re-arrange then. We discussed the nature of a ib/flutter. All questions answered to satisfaction.  ?

## 2021-11-06 NOTE — Progress Notes (Signed)
Family Medicine Teaching Service ?Daily Progress Note ?Intern Pager: [phone] ? ?Patient name: Emily Waller Medical record number: [id number] ?Date of birth: [birth year] Age: 88 y.o. Gender: female ? ?Primary Care Provider: Patient, No Pcp Per (Inactive) ?Consultants: Cardiology ?Code Status: DNR ? ?Pt Overview and Major Events to Date:  ?4/18- admitted ? ?Assessment and Plan: ?Emily Waller is a 88 y.o. female presenting with new-onset atrial flutter. PMH is significant for Alzheimer's disease, hypertension, Gout, hyperlipidemia, diabetes, and anxiety/depression. ? ?New Onset Atrial Flutter ?Patient remains in atrial flutter this morning.  Heart rate has been generally well controlled but occasionally spikes to 100-120, seems to be exacerbated by activity.  Transitioned off of diltiazem by cardiology yesterday, metoprolol tartrate increased to 25 mg every 8 hours.  TTE yesterday with LVEF 55 to 60%, severe dilatation of left atrium, mild mitral and aortic regurgitation.  No evidence of thrombus formation. ?-Cardiology following, appreciate assistance ?-Continue metoprolol tartrate 25 mg every 8 hours ?-Anticipate that she may be taken for TEE cardioversion today per cardiology ?-N.p.o. ahead of possible procedure ?-Continue Eliquis 2.5 mg twice daily ? ?HTN ?BP generally well-controlled on metoprolol at this time. ?-Holding home amlodipine ?-Metoprolol as above ? ?Alzheimer's Disease ?Given baseline dementia and advanced age, do not believe she is a great candidate for overly aggressive interventions. ?-Delirium and fall precautions ?-Plan for discharge back to SNF ? ?Chronic stable conditions ?Anxiety/Depression- Mirtazapine 7.'5mg'$  QHS ?HLD- lipitor '10mg'$  QHS ?Gout- allopurinol  ?Chronic pain- tramadol '50mg'$  q12h PRN ? ?FEN/GI: N.p.o., resume regular diet when able ?PPx: On Eliquis ?Dispo:SNF tomorrow. Barriers include rate control.  ? ?Subjective:  ?Emily Waller was sleeping on arrival, upon waking she continues to  be pleasant, continues to deny any symptoms. ? ?Objective: ?Temp:  [97.9 ?F (36.6 ?C)-98 ?F (36.7 ?C)] 98 ?F (36.7 ?C) (04/19 4097) ?Pulse Rate:  [28-145] 58 (04/19 0438) ?Resp:  [16-28] 20 (04/19 0438) ?BP: (103-143)/(44-124) 121/102 (04/19 0438) ?SpO2:  [90 %-96 %] 95 % (04/19 0438) ?Physical Exam: ?General: Frail, has dementia ?Cardiovascular: Regular rate while sleeping, becomes tachycardic upon waking, no murmur ?Respiratory: Breathing comfortably on room air, clear lungs throughout ?Abdomen: Soft and nontender ?Extremities: Without edema or deformity ? ?Laboratory: ?Recent Labs  ?Lab 11/04/21 ?1352 11/06/21 ?3532  ?WBC 10.8* 8.9  ?HGB 14.0 13.3  ?HCT 44.4 40.9  ?PLT 190 176  ? ?Recent Labs  ?Lab 11/04/21 ?1352 11/04/21 ?2140 11/05/21 ?0339  ?NA 143 138 136  ?K 4.3 3.9 4.1  ?CL 111 108 104  ?CO2 24 20* 22  ?BUN 44* 39* 37*  ?CREATININE 1.54* 1.36* 1.40*  ?CALCIUM 8.7* 8.4* 8.6*  ?GLUCOSE 186* 300* 208*  ? ? ?Imaging/Diagnostic Tests: ?ECHOCARDIOGRAM COMPLETE ?   ECHOCARDIOGRAM REPORT   ? ?  ? ?Patient Name:   Emily Waller Date of Exam: 11/05/2021 ?Medical Rec #:  [id number]      Height:       68.0 in ?Accession #:    [id number]     Weight:       141.8 lb ?Date of Birth:  1927/12/13     BSA:          1.766 m? ?Patient Age:    34 years       BP:           135/124 mmHg ?Patient Gender: F              HR:           95 bpm. ?Exam Location:  Inpatient ? ?Procedure: 2D Echo, Cardiac Doppler and Color Doppler ? ?Indications:    CVA ?  ?History:        Patient has no prior history of Echocardiogram examinations. ?                Risk Factors:Hypertension and Diabetes. ?  ?Sonographer:    TAMARA CROWN RDCS ?Referring Phys: [place] ? ?  ?Sonographer Comments: Suboptimal apical window. Image acquisition challenging due to patient body habitus. ?IMPRESSIONS ? ? 1. Left ventricular ejection fraction, by estimation, is 55 to 60%. The left ventricle has normal function. The left ventricle has no regional wall  motion abnormalities. There is severe concentric left ventricular hypertrophy. Left ventricular diastolic ? function could not be evaluated. ? 2. Right ventricular systolic function is normal. The right ventricular size is normal. There is mildly elevated pulmonary artery systolic pressure. The estimated right ventricular systolic pressure is 81.8 mmHg. ? 3. Left atrial size was severely dilated. ? 4. The mitral valve is degenerative. Mild mitral valve regurgitation. No evidence of mitral stenosis. ? 5. The aortic valve is tricuspid. Aortic valve regurgitation is mild. No aortic stenosis is present. ? 6. The inferior vena cava is normal in size with greater than 50% respiratory variability, suggesting right atrial pressure of 3 mmHg. ? ?Conclusion(s)/Recommendation(s): No intracardiac source of embolism detected on this transthoracic study. Consider a transesophageal echocardiogram to exclude cardiac source of embolism if clinically indicated. ? ?FINDINGS ? Left Ventricle: Left ventricular ejection fraction, by estimation, is 55 to 60%. The left ventricle has normal function. The left ventricle has no regional wall motion abnormalities. The left ventricular internal cavity size was normal in size. There is ? severe concentric left ventricular hypertrophy. Left ventricular diastolic function could not be evaluated due to atrial fibrillation. Left ventricular diastolic function could not be evaluated. ? ?Right Ventricle: The right ventricular size is normal. No increase in right ventricular wall thickness. Right ventricular systolic function is normal. There is mildly elevated pulmonary artery systolic pressure. The tricuspid regurgitant velocity is 2.95 ? m/s, and with an assumed right atrial pressure of 3 mmHg, the estimated right ventricular systolic pressure is 29.9 mmHg. ? ?Left Atrium: Left atrial size was severely dilated. ? ?Right Atrium: Right atrial size was normal in size. ? ?Pericardium: There is no  evidence of pericardial effusion. ? ?Mitral Valve: The mitral valve is degenerative in appearance. Mild mitral valve regurgitation. No evidence of mitral valve stenosis. ? ?Tricuspid Valve: The tricuspid valve is grossly normal. Tricuspid valve regurgitation is mild . No evidence of tricuspid stenosis. ? ?Aortic Valve: The aortic valve is tricuspid. Aortic valve regurgitation is mild. Aortic regurgitation PHT measures 433 msec. No aortic stenosis is present. Aortic valve mean gradient measures 3.0 mmHg. Aortic valve peak gradient measures 5.3 mmHg. Aortic ? valve area, by VTI measures 1.74 cm?. ? ?Pulmonic Valve: The pulmonic valve was grossly normal. Pulmonic valve regurgitation is mild. No evidence of pulmonic stenosis. ? ?Aorta: The aortic root and ascending aorta are structurally normal, with no evidence of dilitation. ? ?Venous: The inferior vena cava is normal in size with greater than 50% respiratory variability, suggesting right atrial pressure of 3 mmHg. ? ?IAS/Shunts: The atrial septum is grossly normal. ? ?  ?LEFT VENTRICLE ?PLAX 2D ?LVIDd:         3.30 cm ?LVIDs:         2.50 cm ?LV PW:         1.70 cm ?LV  IVS:        2.00 cm ?LVOT diam:     1.70 cm ?LV SV:         39 ?LV SV Index:   22 ?LVOT Area:     2.27 cm? ?  ?LV Volumes (MOD) ?LV vol d, MOD A2C: 26.3 ml ?LV vol d, MOD A4C: 38.3 ml ?LV vol s, MOD A2C: 15.1 ml ?LV vol s, MOD A4C: 16.6 ml ?LV SV MOD A2C:     11.2 ml ?LV SV MOD A4C:     38.3 ml ?LV SV MOD BP:      15.8 ml ? ?RIGHT VENTRICLE ?RV Basal diam:  3.30 cm ?RV Mid diam:    1.90 cm ?RV S prime:     8.05 cm/s ?TAPSE (M-mode): 1.3 cm ? ?LEFT ATRIUM             Index        RIGHT ATRIUM           Index ?LA diam:        3.50 cm 1.98 cm/m?   RA Area:     13.30 cm? ?LA Vol (A2C):   35.0 ml 19.82 ml/m?  RA Volume:   30.80 ml  17.44 ml/m? ?LA Vol (A4C):   84.2 ml 47.69 ml/m? ?LA Biplane Vol: 56.8 ml 32.17 ml/m? ? AORTIC VALVE                    PULMONIC VALVE ?AV Area (Vmax):    1.91 cm?     PV Vmax:           0.97 m/s ?AV Area (Vmean):   1.81 cm?     PV Vmean:         67.125 cm/s ?AV Area (VTI):     1.74 cm?     PV VTI:           0.194 m ?AV Vmax:           115.00 cm/s  PV Peak grad:     3.8 mmHg ?AV Vmean:

## 2021-11-06 NOTE — [place] FL2 (Addendum)
?[place] MEDICAID FL2 LEVEL OF CARE SCREENING TOOL  ?  ? ?IDENTIFICATION  ?Patient Name: ?Emily Waller Birthdate: 02-19-1928 Sex: female Admission Date (Current Location): ?11/04/2021  ?[place] and [place] Number: ? [place] ?  Facility and Address:  ?[facility], [place] [address] [place], [place] [address] ?     Provider Number: ?[id number]  ?Attending Physician Name and Address:  ?Lenoria Chime, MD ? Relative Name and Phone Number:  ?[name] [phone] ?   ?Current Level of Care: ?Hospital Recommended Level of Care: ?[facility], [facility] Care (From [place]) Prior Approval Number: ?  ? ?Date Approved/Denied: ?  PASRR Number: ?  ? ?Discharge Plan: ?Other (Comment) ([facility] memory care) ?  ? ?Current Diagnoses: ?Patient Active Problem List  ? Diagnosis Date Noted  ? Atrial fibrillation with RVR ([place]) 11/04/2021  ? Atrial flutter with rapid ventricular response ([place])   ? Symptomatic bradycardia 07/07/2018  ? Renal insufficiency 07/07/2018  ? Diet-controlled diabetes mellitus ([place]) 07/07/2018  ? Itching 09/12/2015  ? Diabetes ([place]) 09/12/2015  ? HTN (hypertension) 09/12/2015  ? Depression 09/12/2015  ? ? ?Orientation RESPIRATION BLADDER Height & Weight   ?  ?Self ? Normal Incontinent, External catheter (External Urinary Catheter) Weight: 141 lb 12.1 oz (64.3 kg) ?Height:  '5\' 8"'$  (172.7 cm)  ?BEHAVIORAL SYMPTOMS/MOOD NEUROLOGICAL BOWEL NUTRITION STATUS  ?    Continent (WDL) Diet: Regular   ?AMBULATORY STATUS COMMUNICATION OF NEEDS Skin   ?  Verbally Other (Comment) (Appropriate for ethnicity,dry,intact, non-tenting) ?  ?  ?  ?    ?     ?     ? ? ?Personal Care Assistance Level of Assistance  ?Bathing, Feeding, Dressing Bathing Assistance: Limited assistance ?Feeding assistance: Limited assistance (Able to feed self, Needs assist sitting up) ?Dressing Assistance: Limited assistance ?   ? ?Functional Limitations Info  ?Sight, Hearing, Speech Sight  Info: Adequate ?Hearing Info: Impaired ?Speech Info: Adequate  ? ? ?SPECIAL CARE FACTORS FREQUENCY  ?    ?  ?  ?  ?  ?  ?  ?   ? ? ?Contractures Contractures Info: Not present  ? ? ?Additional Factors Info  ?Code Status, Allergies, Psychotropic Code Status Info: DNR ?Allergies Info: Colcrys (colchicine),Lovastatin ?Psychotropic Info: mirtazapine (REMERON) tablet 7.5 mg daily at bedtime ?  ?  ?   ? ? ?Allergies as of 11/06/2021   ? ?   Reactions  ? Colcrys [colchicine] Nausea Only  ? Stomach upset  ? Lovastatin Other (See Comments)  ? myalgia  ? ?  ? ?  ?Medication List  ?  ? ? ?  ? ?TAKE these medications   ? ?acetaminophen 325 MG tablet ?Commonly known as: TYLENOL ?Take 650 mg by mouth 2 (two) times daily. ?  ?allopurinol 100 MG tablet ?Commonly known as: ZYLOPRIM ?Take 100 mg by mouth daily. ?  ?apixaban 2.5 MG Tabs tablet ?Commonly known as: ELIQUIS ?Take 1 tablet (2.5 mg total) by mouth 2 (two) times daily. ?  ?atorvastatin 10 MG tablet ?Commonly known as: LIPITOR ?Take 10 mg by mouth at bedtime. ?  ?metoprolol tartrate 25 MG tablet ?Commonly known as: LOPRESSOR ?Take 1 tablet (25 mg total) by mouth every 8 (eight) hours. ?  ?mirtazapine 7.5 MG tablet ?Commonly known as: REMERON ?Take 7.5 mg by mouth at bedtime. ?  ?nystatin ointment ?Commonly known as: MYCOSTATIN ?Apply 1 application. topically 2 (two) times daily. Left Breast ?  ?pantoprazole 40  MG tablet ?Commonly known as: PROTONIX ?Take 40 mg by mouth daily. ?  ?traMADol 50 MG tablet ?Commonly known as: ULTRAM ?Take 1 tablet (50 mg total) by mouth every 12 (twelve) hours as needed. ?What changed: when to take this ?  ?Turmeric 500 MG Caps ?Take 500 mg by mouth daily. ?  ?Vitamin D (Ergocalciferol) 1.25 MG (50000 UNIT) Caps capsule ?Commonly known as: DRISDOL ?Take 50,000 Units by mouth every Tuesday. ?  ? ?  ? ?Relevant Imaging Results: ? ?Relevant Lab Results: ? ? ?Additional Information ?SSN-[id number] ? ?Milas Gain, LCSWA ? ? ? ? ?

## 2021-11-06 NOTE — Progress Notes (Signed)
Pt had 2.17 sec pause in heart rate this AM. Paged cardiology to advise. Blood pressure is also starting to slowly decrease from SBP of 120's to 98 currently. Pt is still very sleepy. Heart rate is currently in the low 100's.  ?

## 2021-11-06 NOTE — Progress Notes (Signed)
FPTS Brief Progress Note ? ?S: Patient is having trouble sleeping but is otherwise without complaint.  ? ? ?O: ?BP 117/81 (BP Location: Left Arm)   Pulse (!) 125   Temp 97.9 ?F (36.6 ?C) (Oral)   Resp 20   Ht '5\' 8"'$  (1.727 m)   Wt 64.3 kg   SpO2 92%   BMI 21.55 kg/m?   ?Gen: NAD, pleasant, elderly  ?Resp: Normal rise and fall of chest  ?Neuro: Tangential, same as previous examination  ? ?A/P: ?Patient off of dilt gtt after metoprolol administration. Will monitor HR overnight. Hopeful to go home soon with oral rate controlling medications. Alzheimer's stable. Plan per day team.  ? ? ?Erskine Emery, MD ?11/06/2021, 12:36 AM ?PGY-1, [place] [facility] Night Resident  ?Please page [phone] with questions.  ? ? ?

## 2021-11-06 NOTE — Progress Notes (Signed)
? ?Progress Note ? ?Patient Name: Emily Waller ?Date of Encounter: 11/06/2021 ? ?[facility] Cardiologist: Fransico Him, MD ? ?Subjective  ? ?Remains in atrial flutter with controlled ventricular response at rest but when she is moving around any in the bed her heart rate goes up in the 120s.  Very sleepy this am. ? ?Inpatient Medications  ?  ?Scheduled Meds: ? allopurinol  100 mg Oral Daily  ? apixaban  2.5 mg Oral BID  ? atorvastatin  10 mg Oral QHS  ? metoprolol tartrate  25 mg Oral Q8H  ? mirtazapine  7.5 mg Oral QHS  ? pantoprazole  40 mg Oral Daily  ? ?Continuous Infusions: ? sodium chloride    ? ?PRN Meds: ?traMADol  ? ?Vital Signs  ?  ?Vitals:  ? 11/06/21 0033 11/06/21 0034 11/06/21 2620 11/06/21 0438  ?BP: 117/81  (!) 143/95 (!) 121/102  ?Pulse: (!) 125 73 (!) 122 (!) 58  ?Resp: '20 20 20 20  '$ ?Temp: 97.9 ?F (36.6 ?C)   98 ?F (36.7 ?C)  ?TempSrc: Oral   Oral  ?SpO2: 92% 93% 96% 95%  ?Weight:      ?Height:      ? ? ?Intake/Output Summary (Last 24 hours) at 11/06/2021 0741 ?Last data filed at 11/06/2021 [phone] ?Gross per 24 hour  ?Intake 380.76 ml  ?Output 400 ml  ?Net -19.24 ml  ? ? ? ?  11/04/2021  ?  1:06 PM 01/15/2021  ?  7:36 PM 07/11/2018  ?  3:00 AM  ?Last 3 Weights  ?Weight (lbs) 141 lb 12.1 oz 141 lb 12.1 oz 141 lb 12.1 oz  ?Weight (kg) 64.3 kg 64.3 kg 64.3 kg  ?   ? ?Telemetry  ?  ?Atrial fibrillation with mainly controlled ventricular response but it does increase into the 120s when she is moving in the bed- Personally Reviewed ? ?ECG  ?  ?Atrial fibrillation with controlled ventricular sponsor on branch block- Personally Reviewed ? ?Physical Exam  ? ?GEN: Well nourished, well developed in no acute distress ?HEENT: Normal ?NECK: No JVD; No carotid bruits ?LYMPHATICS: No lymphadenopathy ?CARDIAC: Irregularly tacky no murmurs, rubs, gallops ?RESPIRATORY:  Clear to auscultation without rales, wheezing or rhonchi  ?ABDOMEN: Soft, non-tender, non-distended ?MUSCULOSKELETAL:  No edema; No deformity  ?SKIN:  Warm and dry ?NEUROLOGIC:  Alert and oriented x 3 ?PSYCHIATRIC:  Normal affect   ?Labs  ?  ?High Sensitivity Troponin:   ?Recent Labs  ?Lab 11/04/21 ?1327 11/04/21 ?1533  ?TROPONINIHS 24* 22*  ? ?   ? ?Chemistry ?Recent Labs  ?Lab 11/04/21 ?1352 11/04/21 ?2140 11/05/21 ?0339  ?NA 143 138 136  ?K 4.3 3.9 4.1  ?CL 111 108 104  ?CO2 24 20* 22  ?GLUCOSE 186* 300* 208*  ?BUN 44* 39* 37*  ?CREATININE 1.54* 1.36* 1.40*  ?CALCIUM 8.7* 8.4* 8.6*  ?GFRNONAA 32* 37* 36*  ?ANIONGAP '8 10 10  '$ ? ?  ? ?Hematology ?Recent Labs  ?Lab 11/04/21 ?1352 11/06/21 ?7416  ?WBC 10.8* 8.9  ?RBC 4.45 4.20  ?HGB 14.0 13.3  ?HCT 44.4 40.9  ?MCV 99.8 97.4  ?MCH 31.5 31.7  ?MCHC 31.5 32.5  ?RDW 12.9 12.9  ?PLT 190 176  ? ? ? ?BNPNo results for input(s): BNP, PROBNP in the last 168 hours.  ? ?DDimer No results for input(s): DDIMER in the last 168 hours. ? ? ?CHA2DS2-VASc Score = 5  ?This indicates a 7.2% annual risk of stroke. ?The patient's score is based upon: ?CHF History: 0 ?HTN  History: 1 ?Diabetes History: 1 ?Stroke History: 0 ?Vascular Disease History: 0 ?Age Score: 2 ?Gender Score: 1 ?  ?Radiology  ?  ?DG Chest Port 1 View ? ?Result Date: 11/04/2021 ?CLINICAL DATA:  88 year old female with tachycardia EXAM: PORTABLE CHEST 1 VIEW COMPARISON:  01/15/2021 FINDINGS: Cardiomediastinal silhouette unchanged in size and contour. Calcifications of the aortic arch. Similar appearance of interstitial opacities with reticular pattern in the periphery. No new confluent airspace disease, pneumothorax, or pleural effusion. No displaced fracture IMPRESSION: Unchanged appearance of the chest x-ray with favored chronic changes and no definite evidence of acute cardiopulmonary disease Electronically Signed   By: Corrie Mckusick D.O.   On: 11/04/2021 14:15  ? ?ECHOCARDIOGRAM COMPLETE ? ?Result Date: 11/05/2021 ?   ECHOCARDIOGRAM REPORT   Patient Name:   Emily Waller Date of Exam: 11/05/2021 Medical Rec #:  [id number]      Height:       68.0 in Accession #:     [id number]     Weight:       141.8 lb Date of Birth:  11-19-1927     BSA:          1.766 m? Patient Age:    88 years       BP:           135/124 mmHg Patient Gender: F              HR:           95 bpm. Exam Location:  Inpatient Procedure: 2D Echo, Cardiac Doppler and Color Doppler Indications:    CVA  History:        Patient has no prior history of Echocardiogram examinations.                 Risk Factors:Hypertension and Diabetes.  Sonographer:    Luisa Hart RDCS Referring Phys: 49 Righteous Claiborne R Okla Qazi  Sonographer Comments: Suboptimal apical window. Image acquisition challenging due to patient body habitus. IMPRESSIONS  1. Left ventricular ejection fraction, by estimation, is 55 to 60%. The left ventricle has normal function. The left ventricle has no regional wall motion abnormalities. There is severe concentric left ventricular hypertrophy. Left ventricular diastolic  function could not be evaluated.  2. Right ventricular systolic function is normal. The right ventricular size is normal. There is mildly elevated pulmonary artery systolic pressure. The estimated right ventricular systolic pressure is 86.5 mmHg.  3. Left atrial size was severely dilated.  4. The mitral valve is degenerative. Mild mitral valve regurgitation. No evidence of mitral stenosis.  5. The aortic valve is tricuspid. Aortic valve regurgitation is mild. No aortic stenosis is present.  6. The inferior vena cava is normal in size with greater than 50% respiratory variability, suggesting right atrial pressure of 3 mmHg. Conclusion(s)/Recommendation(s): No intracardiac source of embolism detected on this transthoracic study. Consider a transesophageal echocardiogram to exclude cardiac source of embolism if clinically indicated. FINDINGS  Left Ventricle: Left ventricular ejection fraction, by estimation, is 55 to 60%. The left ventricle has normal function. The left ventricle has no regional wall motion abnormalities. The left ventricular internal  cavity size was normal in size. There is  severe concentric left ventricular hypertrophy. Left ventricular diastolic function could not be evaluated due to atrial fibrillation. Left ventricular diastolic function could not be evaluated. Right Ventricle: The right ventricular size is normal. No increase in right ventricular wall thickness. Right ventricular systolic function is normal. There is mildly elevated pulmonary artery systolic pressure.  The tricuspid regurgitant velocity is 2.95  m/s, and with an assumed right atrial pressure of 3 mmHg, the estimated right ventricular systolic pressure is 74.0 mmHg. Left Atrium: Left atrial size was severely dilated. Right Atrium: Right atrial size was normal in size. Pericardium: There is no evidence of pericardial effusion. Mitral Valve: The mitral valve is degenerative in appearance. Mild mitral valve regurgitation. No evidence of mitral valve stenosis. Tricuspid Valve: The tricuspid valve is grossly normal. Tricuspid valve regurgitation is mild . No evidence of tricuspid stenosis. Aortic Valve: The aortic valve is tricuspid. Aortic valve regurgitation is mild. Aortic regurgitation PHT measures 433 msec. No aortic stenosis is present. Aortic valve mean gradient measures 3.0 mmHg. Aortic valve peak gradient measures 5.3 mmHg. Aortic  valve area, by VTI measures 1.74 cm?. Pulmonic Valve: The pulmonic valve was grossly normal. Pulmonic valve regurgitation is mild. No evidence of pulmonic stenosis. Aorta: The aortic root and ascending aorta are structurally normal, with no evidence of dilitation. Venous: The inferior vena cava is normal in size with greater than 50% respiratory variability, suggesting right atrial pressure of 3 mmHg. IAS/Shunts: The atrial septum is grossly normal.  LEFT VENTRICLE PLAX 2D LVIDd:         3.30 cm LVIDs:         2.50 cm LV PW:         1.70 cm LV IVS:        2.00 cm LVOT diam:     1.70 cm LV SV:         39 LV SV Index:   22 LVOT Area:     2.27  cm?  LV Volumes (MOD) LV vol d, MOD A2C: 26.3 ml LV vol d, MOD A4C: 38.3 ml LV vol s, MOD A2C: 15.1 ml LV vol s, MOD A4C: 16.6 ml LV SV MOD A2C:     11.2 ml LV SV MOD A4C:     38.3 ml LV SV MOD BP:      15.8

## 2021-11-06 NOTE — Progress Notes (Signed)
Report called and given to pt's facility.  ?

## 2021-11-06 NOTE — TOC Transition Note (Signed)
Transition of Care (TOC) - CM/SW Discharge Note ? ? ?Patient Details  ?Name: Emily Waller ?MRN: [id number] ?Date of Birth: Apr 16, 1928 ? ?Transition of Care (TOC) CM/SW Contact:  ?Milas Gain, LCSWA ?Phone Number: ?11/06/2021, 2:36 PM ? ? ?Clinical Narrative:    ? ?Patient will DC to: [facility] ? ?Anticipated DC date: 11/06/2021 ? ?Family notified: Emily Waller  ? ?Transport by: Emily Waller ? ?? ? ?Per MD patient ready for DC to [facility] memory care. RN, patient, patient's family, and facility notified of DC. Discharge Summary and FL2 sent to facility. RN given number for report tele# [phone]. DC packet on chart. DNR attached to patients hard chart signed by MD.Ambulance transport requested for patient. ? ?CSW signing off.  ? ?  ?Barriers to Discharge: Continued Medical Work up ? ? ?Patient Goals and CMS Choice ?  ?CMS Medicare.gov Compare Post Acute Care list provided to:: Patient Represenative (must comment) (Patients daughter Emily Waller) ?Choice offered to / list presented to : Adult Children (Patients daughter Emily Waller) ? ?Discharge Placement ?  ?           ?  ?  ?  ?  ? ?Discharge Plan and Services ?In-house Referral: Clinical Social Work ?  ?           ?  ?  ?  ?  ?  ?  ?  ?  ?  ?  ? ?Social Determinants of Health (SDOH) Interventions ?  ? ? ?Readmission Risk Interventions ?   ? View : No data to display.  ?  ?  ?  ? ? ? ? ? ?

## 2021-11-06 NOTE — Progress Notes (Signed)
Patient remains very lethargic which her family states she will certainly sleep.  Primary service has talked with daughter and daughter does not want to proceed with TEE cardioversion.  She would rather continue with medication therapy only with no invasive procedures going forward.  Her TEE cardioversion has been canceled. ? ?Her blood pressure has been soft and therefore cannot increase rate controlling medications further.  Her heart rate is fairly well controlled at baseline. ? ?[facility] will sign off.   ?Medication Recommendations:  Apixaban 2.5 mg twice daily, Lopressor 25 mg every 8 hours ?Other recommendations (labs, testing, etc): None ?Follow up as an outpatient: A-fib clinic in 1 week ?

## 2021-11-06 NOTE — Progress Notes (Signed)
Pt very tired this AM. Pt unable to wake up enough to take medication due to being difficult to wake up. It was reported from night shift that pt was awake most of the night. Pt was sternal rubbed and woke up but went back to sleep. Pt tried to resist blood pressure check and then went back to sleep again. Pt will node and shake head when spoken to but will not fully open her eyes and then goes back to sleep as well. Blood sugar checked at bedside and resulted at 180. Family medicine doctor at bedside.  ?

## 2021-11-06 NOTE — Progress Notes (Signed)
Interim Progress Note  ? ?Called daughter to discuss proceeding with the TEE/DCCV vs continuing with oral medications to attempt with rate control as patient is asymptomatic. Her daughter agrees that her mother is asymptomatic and that she is not very active at baseline. She is not having any shortness of breath, palpitations, etc. We discussed she may continue to have this irregular rhythm or may flip in/out, it is hard to know. We discussed that the cardioversion is a generally safe procedure but it would be worth considering how aggressive we wanted to be with her mother given her age, given that she is asymptomatic, and sedentary at baseline. Her daughter was amenable to not proceeding with the TEE/Cardioversion today. Discussed that if we could get patient more rate controlled we could discharge her back to her memory care facility possibly today at the earliest, vs tomorrow. Daughter was in agreement with plan.  ?

## 2021-11-06 NOTE — TOC Initial Note (Addendum)
Transition of Care (TOC) - Initial/Assessment Note  ? ? ?Patient Details  ?Name: Emily Waller ?MRN: [id number] ?Date of Birth: 1928-01-10 ? ?Transition of Care (TOC) CM/SW Contact:    ?Milas Gain, LCSWA ?Phone Number: ?11/06/2021, 11:06 AM ? ?Clinical Narrative:                 ? ? ?Due to patients orientation CSW spoke with patients daughter Emily Waller regarding dc plan. Emily Waller reports patient comes from [facility] memory care. Plan is for her to return at dc. CSW called [facility] and spoke with Blanch Media who confirmed patient comes from memory care at [facility]. When patient is medically ready for dc will need dc summary and FL2 faxed over to [phone].CSW will continue to follow and assist with patients dc planning needs. ? ?Expected Discharge Plan: Assisted Living ([facility]) memory care ?Barriers to Discharge: Continued Medical Work up ? ? ?Patient Goals and CMS Choice ?  ?CMS Medicare.gov Compare Post Acute Care list provided to:: Patient Represenative (must comment) (Patients daughter Emily Waller) ?Choice offered to / list presented to : Adult Children (Patients daughter Emily Waller) ? ?Expected Discharge Plan and Services ?Expected Discharge Plan: Assisted Living ([facility]) memory care  ?In-house Referral: Clinical Social Work ?  ?  ?Living arrangements for the past 2 months: [place] ([facility]) memory care  ?Expected Discharge Date: 11/04/21               ?  ?  ?  ?  ?  ?  ?  ?  ?  ?  ? ?Prior Living Arrangements/Services ?Living arrangements for the past 2 months: [place] ([facility]) ?Lives with:: Facility Resident ?Patient language and need for interpreter reviewed:: Yes ?Do you feel safe going back to the place where you live?: Yes      ?Need for Family Participation in Patient Care: Yes (Comment) ?Care giver support system in place?: Yes (comment) ?  ?Criminal Activity/Legal Involvement Pertinent to Current  Situation/Hospitalization: No - Comment as needed ? ?Activities of Daily Living ?  ?  ? ?Permission Sought/Granted ?Permission sought to share information with : Case Manager, Family Supports, Customer service manager ?Permission granted to share information with : No ? Share Information with NAME: Due to patients orientation CSW spoke with pateints daughter Emily Waller ? Permission granted to share info w AGENCY: Due to patients orientation CSW spoke with pateints daughter Elizabeth/ [facility] ? Permission granted to share info w Relationship: Due to patients orientation CSW spoke with pateints daughter Emily Waller ? Permission granted to share info w Contact Information: Due to patients orientation CSW spoke with pateints daughter Emily Waller [phone] ? ?Emotional Assessment ?  ?  ?  ?Orientation: : Oriented to Self ?Alcohol / Substance Use: Not Applicable ?Psych Involvement: No (comment) ? ?Admission diagnosis:  Atrial flutter with rapid ventricular response ([place]) [I48.92] ?Atrial fibrillation with RVR ([place]) [I48.91] ?Atrial fibrillation, unspecified type ([place]) [I48.91] ?Patient Active Problem List  ? Diagnosis Date Noted  ? Atrial fibrillation with RVR ([place]) 11/04/2021  ? Atrial flutter with rapid ventricular response ([place])   ? Symptomatic bradycardia 07/07/2018  ? Renal insufficiency 07/07/2018  ? Diet-controlled diabetes mellitus ([place]) 07/07/2018  ? Itching 09/12/2015  ? Diabetes ([place]) 09/12/2015  ? HTN (hypertension) 09/12/2015  ? Depression 09/12/2015  ? ?PCP:  Patient, No Pcp Per (Inactive) ?Pharmacy:  No Pharmacies Listed ? ? ? ?Social Determinants of Health (SDOH) Interventions ?  ? ?Readmission Risk  Interventions ?   ? View : No data to display.  ?  ?  ?  ? ? ? ?

## 2021-11-06 NOTE — Progress Notes (Signed)
A fib clinic follow up arranged on 11/13/21, see AVS  ?

## 2021-11-06 NOTE — Progress Notes (Signed)
Spoke to daughter and she states that pt has episodes some days that she is very lethargic and sleepy. Reports pt being sleepy is normal on some days.  ?

## 2021-11-06 NOTE — Plan of Care (Signed)

## 2021-11-11 ENCOUNTER — Encounter (HOSPITAL_COMMUNITY): Payer: Self-pay

## 2021-11-11 ENCOUNTER — Emergency Department (HOSPITAL_COMMUNITY): Payer: Medicare Other

## 2021-11-11 ENCOUNTER — Inpatient Hospital Stay (HOSPITAL_COMMUNITY)
Admission: EM | Admit: 2021-11-11 | Discharge: 2021-11-14 | DRG: 871 | Disposition: A | Discharge status: Skilled Nursing Facility | Payer: Medicare Other | Source: Skilled Nursing Facility | Attending: Family Medicine | Admitting: Family Medicine

## 2021-11-11 ENCOUNTER — Other Ambulatory Visit: Payer: Self-pay

## 2021-11-11 DIAGNOSIS — R001 Bradycardia, unspecified: Secondary | ICD-10-CM | POA: Diagnosis not present

## 2021-11-11 DIAGNOSIS — F05 Delirium due to known physiological condition: Secondary | ICD-10-CM | POA: Diagnosis present

## 2021-11-11 DIAGNOSIS — R0902 Hypoxemia: Secondary | ICD-10-CM | POA: Diagnosis not present

## 2021-11-11 DIAGNOSIS — Z6821 Body mass index (BMI) 21.0-21.9, adult: Secondary | ICD-10-CM

## 2021-11-11 DIAGNOSIS — Z66 Do not resuscitate: Secondary | ICD-10-CM | POA: Diagnosis present

## 2021-11-11 DIAGNOSIS — E785 Hyperlipidemia, unspecified: Secondary | ICD-10-CM | POA: Diagnosis present

## 2021-11-11 DIAGNOSIS — H9190 Unspecified hearing loss, unspecified ear: Secondary | ICD-10-CM

## 2021-11-11 DIAGNOSIS — Z7901 Long term (current) use of anticoagulants: Secondary | ICD-10-CM

## 2021-11-11 DIAGNOSIS — I129 Hypertensive chronic kidney disease with stage 1 through stage 4 chronic kidney disease, or unspecified chronic kidney disease: Secondary | ICD-10-CM | POA: Diagnosis present

## 2021-11-11 DIAGNOSIS — F0284 Dementia in other diseases classified elsewhere, unspecified severity, with anxiety: Secondary | ICD-10-CM | POA: Diagnosis present

## 2021-11-11 DIAGNOSIS — H01003 Unspecified blepharitis right eye, unspecified eyelid: Secondary | ICD-10-CM | POA: Diagnosis present

## 2021-11-11 DIAGNOSIS — R64 Cachexia: Secondary | ICD-10-CM | POA: Diagnosis present

## 2021-11-11 DIAGNOSIS — Z83438 Family history of other disorder of lipoprotein metabolism and other lipidemia: Secondary | ICD-10-CM

## 2021-11-11 DIAGNOSIS — I4821 Permanent atrial fibrillation: Secondary | ICD-10-CM | POA: Diagnosis present

## 2021-11-11 DIAGNOSIS — E1122 Type 2 diabetes mellitus with diabetic chronic kidney disease: Secondary | ICD-10-CM | POA: Diagnosis present

## 2021-11-11 DIAGNOSIS — Z20822 Contact with and (suspected) exposure to covid-19: Secondary | ICD-10-CM | POA: Diagnosis present

## 2021-11-11 DIAGNOSIS — Z8673 Personal history of transient ischemic attack (TIA), and cerebral infarction without residual deficits: Secondary | ICD-10-CM

## 2021-11-11 DIAGNOSIS — Z8719 Personal history of other diseases of the digestive system: Secondary | ICD-10-CM

## 2021-11-11 DIAGNOSIS — E559 Vitamin D deficiency, unspecified: Secondary | ICD-10-CM | POA: Diagnosis present

## 2021-11-11 DIAGNOSIS — G309 Alzheimer's disease, unspecified: Secondary | ICD-10-CM | POA: Diagnosis present

## 2021-11-11 DIAGNOSIS — Z8249 Family history of ischemic heart disease and other diseases of the circulatory system: Secondary | ICD-10-CM

## 2021-11-11 DIAGNOSIS — I10 Essential (primary) hypertension: Secondary | ICD-10-CM | POA: Diagnosis present

## 2021-11-11 DIAGNOSIS — Z801 Family history of malignant neoplasm of trachea, bronchus and lung: Secondary | ICD-10-CM

## 2021-11-11 DIAGNOSIS — E119 Type 2 diabetes mellitus without complications: Secondary | ICD-10-CM

## 2021-11-11 DIAGNOSIS — Z888 Allergy status to other drugs, medicaments and biological substances status: Secondary | ICD-10-CM

## 2021-11-11 DIAGNOSIS — J189 Pneumonia, unspecified organism: Secondary | ICD-10-CM | POA: Diagnosis present

## 2021-11-11 DIAGNOSIS — I4892 Unspecified atrial flutter: Secondary | ICD-10-CM | POA: Diagnosis present

## 2021-11-11 DIAGNOSIS — Z79899 Other long term (current) drug therapy: Secondary | ICD-10-CM

## 2021-11-11 DIAGNOSIS — M109 Gout, unspecified: Secondary | ICD-10-CM | POA: Diagnosis present

## 2021-11-11 DIAGNOSIS — A419 Sepsis, unspecified organism: Principal | ICD-10-CM | POA: Diagnosis present

## 2021-11-11 DIAGNOSIS — F0283 Dementia in other diseases classified elsewhere, unspecified severity, with mood disturbance: Secondary | ICD-10-CM | POA: Diagnosis present

## 2021-11-11 DIAGNOSIS — I672 Cerebral atherosclerosis: Secondary | ICD-10-CM | POA: Diagnosis present

## 2021-11-11 DIAGNOSIS — N1832 Chronic kidney disease, stage 3b: Secondary | ICD-10-CM | POA: Diagnosis present

## 2021-11-11 DIAGNOSIS — Z87891 Personal history of nicotine dependence: Secondary | ICD-10-CM

## 2021-11-11 DIAGNOSIS — R54 Age-related physical debility: Secondary | ICD-10-CM | POA: Diagnosis present

## 2021-11-11 DIAGNOSIS — G9349 Other encephalopathy: Secondary | ICD-10-CM | POA: Diagnosis present

## 2021-11-11 DIAGNOSIS — J9601 Acute respiratory failure with hypoxia: Secondary | ICD-10-CM | POA: Diagnosis present

## 2021-11-11 HISTORY — DX: Unspecified atrial flutter: I48.92

## 2021-11-11 LAB — BASIC METABOLIC PANEL
Anion gap: 8 (ref 5–15)
BUN: 29 mg/dL — ABNORMAL HIGH (ref 8–23)
CO2: 22 mmol/L (ref 22–32)
Calcium: 8.5 mg/dL — ABNORMAL LOW (ref 8.9–10.3)
Chloride: 105 mmol/L (ref 98–111)
Creatinine, Ser: 1.25 mg/dL — ABNORMAL HIGH (ref 0.44–1.00)
GFR, Estimated: 41 mL/min — ABNORMAL LOW (ref >60)
Glucose, Bld: 195 mg/dL — ABNORMAL HIGH (ref 70–99)
Potassium: 5.1 mmol/L (ref 3.5–5.1)
Sodium: 135 mmol/L (ref 135–145)

## 2021-11-11 LAB — TROPONIN I (HIGH SENSITIVITY)
Troponin I (High Sensitivity): 45 ng/L — ABNORMAL HIGH (ref <18)
Troponin I (High Sensitivity): 69 ng/L — ABNORMAL HIGH (ref <18)
Troponin I (High Sensitivity): 61 ng/L — ABNORMAL HIGH (ref <18)

## 2021-11-11 LAB — CBC
HCT: 40.6 % (ref 36.0–46.0)
Hemoglobin: 13 g/dL (ref 12.0–15.0)
MCH: 31.6 pg (ref 26.0–34.0)
MCHC: 32 g/dL (ref 30.0–36.0)
MCV: 98.8 fL (ref 80.0–100.0)
Platelets: 149 10*3/uL — ABNORMAL LOW (ref 150–400)
RBC: 4.11 MIL/uL (ref 3.87–5.11)
RDW: 13.1 % (ref 11.5–15.5)
WBC: 12.7 10*3/uL — ABNORMAL HIGH (ref 4.0–10.5)
nRBC: 0 % (ref 0.0–0.2)

## 2021-11-11 LAB — RESP PANEL BY RT-PCR (FLU A&B, COVID) ARPGX2
Influenza A by PCR: NEGATIVE
Influenza B by PCR: NEGATIVE
SARS Coronavirus 2 by RT PCR: NEGATIVE

## 2021-11-11 LAB — LACTIC ACID, PLASMA: Lactic Acid, Venous: 1.1 mmol/L (ref 0.5–1.9)

## 2021-11-11 MED ORDER — SODIUM CHLORIDE 0.9 % IV SOLN
500.0000 mg | Freq: Once | INTRAVENOUS | Status: AC
  Administered 2021-11-11: 500 mg via INTRAVENOUS
  Filled 2021-11-11: qty 5

## 2021-11-11 MED ORDER — ACETAMINOPHEN 325 MG PO TABS
650.0000 mg | ORAL_TABLET | Freq: Once | ORAL | Status: AC
  Administered 2021-11-11: 650 mg via ORAL
  Filled 2021-11-11: qty 2

## 2021-11-11 MED ORDER — SODIUM CHLORIDE 0.9 % IV SOLN
1.0000 g | Freq: Once | INTRAVENOUS | Status: AC
  Administered 2021-11-11: 1 g via INTRAVENOUS
  Filled 2021-11-11: qty 10

## 2021-11-11 MED ORDER — ACETAMINOPHEN 325 MG PO TABS
650.0000 mg | ORAL_TABLET | Freq: Four times a day (QID) | ORAL | Status: DC | PRN
  Administered 2021-11-12 – 2021-11-13 (×3): 650 mg via ORAL
  Filled 2021-11-11 (×3): qty 2

## 2021-11-11 MED ORDER — ACETAMINOPHEN 650 MG RE SUPP: 650.0000 mg | Freq: Four times a day (QID) | RECTAL | Status: DC | PRN

## 2021-11-11 NOTE — ED Notes (Signed)
Antibiotics started prior to the RN assumed care. Blood cultures was not drawn prior to antibiotics given.  ?

## 2021-11-11 NOTE — Hospital Course (Addendum)
Emily Waller is a 88 y.o.female with a history of Alzheimer's disease, hypertension, Gout, hyperlipidemia, diabetes, and anxiety/depression. who was admitted to the [facility] at [facility] for fever and hypoxia. Her hospital course is detailed below: ? ?CAP  ?Patient presented with hypoxia and fever from ALF. CXR showed mild interstitial pulmonary edema with trace left pleural effusion and adjacent left basilar opacities.  Patient was started on azithromycin and ceftriaxone.  Patient was started on 2 L nasal cannula and was weaned to room air on 4/26. Patient was given Tylenol as needed for fevers.  Patient was transitioned to oral antibiotics on 4/26. She was discharged back to her facility with plans for complete the current antibiotic course and engage hospice services as discussed below.  ? ?Acute encephalopathy likely secondary to pneumonia ?Alzheimer's disease ?Patient was initially obtunded which was suspected to be secondary to acute hypoxemic respiratory failure in conjunction with her Alzheimer's disease.  Patient's mentation began to improve throughout hospitalization.  ?Palliative care was engaged due to patient's waxing and waning mental status and the patient's daughter elected to engage hospice upon return to the patient's skilled nursing facility.  ?See the following wishes for ongoing care as documented by our palliative care team:  ?Cardiopulmonary Resuscitation: Do Not Attempt Resuscitation (DNR/No CPR)  ?Medical Interventions: Comfort Measures: Keep clean, warm, and dry. Use medication by any route, positioning, wound care, and other measures to relieve pain and suffering. Use oxygen, suction and manual treatment of airway obstruction as needed for comfort. Do not transfer to the hospital unless comfort needs cannot be met in current location.  ?Antibiotics: Determine use of limitation of antibiotics when infection occurs  ?IV Fluids: No IV fluids (provide other measures to  ensure comfort)  ?Feeding Tube: No feeding tube  ? ? ? ?Follow-up Recommendations: ?Patient will be pursuing hospice services upon her return to [facility]. She will complete her current antibiotic course as described in her family's wishes outlined above. She will complete treatment on 4/29 We will not continue for her asymptomatic atrial flutter and will instead focus on her comfort.  ?Patient given sulacetamide drops for blepharitis. Continue therapy through 4/30 ?

## 2021-11-11 NOTE — ED Provider Notes (Addendum)
[facility] EMERGENCY DEPARTMENT Provider Note   CSN: [id number] Arrival date & time: 11/11/21  1609     History  Chief Complaint  Patient presents with   Shortness of Breath    Emily Waller is a 88 y.o. female.  HPI Patient presenting from her facility where she receives nursing care, for evaluation of difficulty breathing.  Patient was hypoxic on room air on arrival 85%.  She was treated with nasal cannula oxygen with normalization of her oxygen saturation.  The patient is unable to give any history    Home Medications Prior to Admission medications   Medication Sig Start Date End Date Taking? Authorizing Provider  acetaminophen (TYLENOL) 325 MG tablet Take 650 mg by mouth 2 (two) times daily.    [provider]  allopurinol (ZYLOPRIM) 100 MG tablet Take 100 mg by mouth daily.    [provider]  apixaban (ELIQUIS) 2.5 MG TABS tablet Take 1 tablet (2.5 mg total) by mouth 2 (two) times daily. 11/06/21   Alicia Amel, MD  atorvastatin (LIPITOR) 10 MG tablet Take 10 mg by mouth at bedtime.    [provider]  metoprolol tartrate (LOPRESSOR) 25 MG tablet Take 1 tablet (25 mg total) by mouth every 8 (eight) hours. 11/06/21 12/06/21  Alicia Amel, MD  mirtazapine (REMERON) 7.5 MG tablet Take 7.5 mg by mouth at bedtime.    [provider]  nystatin ointment (MYCOSTATIN) Apply 1 application. topically 2 (two) times daily. Left Breast    [provider]  pantoprazole (PROTONIX) 40 MG tablet Take 40 mg by mouth daily.    [provider]  traMADol (ULTRAM) 50 MG tablet Take 1 tablet (50 mg total) by mouth every 12 (twelve) hours as needed. 11/06/21   Alicia Amel, MD  Turmeric 500 MG CAPS Take 500 mg by mouth daily.    [provider]  Vitamin D, Ergocalciferol, (DRISDOL) 1.25 MG (50000 UNIT) CAPS capsule Take 50,000 Units by mouth every Tuesday.    [provider]      Allergies     Colcrys [colchicine] and Lovastatin    Review of Systems   Review of Systems  Physical Exam Updated Vital Signs BP (!) 174/55 (BP Location: Right Arm)   Pulse 80   Temp (!) 101.3 F (38.5 C) (Oral)   Resp 16   Ht 5\' 8"  (1.727 m)   Wt 64.3 kg   SpO2 (!) 85%   BMI 21.55 kg/m  Physical Exam Vitals and nursing note reviewed.  Constitutional:      General: She is not in acute distress.    Appearance: She is well-developed. She is ill-appearing. She is not toxic-appearing or diaphoretic.  HENT:     Head: Normocephalic and atraumatic.     Right Ear: External ear normal.     Left Ear: External ear normal.     Nose: No congestion.     Mouth/Throat:     Mouth: Mucous membranes are moist.     Pharynx: No oropharyngeal exudate or posterior oropharyngeal erythema.  Eyes:     Conjunctiva/sclera: Conjunctivae normal.     Pupils: Pupils are equal, round, and reactive to light.  Neck:     Trachea: Phonation normal.  Cardiovascular:     Rate and Rhythm: Normal rate and regular rhythm.     Heart sounds: Normal heart sounds.  Pulmonary:     Effort: Pulmonary effort is normal. No respiratory distress.  Breath sounds: Normal breath sounds. No stridor. No wheezing or rhonchi.  Chest:     Chest wall: No tenderness.  Abdominal:     Palpations: Abdomen is soft.     Tenderness: There is no abdominal tenderness.  Musculoskeletal:        General: Normal range of motion.     Cervical back: Normal range of motion and neck supple.  Skin:    General: Skin is warm and dry.  Neurological:     Mental Status: She is alert.     Cranial Nerves: No cranial nerve deficit.     Sensory: No sensory deficit.     Motor: No abnormal muscle tone.     Coordination: Coordination normal.  Psychiatric:        Mood and Affect: Mood normal.        Behavior: Behavior normal.    ED Results / Procedures / Treatments   Labs (all labs ordered are listed, but only abnormal results are displayed) Labs  Reviewed  BASIC METABOLIC PANEL - Abnormal; Notable for the following components:      Result Value   Glucose, Bld 195 (*)    BUN 29 (*)    Creatinine, Ser 1.25 (*)    Calcium 8.5 (*)    GFR, Estimated 41 (*)    All other components within normal limits  CBC - Abnormal; Notable for the following components:   WBC 12.7 (*)    Platelets 149 (*)    All other components within normal limits  TROPONIN I (HIGH SENSITIVITY) - Abnormal; Notable for the following components:   Troponin I (High Sensitivity) 45 (*)    All other components within normal limits  RESP PANEL BY RT-PCR (FLU A&B, COVID) ARPGX2  TROPONIN I (HIGH SENSITIVITY)    EKG EKG Interpretation  Date/Time:  Monday November 11 2021 16:24:45 EDT Ventricular Rate:  78 PR Interval:  163 QRS Duration: 127 QT Interval:  390 QTC Calculation: 445 R Axis:   76 Text Interpretation: Sinus rhythm Atrial premature complex Right bundle branch block Artifact in lead(s) I II III aVR aVL V1 V2 V4 V5 Since last tracing now in Sinus rhythm Otherwise no significant change Confirmed by Mancel Bale [phone]) on 11/11/2021 4:34:43 PM  Radiology DG Chest Port 1 View  Result Date: 11/11/2021 CLINICAL DATA:  Fever EXAM: PORTABLE CHEST 1 VIEW COMPARISON:  Radiograph 11/04/2021 FINDINGS: Unchanged cardiomediastinal silhouette. There are diffuse mild interstitial opacities and left basilar airspace opacities. Probable trace left pleural effusion. No visible pneumothorax. Chronic right proximal humerus deformity. No acute osseous abnormality. IMPRESSION: Mild interstitial pulmonary edema with trace left pleural effusion and adjacent left basilar opacities which could be atelectasis or developing infection. Electronically Signed   By: Caprice Renshaw M.D.   On: 11/11/2021 16:48    Procedures Procedures    Medications Ordered in ED Medications  cefTRIAXone (ROCEPHIN) 1 g in sodium chloride 0.9 % 100 mL IVPB (has no administration in time range)   azithromycin (ZITHROMAX) 500 mg in sodium chloride 0.9 % 250 mL IVPB (has no administration in time range)  acetaminophen (TYLENOL) tablet 650 mg (650 mg Oral Given 11/11/21 1645)    ED Course/ Medical Decision Making/ A&P                           Medical Decision Making Patient presenting for evaluation of difficulty breathing, found to be hypoxic and febrile.  Screening for causes of infectious processes  leading to pulmonary disorders has been ordered.  Amount and/or Complexity of Data Reviewed External Data Reviewed: labs, radiology, ECG and notes.    Details: Patient admitted to the hospital on 11/04/2021, at that time was in rapid atrial flutter.  She had a cardiac echo which showed normal EF without valvular abnormalities.  She was treated with Eliquis, and metoprolol, due to rapid atrial flutter.  She was discharged 5 days ago Labs: ordered.    Details: CBC, metabolic panel, troponin normal except glucose high, calcium low, BUN high, creatinine high, GFR low, white count high, troponin slightly higher than baseline, delta troponin pending at the time of disposition. Radiology: ordered and independent interpretation performed.    Details: Chest x-ray-mild interstitial pulmonary edema, with left basilar opacities consistent with infection. ECG/medicine tests: ordered and independent interpretation performed.    Details: Cardiac monitor-normal sinus rhythm. Discussion of management or test interpretation with external provider(s): Consult admitting teaching service resident for family medicine.  Will evaluate patient for admission.  Risk OTC drugs. Prescription drug management. Decision regarding hospitalization. Risk Details: Patient presenting for evaluation of difficulty breathing, found to be hypoxic and had likely new left-sided pneumonia.  She is in sinus rhythm today, improved from recent atrial flutter rhythm.  Patient has chronic dementia and cannot contribute to history.   Doubt severe sepsis or significant acute metabolic abnormality.  She does appear to be mildly dehydrated.  Oxygenation improved with nasal cannula oxygen.  She does not require supportive airway, or cardiovascular status.  Doubt severe sepsis, or significant renal failure requiring dialysis or further acute interventions.  Patient treated with IV fluids, which will be maintained.  Doubt ACS.  Initial troponin is somewhat higher than baseline.  She will need to be admitted for stabilization and consideration of possible treatment at home with oxygen by nasal cannula and oral antibiotics when stable  Critical Care Total time providing critical care: 40 minutes          Final Clinical Impression(s) / ED Diagnoses Final diagnoses:  Community acquired pneumonia of left lower lobe of lung  Hypoxia    Rx / DC Orders ED Discharge Orders     None         Mancel Bale, MD 11/11/21 Ayesha Mohair    Mancel Bale, MD 11/11/21 2232

## 2021-11-11 NOTE — ED Notes (Addendum)
Troponin 69. Hospitalist Emily Ravens MD made aware  ?

## 2021-11-11 NOTE — H&P (Addendum)
Family Medicine Teaching Service ?Hospital Admission History and Physical ?Service Pager: [phone] ? ?Patient name: Emily Waller Medical record number: [id number] ?Date of birth: Jul 14, 1928 Age: 88 y.o. Gender: female ? ?Primary Care Provider: Patient, No Pcp Per (Inactive) ?Consultants: none ?Code Status: DNR, confirmed with HCPOA (Emily Waller) ?Preferred Emergency Contact: Emily ?Contact Information   ? ? Name Relation Home Work Mobile  ? Emily Waller Emily [phone]   ? Emily Waller Son [phone]    ? ?  ? ? ?Chief Complaint: dyspnea ? ?Assessment and Plan: ?Emily Waller is a 88 y.o. female presenting with acute respiratory failure secondary to pneumonia . PMH is significant for Alzheimer's disease, aflutter, hypertension, Gout, hyperlipidemia, diabetes, and anxiety/depression ? ?Acute Hypoxic Respiratory Failure ?CAP vs HAP ?Sepsis ?Patient presented from living facility due to acute respiratory distress.  Per living facility, patient desatted to 88% and was having increased work of breathing.  Patient also had 2-day history of dry cough. On presentation, patient was febrile to 101.3, hypertensive 178/58, O2 sat 82 on room air, heart rate 78, eupneic. Patient was promptly placed on 2 L nasal cannula which was sufficient for patient's O2 saturation to increase to 99%. Patient is COVID-negative, flu negative.  WBC 12.7.  Hemoglobin stable 13.3>13.0. Chest x-ray showed mild interstitial pulmonary edema with trace left pleural effusion and adjacent left basilar opacities concerning for atelectasis or developing infection. Patient was given Tylenol p.o. for fever and started on azithromycin and ceftriaxone for likely community-acquired pneumonia.  Patient meeting sepsis criteria.  ?-Admit to FPTS, attending Dr. Gwendlyn Deutscher, to telemed ?-Vitals per floor ?-Continue ceftriaxone and azithromycin ?-Blood culture x2 ordered (after antibiotics were started) ?-VBG, Lactic Acid,  Legionella and strep pneumonia ordered ?-PO/PR tylenol 650 mg q6 hour for fever/mild pain (received scheduled tylenol BID at living facility) ?-AM CBC ?-PT/OT eval and treat ? ?Acute encephalopathy likely secondary to pneumonia ?Alzheimer's disease ?Patient acutely obtunded on assessment in comparison to when she had departed from living facility via EMS.  This is likely secondary to hypoxia and pneumonia in the context of her Alzheimer's disease.  However, given her acute mental status change and being on Eliquis, will obtain head CT, VBG, and CBG to assess for alternative etiologies of encephalopathy.  BMP unremarkable with actually improving creatinine since discharge 11/06/2021 (1.42>1.25) ?-fall precautions  ?-delirium precautions ?-NPO until more alert ?-AM CMP ?-Stat CT head w/o contrast ?-UA ?-Plan for discharge back to SNF when able ? ?Hx of aflutter ?Recently started on eliquis 2.5 bid for aflutter. Bradycardic and sinus rhythm at this time. Home med: metoprolol 25 q8 hour ?-Continue cardiac monitoring ?-Holding eliquis as patient obtunded (also checking head ct for ICH) ? ?Hx of UTI ?Elevated Cr ?Had been on macrobid for UTI but discontinued after 4/5 day course due to renal insufficiency. Cr 1.42 (4/19)>1.25. Unclear if has CKD ?-AM CMP ?-May benefit from mIVF if continues to be obtunded ? ?Elevated troponin ?EDP ordered troponin 45>69. Likely demand. Will follow until peaks ?-Continue to trend trops till peak ? ?HTN ?Normotensive. Was discharged on metoprolol 25 mg tid. ?-Holding home meds, will resume when less obtunded ?  ?Hx of Diabetes ?Has not had A1c for some time. ?-A1c ordered ? ?Chronic stable conditions ?Holding all home meds in setting of encephalopathy ?Anxiety/Depression- home med: Mirtazapine 7.'5mg'$  QHS  ?HLD- home med: lipitor '10mg'$  QHS ?Gout- home med: allopurinol  ?Chronic pain- home meds: tramadol '50mg'$  q12h PRN (has not had since 4/8 per living facility RN), scheduled tylenol  bid ? ?FEN/GI: NPO due to encephalopathy ?Prophylaxis: eliquis when less obtunded ? ?Disposition: telemed ? ?History of Present Illness:  Emily Waller is a 88 y.o. female presenting from [facility] living facility with acute respiratory distress secondary to pneumonia. ? ?Per nurse at living facility, patient was having increased work of breathing as well as low O2 saturation at 88% on room air while sitting up in wheelchair.  She was alert at that time.  Patient was noted to have 2 days of dry cough as well.  Spoke with Emily who regularly checks in on patient and Emily reports that patient appeared more confused last night and had gone to bed early.  Emily checked in on patient again this morning and reports that she had become more alert after eating some food. ? ?On assessment, patient is obtunded and difficult to arouse. Attempted to wean but was saturating at 92% on RA so placed back on 2L [place]. Vital signs stable on 2 L nasal cannula. No notable cough. ? ?LEVEL 5 CAVEAT due to mental status ? ?Review Of Systems: Patient is acutely encephalopathic in the setting of dementia ? ?Review of Systems  ?Unable to perform ROS: Dementia   ? ?Patient Active Problem List  ? Diagnosis Date Noted  ? CAP (community acquired pneumonia) 11/11/2021  ? Atrial fibrillation with RVR ([place]) 11/04/2021  ? Atrial flutter with rapid ventricular response ([place])   ? Symptomatic bradycardia 07/07/2018  ? Renal insufficiency 07/07/2018  ? Diet-controlled diabetes mellitus ([place]) 07/07/2018  ? Itching 09/12/2015  ? Diabetes ([place]) 09/12/2015  ? HTN (hypertension) 09/12/2015  ? Depression 09/12/2015  ? ? ?Past Medical History: ?Past Medical History:  ?Diagnosis Date  ? Anxiety   ? Depression   ? Diabetes mellitus without complication ([place])   ? Hyperlipidemia   ? Hypertension   ? Renal disorder   ? Kidney disease  ? Vitamin D deficiency   ? ? ?Past Surgical History: ?History reviewed. No pertinent surgical history. ? ?Social  History: ?Social History  ? ?Tobacco Use  ? Smoking status: Former  ?  Types: Cigarettes  ?  Quit date: 10/02/1969  ?  Years since quitting: 52.1  ? Smokeless tobacco: Never  ?Vaping Use  ? Vaping Use: Never used  ?Substance Use Topics  ? Alcohol use: No  ?  Alcohol/week: 0.0 standard drinks  ? Drug use: No  ? ?Additional social history:   ?Please also refer to relevant sections of EMR. ? ?Family History: ?Family History  ?Problem Relation Age of Onset  ? Hyperlipidemia Mother   ? Hypertension Mother   ? Cancer Father   ? Heart disease Father   ? CAD Maternal Grandmother   ? CAD Maternal Grandfather   ? Lung cancer Sister   ? ? ?Allergies and Medications: ?Allergies  ?Allergen Reactions  ? Colcrys [Colchicine] Nausea Only  ?  Stomach upset ?  ? Lovastatin Other (See Comments)  ?  myalgia  ? ?No current facility-administered medications on file prior to encounter.  ? ?Current Outpatient Medications on File Prior to Encounter  ?Medication Sig Dispense Refill  ? acetaminophen (TYLENOL) 325 MG tablet Take 650 mg by mouth 2 (two) times daily.    ? allopurinol (ZYLOPRIM) 100 MG tablet Take 100 mg by mouth daily.    ? apixaban (ELIQUIS) 2.5 MG TABS tablet Take 1 tablet (2.5 mg total) by mouth 2 (two) times daily. 60 tablet 0  ? atorvastatin (LIPITOR) 10 MG tablet Take 10 mg by mouth  at bedtime.    ? metoprolol tartrate (LOPRESSOR) 25 MG tablet Take 1 tablet (25 mg total) by mouth every 8 (eight) hours. 90 tablet 0  ? mirtazapine (REMERON) 7.5 MG tablet Take 7.5 mg by mouth at bedtime.    ? nystatin ointment (MYCOSTATIN) Apply 1 application. topically 2 (two) times daily. Left Breast    ? pantoprazole (PROTONIX) 40 MG tablet Take 40 mg by mouth daily.    ? traMADol (ULTRAM) 50 MG tablet Take 1 tablet (50 mg total) by mouth every 12 (twelve) hours as needed. 7 tablet 0  ? Turmeric 500 MG CAPS Take 500 mg by mouth daily.    ? Vitamin D, Ergocalciferol, (DRISDOL) 1.25 MG (50000 UNIT) CAPS capsule Take 50,000 Units by mouth  every Tuesday.    ? ? ?Objective: ?BP (!) 174/55 (BP Location: Right Arm)   Pulse 80   Temp (!) 101.3 ?F (38.5 ?C) (Oral)   Resp 16   Ht '5\' 8"'$  (1.727 m)   Wt 141 lb 12.1 oz (64.3 kg)   SpO2 (!) 85%   BMI 21.55 kg/m?  ?Exam: ?General

## 2021-11-11 NOTE — ED Triage Notes (Signed)
Pt arrived via [facility] from [facility]. Pt has dementia at baseline. Per EMS, staff said it looks like she's having trouble breathing, but EMS states pt does not appear to be distress and her lungs are clear. Pt is Eupneic. ?

## 2021-11-12 ENCOUNTER — Observation Stay (HOSPITAL_COMMUNITY): Payer: Medicare Other

## 2021-11-12 DIAGNOSIS — M109 Gout, unspecified: Secondary | ICD-10-CM | POA: Diagnosis present

## 2021-11-12 DIAGNOSIS — I129 Hypertensive chronic kidney disease with stage 1 through stage 4 chronic kidney disease, or unspecified chronic kidney disease: Secondary | ICD-10-CM | POA: Diagnosis present

## 2021-11-12 DIAGNOSIS — I4892 Unspecified atrial flutter: Secondary | ICD-10-CM | POA: Diagnosis present

## 2021-11-12 DIAGNOSIS — Z8673 Personal history of transient ischemic attack (TIA), and cerebral infarction without residual deficits: Secondary | ICD-10-CM | POA: Diagnosis not present

## 2021-11-12 DIAGNOSIS — Z515 Encounter for palliative care: Secondary | ICD-10-CM | POA: Diagnosis not present

## 2021-11-12 DIAGNOSIS — I672 Cerebral atherosclerosis: Secondary | ICD-10-CM | POA: Diagnosis present

## 2021-11-12 DIAGNOSIS — E1122 Type 2 diabetes mellitus with diabetic chronic kidney disease: Secondary | ICD-10-CM | POA: Diagnosis present

## 2021-11-12 DIAGNOSIS — J189 Pneumonia, unspecified organism: Secondary | ICD-10-CM | POA: Diagnosis present

## 2021-11-12 DIAGNOSIS — R778 Other specified abnormalities of plasma proteins: Secondary | ICD-10-CM | POA: Diagnosis not present

## 2021-11-12 DIAGNOSIS — F05 Delirium due to known physiological condition: Secondary | ICD-10-CM | POA: Diagnosis present

## 2021-11-12 DIAGNOSIS — Z8249 Family history of ischemic heart disease and other diseases of the circulatory system: Secondary | ICD-10-CM | POA: Diagnosis not present

## 2021-11-12 DIAGNOSIS — Z87891 Personal history of nicotine dependence: Secondary | ICD-10-CM | POA: Diagnosis not present

## 2021-11-12 DIAGNOSIS — Z66 Do not resuscitate: Secondary | ICD-10-CM | POA: Diagnosis present

## 2021-11-12 DIAGNOSIS — Z83438 Family history of other disorder of lipoprotein metabolism and other lipidemia: Secondary | ICD-10-CM | POA: Diagnosis not present

## 2021-11-12 DIAGNOSIS — Z20822 Contact with and (suspected) exposure to covid-19: Secondary | ICD-10-CM | POA: Diagnosis present

## 2021-11-12 DIAGNOSIS — G9349 Other encephalopathy: Secondary | ICD-10-CM | POA: Diagnosis present

## 2021-11-12 DIAGNOSIS — J9601 Acute respiratory failure with hypoxia: Secondary | ICD-10-CM | POA: Diagnosis present

## 2021-11-12 DIAGNOSIS — H01002 Unspecified blepharitis right lower eyelid: Secondary | ICD-10-CM | POA: Diagnosis not present

## 2021-11-12 DIAGNOSIS — I4821 Permanent atrial fibrillation: Secondary | ICD-10-CM | POA: Diagnosis present

## 2021-11-12 DIAGNOSIS — F0283 Dementia in other diseases classified elsewhere, unspecified severity, with mood disturbance: Secondary | ICD-10-CM | POA: Diagnosis present

## 2021-11-12 DIAGNOSIS — Z7189 Other specified counseling: Secondary | ICD-10-CM | POA: Diagnosis not present

## 2021-11-12 DIAGNOSIS — A419 Sepsis, unspecified organism: Secondary | ICD-10-CM | POA: Diagnosis present

## 2021-11-12 DIAGNOSIS — R0902 Hypoxemia: Secondary | ICD-10-CM | POA: Diagnosis present

## 2021-11-12 DIAGNOSIS — Z8719 Personal history of other diseases of the digestive system: Secondary | ICD-10-CM | POA: Diagnosis not present

## 2021-11-12 DIAGNOSIS — G309 Alzheimer's disease, unspecified: Secondary | ICD-10-CM | POA: Diagnosis present

## 2021-11-12 DIAGNOSIS — N1832 Chronic kidney disease, stage 3b: Secondary | ICD-10-CM | POA: Diagnosis present

## 2021-11-12 DIAGNOSIS — F0284 Dementia in other diseases classified elsewhere, unspecified severity, with anxiety: Secondary | ICD-10-CM | POA: Diagnosis present

## 2021-11-12 DIAGNOSIS — R64 Cachexia: Secondary | ICD-10-CM | POA: Diagnosis present

## 2021-11-12 DIAGNOSIS — Z801 Family history of malignant neoplasm of trachea, bronchus and lung: Secondary | ICD-10-CM | POA: Diagnosis not present

## 2021-11-12 LAB — URINALYSIS, ROUTINE W REFLEX MICROSCOPIC
Bilirubin Urine: NEGATIVE
Glucose, UA: NEGATIVE mg/dL
Hgb urine dipstick: NEGATIVE
Ketones, ur: NEGATIVE mg/dL
Nitrite: NEGATIVE
Protein, ur: NEGATIVE mg/dL
Specific Gravity, Urine: 1.015 (ref 1.005–1.030)
pH: 5 (ref 5.0–8.0)

## 2021-11-12 LAB — BLOOD GAS, VENOUS
Acid-base deficit: 2.2 mmol/L — ABNORMAL HIGH (ref 0.0–2.0)
Bicarbonate: 24.2 mmol/L (ref 20.0–28.0)
Drawn by: 1583
O2 Saturation: 81.1 %
Patient temperature: 37
pCO2, Ven: 47 mmHg (ref 44–60)
pH, Ven: 7.32 (ref 7.25–7.43)
pO2, Ven: 49 mmHg — ABNORMAL HIGH (ref 32–45)

## 2021-11-12 LAB — COMPREHENSIVE METABOLIC PANEL
ALT: 12 U/L (ref 0–44)
AST: 14 U/L — ABNORMAL LOW (ref 15–41)
Albumin: 3.2 g/dL — ABNORMAL LOW (ref 3.5–5.0)
Alkaline Phosphatase: 79 U/L (ref 38–126)
Anion gap: 8 (ref 5–15)
BUN: 29 mg/dL — ABNORMAL HIGH (ref 8–23)
CO2: 24 mmol/L (ref 22–32)
Calcium: 8.3 mg/dL — ABNORMAL LOW (ref 8.9–10.3)
Chloride: 104 mmol/L (ref 98–111)
Creatinine, Ser: 1.35 mg/dL — ABNORMAL HIGH (ref 0.44–1.00)
GFR, Estimated: 37 mL/min — ABNORMAL LOW (ref >60)
Glucose, Bld: 171 mg/dL — ABNORMAL HIGH (ref 70–99)
Potassium: 4.1 mmol/L (ref 3.5–5.1)
Sodium: 136 mmol/L (ref 135–145)
Total Bilirubin: 0.6 mg/dL (ref 0.3–1.2)
Total Protein: 6.5 g/dL (ref 6.5–8.1)

## 2021-11-12 LAB — CBC
HCT: 37.9 % (ref 36.0–46.0)
Hemoglobin: 12.5 g/dL (ref 12.0–15.0)
MCH: 32.2 pg (ref 26.0–34.0)
MCHC: 33 g/dL (ref 30.0–36.0)
MCV: 97.7 fL (ref 80.0–100.0)
Platelets: 123 10*3/uL — ABNORMAL LOW (ref 150–400)
RBC: 3.88 MIL/uL (ref 3.87–5.11)
RDW: 13.2 % (ref 11.5–15.5)
WBC: 10.1 10*3/uL (ref 4.0–10.5)
nRBC: 0 % (ref 0.0–0.2)

## 2021-11-12 LAB — HEMOGLOBIN A1C
Hgb A1c MFr Bld: 7.4 % — ABNORMAL HIGH (ref 4.8–5.6)
Mean Plasma Glucose: 165.68 mg/dL

## 2021-11-12 LAB — HEPARIN LEVEL (UNFRACTIONATED): Heparin Unfractionated: >1.1 IU/mL — ABNORMAL HIGH (ref 0.30–0.70)

## 2021-11-12 LAB — PROTIME-INR
INR: 1.3 — ABNORMAL HIGH (ref 0.8–1.2)
Prothrombin Time: 15.8 seconds — ABNORMAL HIGH (ref 11.4–15.2)

## 2021-11-12 LAB — APTT: aPTT: 35 seconds (ref 24–36)

## 2021-11-12 LAB — STREP PNEUMONIAE URINARY ANTIGEN: Strep Pneumo Urinary Antigen: NEGATIVE

## 2021-11-12 MED ORDER — APIXABAN 2.5 MG PO TABS
2.5000 mg | ORAL_TABLET | Freq: Two times a day (BID) | ORAL | Status: DC
Start: 2021-11-12 — End: 2021-11-14
  Administered 2021-11-12 – 2021-11-14 (×5): 2.5 mg via ORAL
  Filled 2021-11-12 (×5): qty 1

## 2021-11-12 MED ORDER — NAPHAZOLINE-GLYCERIN 0.012-0.25 % OP SOLN
1.0000 [drp] | Freq: Once | OPHTHALMIC | Status: AC
  Administered 2021-11-12: 2 [drp] via OPHTHALMIC
  Filled 2021-11-12: qty 15

## 2021-11-12 MED ORDER — SODIUM CHLORIDE 0.9 % IV SOLN: INTRAVENOUS | Status: DC

## 2021-11-12 MED ORDER — SODIUM CHLORIDE 0.9 % IV SOLN
500.0000 mg | INTRAVENOUS | Status: DC
  Administered 2021-11-12: 500 mg via INTRAVENOUS
  Filled 2021-11-12: qty 5

## 2021-11-12 MED ORDER — HEPARIN (PORCINE) 25000 UT/250ML-% IV SOLN
800.0000 [IU]/h | INTRAVENOUS | Status: DC
  Administered 2021-11-12: 800 [IU]/h via INTRAVENOUS
  Filled 2021-11-12: qty 250

## 2021-11-12 MED ORDER — METOPROLOL TARTRATE 25 MG PO TABS
25.0000 mg | ORAL_TABLET | Freq: Three times a day (TID) | ORAL | Status: DC
Start: 2021-11-12 — End: 2021-11-13
  Administered 2021-11-12 (×2): 25 mg via ORAL
  Filled 2021-11-12 (×2): qty 1

## 2021-11-12 MED ORDER — ORAL CARE MOUTH RINSE
15.0000 mL | Freq: Two times a day (BID) | OROMUCOSAL | Status: DC
  Administered 2021-11-12 – 2021-11-14 (×3): 15 mL via OROMUCOSAL

## 2021-11-12 MED ORDER — SODIUM CHLORIDE 0.9 % IV SOLN
1.0000 g | INTRAVENOUS | Status: DC
  Administered 2021-11-12: 1 g via INTRAVENOUS
  Filled 2021-11-12: qty 10

## 2021-11-12 NOTE — Evaluation (Signed)
Clinical/Bedside Swallow Evaluation ?Patient Details  ?Name: Emily Waller ?MRN: [id number] ?Date of Birth: Nov 24, 1927 ? ?Today's Date: 11/12/2021 ?Time: SLP Start Time (ACUTE ONLY): 0915 SLP Stop Time (ACUTE ONLY): 6440 ?SLP Time Calculation (min) (ACUTE ONLY): 20 min ? ?Past Medical History:  ?Past Medical History:  ?Diagnosis Date  ? Anxiety   ? Depression   ? Diabetes mellitus without complication ([place])   ? Hyperlipidemia   ? Hypertension   ? Renal disorder   ? Kidney disease  ? Vitamin D deficiency   ? ?Past Surgical History: History reviewed. No pertinent surgical history. ?HPI:  ?88 Y/O M with PMX of DM2, HTN, Aflutters, Vit D deficiency brought in for SOB and hypoxia. Concern for sepsis due to pna. Head CT negative. No hx of dysphagia in chart.  ?  ?Assessment / Plan / Recommendation  ?Clinical Impression ? Pt seen with daughter arriving at bedside who reprots pt has a healthy appetite, eats well without signs of aspiration at baseline. Pt awoke with repositioning and loud instuctions given. Pt took bites and sips without immediate signs of aspiration. Pt does have an intermittent baseline cough. Recommend pt consume a mechanical soft diet with thin liquids and pills crushed in puree today. Will f/u for tolerance given baseline cough and advanced age. ?SLP Visit Diagnosis: Dysphagia, unspecified (R13.10) ?   ?Aspiration Risk ? Mild aspiration risk  ?  ?Diet Recommendation Dysphagia 3 (Mech soft);Thin liquid  ? ?Liquid Administration via: Cup;Straw ?Medication Administration: Crushed with puree ?Supervision: Staff to assist with self feeding;Full supervision/cueing for compensatory strategies ?Compensations: Slow rate;Small sips/bites ?Postural Changes: Seated upright at 90 degrees  ?  ?Other  Recommendations Oral Care Recommendations: Oral care BID   ? ?Recommendations for follow up therapy are 88 one component of a multi-disciplinary discharge planning process, led by the attending physician.  Recommendations  may be updated based on patient status, additional functional criteria and insurance authorization. ? ?Follow up Recommendations No SLP follow up  ? ? ?  ?Assistance Recommended at Discharge Frequent or constant Supervision/Assistance (liven in memory care at [facility])  ?Functional Status Assessment    ?Frequency and Duration    ?  ?  ?   ? ?Prognosis Prognosis for Safe Diet Advancement: Good  ? ?  ? ?Swallow Study   ?General HPI: 88 Y/O M with PMX of DM2, HTN, Aflutters, Vit D deficiency brought in for SOB and hypoxia. Concern for sepsis due to pna. Head CT negative. No hx of dysphagia in chart. ?Type of Study: Bedside Swallow Evaluation ?Diet Prior to this Study: NPO ?Temperature Spikes Noted: No ?Respiratory Status: Nasal cannula ?History of Recent Intubation: No ?Behavior/Cognition: Alert;Cooperative;Pleasant mood;Requires cueing ?Oral Cavity Assessment: Within Functional Limits ?Oral Care Completed by SLP: No ?Oral Cavity - Dentition: Adequate natural dentition ?Vision: Impaired for self-feeding ?Self-Feeding Abilities: Needs assist ?Patient Positioning: Partially reclined ?Baseline Vocal Quality: Normal ?Volitional Cough: Strong ?Volitional Swallow: Able to elicit  ?  ?Oral/Motor/Sensory Function Overall Oral Motor/Sensory Function: Within functional limits   ?Ice Chips Ice chips: Within functional limits   ?Thin Liquid Thin Liquid: Within functional limits ?Presentation: Straw;Cup;Self Fed  ?  ?Nectar Thick Nectar Thick Liquid: Not tested   ?Honey Thick     ?Puree Puree: Within functional limits   ?Solid ? ? ?  Solid: Within functional limits  ? ?  ? ?Perrion Diesel, Katherene Ponto ?11/12/2021,10:41 AM ? ? ? ? ?

## 2021-11-12 NOTE — Progress Notes (Signed)
PT Cancellation Note ? ?Patient Details ?Name: Emily Waller ?MRN: [id number] ?DOB: Aug 03, 1927 ? ? ?Cancelled Treatment:    Reason Eval/Treat Not Completed: Fatigue/lethargy limiting ability to participate.  Lethargic and fatigued from another therapy session. Follow up at another time. ? ? ?Ramond Dial ?11/12/2021, 12:47 PM ? ?Mee Hives, PT PhD ?Acute Rehab Dept. Number: [facility] [phone] and [facility] [phone] ? ?

## 2021-11-12 NOTE — Progress Notes (Addendum)
Family Medicine Teaching Service ?Daily Progress Note ?Intern Pager: [phone] ? ?Patient name: Emily Waller Medical record number: [id number] ?Date of birth: 1927/11/14 Age: 88 y.o. Gender: female ? ?Primary Care Provider: Patient, No Pcp Per (Inactive) ?Consultants: None ?Code Status: DNR ? ?Pt Overview and Major Events to Date:  ?4/24- admitted ? ?Assessment and Plan: ?Emily Waller is a 88 y.o. female presenting with acute respiratory failure secondary to pneumonia. She was recently admitted to our service for new-onset atrial flutter. PMH is significant for Alzheimer's disease, aflutter, hypertension, Gout, hyperlipidemia, diabetes, and anxiety/depression ? ?Acute obtundation on arrival ?Alzheimer's Disease ?CT Head obtained overnight with no evidence of ICH. VBG pending. Mental status this morning continues to be impaired. Patient is minimally responsive to painful stimuli, will raise her arm against sternal rub and grimaces but makes no other purposeful movements during assessment. ?- STAT VBG ?- MRI Head STAT  ?- Fall and delirium precautions ?- Given no ICH, can re-start anticoagulation with IV heparin given mental status precludes Eliquis right now ?- Gentle IVF while NPO ? ?Acute Hypoxic Respiratory Failure 2/2 Pneumonia, improving ?Meeting Sepsis Criteria on admission, resolved ?Patient has been afebrile through the night. HR WNL. Remains intermittently tachypneic but maintaining sats on room air. WBC 12.7>10.1.   ?- Continue CTX and azithromycin ?- MRSA PCR nasal swab ?- Follow-up blood cx ?- Awaiting VBG, legionella, and S pneumo urine antigen ?- Tylenol PRN for fever/pain ?- PT/OT to see ? ?Hx of atrial flutter ?HR WNL, sinus rhythm.  ?- Restart metoprolol and eliquis when mental status allows ?- Heparin as above ? ?Elevated Cr ?Cr 1.25>1.35, appears close to baseline. ? ?Elevated Troponin, resolved ?Trops downtrended overnight, 45>69>61. Suspect demand ischemia in setting of PNA.  ?- No further  follow-up unless develops new/worsening symptoms ? ?HTN ?BP 140s-150s/50s-80s. ?- Can restart metoprolol as above ? ?Hx Diabetes ?A1c 7.4. Glucoses 195>171.  ? ?FEN/GI: NPO due to acute obtundation ?PPx: On heparin ?Dispo:pending clinical improvement . Barriers include improvement in mental status, further workup.  ? ?Subjective:  ?Emily Waller is acutely obtunded this morning. She does not wake to voice and is only minimally responsive to painful stimuli. She raises her left arm against me to sternal rub and made an unintelligible utterance at one point during my assessment.  ? ?Objective: ?Temp:  [99.1 ?F (37.3 ?C)-101.3 ?F (38.5 ?C)] 99.1 ?F (37.3 ?C) (04/24 1929) ?Pulse Rate:  [49-80] 64 (04/25 0530) ?Resp:  [15-30] 20 (04/25 0530) ?BP: (110-178)/(45-83) 118/59 (04/25 0530) ?SpO2:  [82 %-100 %] 95 % (04/25 0530) ?Weight:  [64.3 kg] 64.3 kg (04/24 1623) ?Physical Exam: ?General: Ill-appearing, frail elderly female ?Cardiovascular: Bradycardic, regular rhythm, 2/6 systolic murmur ?Respiratory: Shallow breathing on room air, but maintaining O2 sats on monitor, crackles on R, diminished on L ?Abdomen: Soft, non-distended ?Extremities: Without edema or deformity ? ?Laboratory: ?Recent Labs  ?Lab 11/06/21 ?4481 11/11/21 ?1640 11/12/21 ?0438  ?WBC 8.9 12.7* 10.1  ?HGB 13.3 13.0 12.5  ?HCT 40.9 40.6 37.9  ?PLT 176 149* 123*  ? ?Recent Labs  ?Lab 11/06/21 ?8563 11/11/21 ?1640 11/12/21 ?0438  ?NA 139 135 136  ?K 4.3 5.1 4.1  ?CL 107 105 104  ?CO2 '24 22 24  '$ ?BUN 37* 29* 29*  ?CREATININE 1.42* 1.25* 1.35*  ?CALCIUM 9.0 8.5* 8.3*  ?PROT  --   --  6.5  ?BILITOT  --   --  0.6  ?ALKPHOS  --   --  17  ?ALT  --   --  12  ?AST  --   --  14*  ?GLUCOSE 176* 195* 171*  ? ? ?Imaging/Diagnostic Tests: ?CT HEAD WO CONTRAST (5MM) ?CLINICAL DATA:  Altered mental status ? ?EXAM: ?CT HEAD WITHOUT CONTRAST ? ?TECHNIQUE: ?Contiguous axial images were obtained from the base of the skull ?through the vertex without intravenous  contrast. ? ?RADIATION DOSE REDUCTION: This exam was performed according to the ?departmental dose-optimization program which includes automated ?exposure control, adjustment of the mA and/or kV according to ?patient size and/or use of iterative reconstruction technique. ? ?COMPARISON:  02/17/2021 ? ?FINDINGS: ?Brain: No evidence of acute infarction, hemorrhage, hydrocephalus, ?extra-axial collection or mass lesion/mass effect. ? ?Mild cortical and central atrophy.  Stable ventricular prominence. ? ?Subcortical white matter and periventricular small vessel ischemic ?changes. 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 ? ?Vascular: Intracranial atherosclerosis. ? ?Skull: Normal. Negative for fracture or focal lesion. ? ?Sinuses/Orbits: Partial opacification of the bilateral mastoid air ?cells. Visualized paranasal sinuses are clear. ? ?Other: None. ? ?IMPRESSION: ?No evidence of acute intracranial abnormality. Mild atrophy with ?small vessel ischemic changes. 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 ? ?Electronically Signed ?  By: Julian Hy M.D. ?  On: 11/12/2021 00:16 ? ? ? ?Eppie Gibson, MD ?11/12/2021, 8:19 AM ?PGY-1, [place] [facility] ?[place] Intern pager: [phone], text pages welcome ? ?

## 2021-11-12 NOTE — ED Notes (Signed)
Speech Therapy and family member at bedside. ?

## 2021-11-12 NOTE — Evaluation (Signed)
Physical Therapy Evaluation ?Patient Details ?Name: Emily Waller ?MRN: [id number] ?DOB: 11-22-1927 ?Today's Date: 11/12/2021 ? ?History of Present Illness ? 88 yo female with onset of SOB and hypoxia was admitted 4/24 for dramatic changes including being non communicative.  Pt was found to have acute hypoxic resp failure, PNA, sepsis, SIRS, new O2 need.  Pt is from ALF setting and will possibly have to demonstrate independent movement to return.  PMHx:  DM, a-flutter, Vit D deficiency, HTN,  ?Clinical Impression ? Pt was seen for mobility on RW, and to move on bed with help to balance and sequence movement.  Pt is very HOH and lacks recall of all details of home, particularly her ability to walk and whether she used a RW.  Pt is demonstrating a willingness to work, but without PLOF will recommend SNF to give her time to recover her safety and independence for ALF requirements.  The facility may be willing to let her return at current functional level, but without the clearance, will work toward SNF admission for her.  Follow acutely for goals of PT as are written below. ?   ? ?Recommendations for follow up therapy are one component of a multi-disciplinary discharge planning process, led by the attending physician.  Recommendations may be updated based on patient status, additional functional criteria and insurance authorization. ? ?Follow Up Recommendations Skilled nursing-short term rehab (<3 hours/day) ? ?  ?Assistance Recommended at Discharge Frequent or constant Supervision/Assistance  ?Patient can return home with the following ? A lot of help with walking and/or transfers;A little help with bathing/dressing/bathroom;Direct supervision/assist for financial management;Direct supervision/assist for medications management;Assist for transportation ? ?  ?Equipment Recommendations None recommended by PT  ?Recommendations for Other Services ?    ?  ?Functional Status Assessment Patient has had a recent decline in  their functional status and demonstrates the ability to make significant improvements in function in a reasonable and predictable amount of time.  ? ?  ?Precautions / Restrictions Precautions ?Precautions: Fall ?Precaution Comments: monitor HR and O2 sats ?Restrictions ?Weight Bearing Restrictions: No  ? ?  ? ?Mobility ? Bed Mobility ?Overal bed mobility: Needs Assistance ?Bed Mobility: Supine to Sit, Sit to Supine ?  ?  ?Supine to sit: Min assist ?Sit to supine: Mod assist ?  ?General bed mobility comments: pt is tired from the effort of standing with RW and sidestepping one step ?  ? ?Transfers ?Overall transfer level: Needs assistance ?Equipment used: Rolling walker (2 wheels), 1 person hand held assist ?Transfers: Sit to/from Stand ?Sit to Stand: Mod assist ?  ?  ?  ?  ?  ?General transfer comment: mod to support the effort and balance on side of bed ?  ? ?Ambulation/Gait ?  ?  ?  ?  ?  ?  ?  ?General Gait Details: essentially not able to walk ? ?Stairs ?  ?  ?  ?  ?  ? ?Wheelchair Mobility ?  ? ?Modified Rankin (Stroke Patients Only) ?  ? ?  ? ?Balance Overall balance assessment: Needs assistance ?Sitting-balance support: Feet supported ?Sitting balance-Leahy Scale: Fair ?  ?  ?Standing balance support: Bilateral upper extremity supported, During functional activity ?Standing balance-Leahy Scale: Poor ?Standing balance comment: unsteady and tends to lose her balance backward ?  ?  ?  ?  ?  ?  ?  ?  ?  ?  ?  ?   ? ? ? ?Pertinent Vitals/Pain Pain Assessment ?Pain Assessment: Faces ?Faces Pain  Scale: No hurt  ? ? ?Home Living Family/patient expects to be discharged to:: Assisted living ?  ?  ?  ?  ?  ?  ?  ?  ?Home Equipment: Other (comment) (pt is unable to give details of PLOF) ?Additional Comments: unsure of her facility requirements for readmission  ?  ?Prior Function Prior Level of Function : Patient poor historian/Family not available ?  ?  ?  ?  ?  ?  ?Mobility Comments: unsure of her PLOF ?ADLs Comments:  in ALF with support for dressing bathing meals meds ?  ? ? ?Hand Dominance  ? Dominant Hand: Right ? ?  ?Extremity/Trunk Assessment  ? Upper Extremity Assessment ?Upper Extremity Assessment: Generalized weakness ?  ? ?Lower Extremity Assessment ?Lower Extremity Assessment: Generalized weakness ?  ? ?Cervical / Trunk Assessment ?Cervical / Trunk Assessment: Kyphotic  ?Communication  ? Communication: HOH  ?Cognition Arousal/Alertness: Awake/alert ?Behavior During Therapy: [facility] for tasks assessed/performed ?Overall Cognitive Status: No family/caregiver present to determine baseline cognitive functioning ?  ?  ?  ?  ?  ?  ?  ?  ?  ?  ?  ?  ?  ?  ?  ?  ?General Comments: unsure of how close to baseline pt is ?  ?  ? ?  ?General Comments General comments (skin integrity, edema, etc.): pt was assisted to sit up and stand, BP was stable and her O2 sats remained in 90+% range ? ?  ?Exercises    ? ?Assessment/Plan  ?  ?PT Assessment Patient needs continued PT services  ?PT Problem List Decreased strength;Decreased activity tolerance;Decreased balance;Decreased mobility;Decreased knowledge of use of DME;Decreased safety awareness;Cardiopulmonary status limiting activity ? ?   ?  ?PT Treatment Interventions DME instruction;Gait training;Functional mobility training;Therapeutic activities;Therapeutic exercise;Balance training;Neuromuscular re-education;Patient/family education   ? ?PT Goals (Current goals can be found in the Care Plan section)  ?Acute Rehab PT Goals ?Patient Stated Goal: none stated ?PT Goal Formulation: Patient unable to participate in goal setting ?Time For Goal Achievement: 11/26/21 ?Potential to Achieve Goals: Fair ? ?  ?Frequency Min 3X/week ?  ? ? ?Co-evaluation   ?  ?  ?  ?  ? ? ?  ?AM-PAC PT "6 Clicks" Mobility  ?Outcome Measure Help needed turning from your back to your side while in a flat bed without using bedrails?: A Lot ?Help needed moving from lying on your back to sitting on the side of a flat bed  without using bedrails?: A Lot ?Help needed moving to and from a bed to a chair (including a wheelchair)?: A Lot ?Help needed standing up from a chair using your arms (e.g., wheelchair or bedside chair)?: A Lot ?Help needed to walk in hospital room?: Total ?Help needed climbing 3-5 steps with a railing? : Total ?6 Click Score: 10 ? ?  ?End of Session Equipment Utilized During Treatment: Oxygen ?Activity Tolerance: Patient limited by fatigue;Treatment limited secondary to medical complications (Comment) ?Patient left: in bed;with call bell/phone within reach ?Nurse Communication: Mobility status;Other (comment) (pt may be too weak for ALF) ?PT Visit Diagnosis: Unsteadiness on feet (R26.81);Muscle weakness (generalized) (M62.81);Difficulty in walking, not elsewhere classified (R26.2) ?  ? ?Time: 6948-5462 ?PT Time Calculation (min) (ACUTE ONLY): 31 min ? ? ?Charges:   PT Evaluation ?$PT Eval Moderate Complexity: 1 Mod ?PT Treatments ?$Therapeutic Activity: 8-22 mins ?  ?   ? ?Ramond Dial ?11/12/2021, 5:34 PM ? ?Mee Hives, PT PhD ?Acute Rehab Dept. Number: [phone] [phone] and [facility]  [phone] ? ?

## 2021-11-12 NOTE — Progress Notes (Signed)
FPTS Brief Progress Note ? ?S: Reassessed patient at bedside this morning.  Patient slightly more alert.  Does not really follow commands but was able to open left eye spontaneously and track.  Patient was able to answer yes/no questions.  Patient denied dyspnea, chest pain, cough.  Was not able to answer orientation questions. Mental status is improved compared to yesterday. ? ? ?O: ?BP (!) 118/59   Pulse 64   Temp 99.1 ?F (37.3 ?C) (Axillary)   Resp 20   Ht '5\' 8"'$  (1.727 m)   Wt 141 lb 12.1 oz (64.3 kg)   SpO2 95%   BMI 21.55 kg/m?   ? ? ?A/P: ?- Defer to day team for updated plans. ?- Orders reviewed. Labs for AM ordered, which was adjusted as needed.  ?- If condition changes, plan includes bedside eval.  ? ?France Ravens, MD ?11/12/2021, 6:45 AM ?PGY-1, [place] [facility] Night Resident  ?Please page [phone] with questions.  ? ?

## 2021-11-12 NOTE — Progress Notes (Addendum)
Met with patient's daughter at bedside. Patient's mental status is much improved compared to my exam earlier. She is now awake and answering questions appropriately. Oriented only to person and knows she is in the hospital. Reports she "feels lousy."  ?Discussed with daughter my concern for her unresponsiveness this morning. ?VBG reassuring, pCO2 within normal limits. Unsure what let her to be unresponsive this morning but suspect there is likely a component of delirium on top of her baseline dementia.  ?Discussed with daughter the uncertain prognosis at this time. We will continue to treat her pneumonia and hope that her mental status continues to improve. ?Daughter would be open to a Goals of Care discussion with palliative medicine. Had positive palliative care experiences with her father at the end of life.  ?- Continue CTX and Azithromycin ?- MRSA nasal swab ?- Consult to Palliative Care ?- If her mental status remains as it is now, can transition heparin back to Eliquis and re-start metoprolol later this afternoon ? ?Pearla Dubonnet, MD ?10:08 AM ? ?

## 2021-11-12 NOTE — Progress Notes (Signed)
ANTICOAGULATION CONSULT NOTE - Initial Consult ? ?Pharmacy Consult for Heparin ?Indication: atrial fibrillation ? ?Allergies  ?Allergen Reactions  ? Colcrys [Colchicine] Nausea Only  ?  Stomach upset ?  ? Lovastatin Other (See Comments)  ?  myalgia  ? ? ?Patient Measurements: ?Height: '5\' 8"'$  (172.7 cm) ?Weight: 64.3 kg (141 lb 12.1 oz) ?IBW/kg (Calculated) : 63.9 ? ?Vital Signs: ?BP: 118/59 (04/25 0530) ?Pulse Rate: 64 (04/25 0530) ? ?Labs: ?Recent Labs  ?  11/11/21 ?1640 11/11/21 ?2004 11/11/21 ?2242 11/12/21 ?0438  ?HGB 13.0  --   --  12.5  ?HCT 40.6  --   --  37.9  ?PLT 149*  --   --  123*  ?CREATININE 1.25*  --   --  1.35*  ?TROPONINIHS 45* 69* 61*  --   ? ? ?Estimated Creatinine Clearance: 27.4 mL/min (A) (by C-G formula based on SCr of 1.35 mg/dL (H)). ? ? ?Medical History: ?Past Medical History:  ?Diagnosis Date  ? Anxiety   ? Depression   ? Diabetes mellitus without complication ([place])   ? Hyperlipidemia   ? Hypertension   ? Renal disorder   ? Kidney disease  ? Vitamin D deficiency   ? ? ?Medications:  ?No current facility-administered medications on file prior to encounter.  ? ?Current Outpatient Medications on File Prior to Encounter  ?Medication Sig Dispense Refill  ? acetaminophen (TYLENOL) 325 MG tablet Take 650 mg by mouth 2 (two) times daily.    ? allopurinol (ZYLOPRIM) 100 MG tablet Take 100 mg by mouth daily.    ? amLODipine (NORVASC) 5 MG tablet Take 5 mg by mouth daily.    ? apixaban (ELIQUIS) 2.5 MG TABS tablet Take 1 tablet (2.5 mg total) by mouth 2 (two) times daily. 60 tablet 0  ? atorvastatin (LIPITOR) 10 MG tablet Take 10 mg by mouth at bedtime.    ? metoprolol tartrate (LOPRESSOR) 25 MG tablet Take 1 tablet (25 mg total) by mouth every 8 (eight) hours. 90 tablet 0  ? mirtazapine (REMERON) 7.5 MG tablet Take 7.5 mg by mouth at bedtime.    ? nystatin ointment (MYCOSTATIN) Apply 1 application. topically 2 (two) times daily. Left Breast    ? pantoprazole (PROTONIX) 40 MG tablet Take 40 mg by  mouth daily.    ? Turmeric 500 MG CAPS Take 500 mg by mouth daily.    ? Vitamin D, Ergocalciferol, (DRISDOL) 1.25 MG (50000 UNIT) CAPS capsule Take 50,000 Units by mouth every Tuesday.    ? traMADol (ULTRAM) 50 MG tablet Take 1 tablet (50 mg total) by mouth every 12 (twelve) hours as needed. (Patient not taking: Reported on 11/11/2021) 7 tablet 0  ?  ? ?Assessment: ?88 y.o. female with recent onset AFlutter, Eliquis on hold, for heparin.  Last Eliquis dose morning of 4/24 ?  ?Goal of Therapy:  ?aPTT 66-102 sec ?Heparin level 0.3-0.7 units/ml ?Monitor platelets by anticoagulation protocol: Yes ?  ?Plan:  ?Once baseline anticoagulation labs drawn, start heparin 800 units/hr ?aPTT in 8 hours ? ?Garvey Westcott, Bronson Curb ?11/12/2021,8:02 AM ? ? ?

## 2021-11-12 NOTE — Progress Notes (Signed)
FPTS Brief Progress Note ? ?S: Patient seen and assessed at bedside.  Daughter at bedside.  Daughter reports patient is doing much better and back to baseline.  Able to have multiword sentences without being dyspneic.  Very comfortable on 2 L nasal cannula.  All questions were addressed.  Patient did have a very wet sounding cough during assessment. ? ? ?O: ?BP (!) 158/43   Pulse (!) 49   Temp 97.8 ?F (36.6 ?C) (Oral)   Resp 17   Ht '5\' 8"'$  (1.727 m)   Wt 141 lb 12.1 oz (64.3 kg)   SpO2 99%   BMI 21.55 kg/m?   ? ? ?A/P: ?-Consider Incentive spirometer ?- Orders reviewed. Labs for AM ordered, which was adjusted as needed.  ?- If condition changes, plan includes bedside eval.  ? ?France Ravens, MD ?11/12/2021, 11:23 PM ?PGY-1, [place] [facility] Night Resident  ?Please page [phone] with questions.  ? ?

## 2021-11-12 NOTE — ED Notes (Signed)
Admit MD at bedside

## 2021-11-13 ENCOUNTER — Encounter (HOSPITAL_COMMUNITY): Payer: Self-pay | Admitting: Student

## 2021-11-13 ENCOUNTER — Other Ambulatory Visit: Payer: Self-pay

## 2021-11-13 ENCOUNTER — Ambulatory Visit (HOSPITAL_COMMUNITY): Payer: Medicare Other | Admitting: Nurse Practitioner

## 2021-11-13 DIAGNOSIS — Z515 Encounter for palliative care: Secondary | ICD-10-CM | POA: Diagnosis not present

## 2021-11-13 DIAGNOSIS — I10 Essential (primary) hypertension: Secondary | ICD-10-CM

## 2021-11-13 DIAGNOSIS — Z66 Do not resuscitate: Secondary | ICD-10-CM

## 2021-11-13 DIAGNOSIS — I4821 Permanent atrial fibrillation: Secondary | ICD-10-CM | POA: Diagnosis not present

## 2021-11-13 DIAGNOSIS — N179 Acute kidney failure, unspecified: Secondary | ICD-10-CM

## 2021-11-13 DIAGNOSIS — R6520 Severe sepsis without septic shock: Secondary | ICD-10-CM

## 2021-11-13 DIAGNOSIS — Z7189 Other specified counseling: Secondary | ICD-10-CM | POA: Diagnosis not present

## 2021-11-13 DIAGNOSIS — N1832 Chronic kidney disease, stage 3b: Secondary | ICD-10-CM | POA: Diagnosis not present

## 2021-11-13 DIAGNOSIS — H01003 Unspecified blepharitis right eye, unspecified eyelid: Secondary | ICD-10-CM | POA: Diagnosis present

## 2021-11-13 DIAGNOSIS — H01002 Unspecified blepharitis right lower eyelid: Secondary | ICD-10-CM | POA: Diagnosis not present

## 2021-11-13 DIAGNOSIS — E119 Type 2 diabetes mellitus without complications: Secondary | ICD-10-CM

## 2021-11-13 DIAGNOSIS — J189 Pneumonia, unspecified organism: Secondary | ICD-10-CM | POA: Diagnosis not present

## 2021-11-13 DIAGNOSIS — H9190 Unspecified hearing loss, unspecified ear: Secondary | ICD-10-CM

## 2021-11-13 HISTORY — DX: Unspecified hearing loss, unspecified ear: H91.90

## 2021-11-13 HISTORY — DX: Chronic kidney disease, stage 3b: N18.32

## 2021-11-13 LAB — BASIC METABOLIC PANEL
Anion gap: 7 (ref 5–15)
BUN: 29 mg/dL — ABNORMAL HIGH (ref 8–23)
CO2: 21 mmol/L — ABNORMAL LOW (ref 22–32)
Calcium: 8 mg/dL — ABNORMAL LOW (ref 8.9–10.3)
Chloride: 107 mmol/L (ref 98–111)
Creatinine, Ser: 1.4 mg/dL — ABNORMAL HIGH (ref 0.44–1.00)
GFR, Estimated: 36 mL/min — ABNORMAL LOW (ref >60)
Glucose, Bld: 167 mg/dL — ABNORMAL HIGH (ref 70–99)
Potassium: 4.4 mmol/L (ref 3.5–5.1)
Sodium: 135 mmol/L (ref 135–145)

## 2021-11-13 LAB — MRSA NEXT GEN BY PCR, NASAL: MRSA by PCR Next Gen: NOT DETECTED

## 2021-11-13 MED ORDER — SULFACETAMIDE SODIUM 10 % OP SOLN
1.0000 [drp] | Freq: Four times a day (QID) | OPHTHALMIC | Status: DC
  Administered 2021-11-13: 1 [drp] via OPHTHALMIC
  Filled 2021-11-13: qty 15

## 2021-11-13 MED ORDER — CEFDINIR 300 MG PO CAPS
300.0000 mg | ORAL_CAPSULE | Freq: Every day | ORAL | Status: DC
  Administered 2021-11-13 – 2021-11-14 (×2): 300 mg via ORAL
  Filled 2021-11-13 (×2): qty 1

## 2021-11-13 MED ORDER — CEFDINIR 300 MG PO CAPS
300.0000 mg | ORAL_CAPSULE | Freq: Every day | ORAL | Status: DC
Start: 2021-11-13 — End: 2021-11-13

## 2021-11-13 MED ORDER — AZITHROMYCIN 500 MG PO TABS: 500.0000 mg | ORAL_TABLET | Freq: Every day | ORAL | Status: DC

## 2021-11-13 MED ORDER — AZITHROMYCIN 500 MG PO TABS
500.0000 mg | ORAL_TABLET | Freq: Every day | ORAL | Status: DC
  Administered 2021-11-13 – 2021-11-14 (×2): 500 mg via ORAL
  Filled 2021-11-13 (×2): qty 1

## 2021-11-13 MED ORDER — SULFACETAMIDE SODIUM 10 % OP SOLN
1.0000 [drp] | Freq: Four times a day (QID) | OPHTHALMIC | Status: DC
  Administered 2021-11-13 – 2021-11-14 (×6): 1 [drp] via OPHTHALMIC

## 2021-11-13 NOTE — Progress Notes (Signed)
Mobility Specialist Progress Note  ? ? 11/13/21 1109  ?Mobility  ?Activity Ambulated with assistance in room  ?Level of Assistance Minimal assist, patient does 75% or more  ?Assistive Device Front wheel walker  ?Distance Ambulated (ft) 8 ft  ?Activity Response Tolerated well  ?$Mobility charge 1 Mobility  ? ?Pt received in bed and agreeable w/ OT present. C/o L arm pain. Needed verbal cues and minA for bed mobility. MinA to stand and cueing for steps. Left in chair for pericare with OT and RN.  ? ?Emily Waller ?Mobility Specialist  ?Primary: 5N M.S. Phone: [phone] ?Secondary: 6N M.S. Phone: [phone] ?  ?

## 2021-11-13 NOTE — TOC Progression Note (Addendum)
Transition of Care (TOC) - Progression Note  ? ? ?Patient Details  ?Name: Emily Waller ?MRN: [id number] ?Date of Birth: 02/09/1928 ? ?Transition of Care (TOC) CM/SW Contact  ?Sharin Mons, RN ?Phone Number: ?11/13/2021, 3:41 PM ? ?Clinical Narrative:    ?NCM received secured chat message from palliative liaison stating family has agreed to move  forward with hospice care provided by [facility] @ [facility] Care Unit. NCM called and confirmed with daughter, [name]. [name] stated they would like pt to return to [facility] with [facility]. [name] stated [facility] is who they would like to be the provider. ?NCM called Levada Dy [phone] , [place] liaison regarding hospice referral. Levada Dy stated ordering DME (? hospital bed, oxygen) and should be ready to receive on tomorrow. Carmelia Roller is DON [facility]  [place], [phone].  ?NCM shared above information with CSW to follow. ? ?Manuella Ghazi (Daughter)      ?[phone]     ? ?Expected Discharge Plan: Memory Care ([facility] with hospice care provided by [facility]) ?Barriers to Discharge: Other (must enter comment) ? ?Expected Discharge Plan and Services ?Expected Discharge Plan: Memory Care ([place] [facility] with hospice care provided by [facility]) ?  ?  ?  ?  ?                ?  ?  ?  ?  ?  ?  ?  ?  ?  ?  ? ? ?Social Determinants of Health (SDOH) Interventions ?  ? ?Readmission Risk Interventions ?   ? View : No data to display.  ?  ?  ?  ? ? ?

## 2021-11-13 NOTE — Progress Notes (Signed)
SLP Cancellation Note ? ?Patient Details ?Name: Emily Waller ?MRN: [id number] ?DOB: [birth year] ? ? ?Cancelled treatment:       Reason Eval/Treat Not Completed: Other (comment). Pt sleeping at 1030 - helped daughter order a meal for her. SLP returned and pt alert up in chair with OT. Called daughter later who reported she cleaned her plate without difficulty. Recommend pt continue current diet. No f/u with SLP needed.  ? ? ?Osmar Howton, Katherene Ponto ?11/13/2021, 3:27 PM ?

## 2021-11-13 NOTE — Plan of Care (Signed)
?  Problem: Activity: ?Goal: Ability to tolerate increased activity will improve ?Outcome: Adequate for Discharge ?  ?Problem: Clinical Measurements: ?Goal: Ability to maintain a body temperature in the normal range will improve ?Outcome: Adequate for Discharge ?  ?Problem: Respiratory: ?Goal: Ability to maintain adequate ventilation will improve ?Outcome: Adequate for Discharge ?Goal: Ability to maintain a clear airway will improve ?Outcome: Adequate for Discharge ?  ?

## 2021-11-13 NOTE — Progress Notes (Addendum)
CSW spoke with [place] at [facility] who confirmed pt can return with hospice services.  Please fax FL2 and DC summary to [phone]. ?Lurline Idol, MSW, LCSW ?4/26/20233:39 PM  ? ?CSW spoke with Levada Dy at [facility], [phone].  She is aware of plan for DC tomorrow, no hospital bed needed, she will order O2. ?Lurline Idol, MSW, LCSW ?4/26/20233:41 PM  ?

## 2021-11-13 NOTE — Consult Note (Signed)
?Consultation Note ?Date: 11/13/2021  ? ?Patient Name: Emily Waller  ?DOB: [birth year]  MRN: [id number]  Age / Sex: 88 y.o., female  ?PCP: Patient, No Pcp Per (Inactive) ?Referring Physician: McDiarmid, Blane Ohara, MD ? ?Reason for Consultation: Establishing goals of care ? ?HPI/Patient Profile: 88 y.o. female  with past medical history of Alzheimer's disease, aflutter, hypertension, Gout, hyperlipidemia, diabetes, and anxiety/depression admitted on 11/11/2021 with respiratory failure.  Diagnosed with pneumonia and sepsis.  Mental status has fluctuated throughout admission with periods of unresponsiveness.  PMT consulted to discuss goals of care. ? ?Clinical Assessment and Goals of Care: ?I have reviewed medical records including EPIC notes, labs and imaging, assessed the patient and then met with Patient's daughter [name]  to discuss diagnosis prognosis, [place], EOL wishes, disposition and options. ? ?I introduced Palliative Medicine as specialized medical care for people living with serious illness. It focuses on providing relief from the symptoms and stress of a serious illness. The goal is to improve quality of life for both the patient and the family. ? ?Daughter tells me patient has been at [facility] since beginning of this year.  Prior to that she was at a different assisted living facility for a few years.  Daughter tells me a decline in patient's function however she does work with therapy daily.  Mostly sedentary when not working with therapy.  She tells me patient has maintained a good appetite.  She tells me patient remains conversational she believes her dementia is moderate. ? ? We discussed patient's current illness and what it means in the larger context of patient's on-going co-morbidities.  Natural disease trajectory and expectations at EOL were discussed.  We discussed patient's dementia and expectations in the moderate stage.  We discussed patient's  recent hospitalization for a flutter.  We discussed severity of this hospitalization and daughter shares her thoughts that patient would pass away as she looked so poor at one point. ? ?I attempted to elicit values and goals of care important to the patient.  Daughter tells me patient's quality of life is very important to her.  Daughter tells me patient has expressed wishes in the past that she not receive aggressive medical care as she nears the end of life.  Patient would likely prefer to avoid further hospitalizations. ? ?The difference between aggressive medical intervention and comfort care was considered in light of the patient's goals of care.  ? ?Advance directives, concepts specific to code status, artificial feeding and hydration, and rehospitalization were considered and discussed. ? ? ?I completed a MOST form today. The patient and family outlined their wishes for the following treatment decisions: ? ?Cardiopulmonary Resuscitation: Do Not Attempt Resuscitation (DNR/No CPR)  ?Medical Interventions: Comfort Measures: Keep clean, warm, and dry. Use medication by any route, positioning, wound care, and other measures to relieve pain and suffering. Use oxygen, suction and manual treatment of airway obstruction as needed for comfort. Do not transfer to the hospital unless comfort needs cannot be met in current location.  ?Antibiotics: Determine use of limitation of antibiotics when infection occurs  ?IV Fluids: No IV fluids (provide other measures to ensure comfort)  ?Feeding Tube: No feeding tube  ? ? ? ?Discussed with daughter the importance of continued conversation with family and the medical providers regarding overall plan of care and treatment options, ensuring decisions are within the context of the patient?s values and GOCs.   ? ?Hospice and Palliative Care services outpatient were explained and offered.  Philosophy of hospice  care and type of services provided were discussed with daughter.  Daughter  is interested in a hospice referral for patient to receive hospice care at [facility]. ? ?Questions and concerns were addressed. The family was encouraged to call with questions or concerns. ? ?Primary Decision Maker ?HCPOA -daughter [name] ?  ? ?SUMMARY OF RECOMMENDATIONS   ?-MOST form completed as above -no further hospitalizations, focus on comfort at facility ?-Return to [facility] with hospice support -discussed with [facility] in the use [facility] for hospice - [facility] liaison Levada Dy given referral ? ?Code Status/Advance Care Planning: ?DNR ? ?Discharge Planning:  ALF with hospice   ? ?  ? ?Primary Diagnoses: ?Present on Admission: ? CAP (community acquired pneumonia) ? Blepharitis of eyelid of right eye ? Atrial fibrillation, permanent ([place]) ? HTN (hypertension) ? Chronic kidney disease, stage 3b ([place]) ? Hypoxia ? Sepsis ([place]) ? ? ?I have reviewed the medical record, interviewed the patient and family, and examined the patient. The following aspects are pertinent. ? ?Past Medical History:  ?Diagnosis Date  ? Anxiety   ? Atrial flutter with rapid ventricular response ([place])   ? Bilateral pseudophakia 01/24/2016  ? Choroidal nevus, left eye 01/24/2016  ? Chronic kidney disease, stage 3b ([place]) 11/13/2021  ? Colon polyp 01/29/2016  ? Depression   ? Diabetes mellitus without complication ([place])   ? Dry eye syndrome of both lacrimal glands 01/24/2016  ? Epiretinal membrane (ERM) of right eye 01/24/2016  ? Gout, unspecified 05/03/2014  ? Formatting of this note might be different from the original. On allopurinol. Formatting of this note might be different from the original. Overview:  On allopurinol.  ? Hard of hearing 11/13/2021  ? History of TIAs 01/24/2016  ? Hyperlipidemia   ? Hypertension   ? Posterior capsular opacification non visually significant of left eye 01/24/2016  ? Renal disorder   ? Kidney disease  ? Transient diplopia 01/24/2016  ? Vitamin D deficiency   ? ?Social History  ? ?Socioeconomic History  ? Marital  status: Widowed  ?  Spouse name: Not on file  ? Number of children: Not on file  ? Years of education: Not on file  ? Highest education level: Not on file  ?Occupational History  ? Not on file  ?Tobacco Use  ? Smoking status: Former  ?  Types: Cigarettes  ?  Quit date: 10/02/1969  ?  Years since quitting: 52.1  ? Smokeless tobacco: Never  ?Vaping Use  ? Vaping Use: Never used  ?Substance and Sexual Activity  ? Alcohol use: No  ?  Alcohol/week: 0.0 standard drinks  ? Drug use: No  ? Sexual activity: Not Currently  ?Other Topics Concern  ? Not on file  ?Social History Narrative  ? Not on file  ? ?Social Determinants of Health  ? ?Financial Resource Strain: Not on file  ?Food Insecurity: Not on file  ?Transportation Needs: Not on file  ?Physical Activity: Not on file  ?Stress: Not on file  ?Social Connections: Not on file  ? ?Family History  ?Problem Relation Age of Onset  ? Hyperlipidemia Mother   ? Hypertension Mother   ? Cancer Father   ? Heart disease Father   ? CAD Maternal Grandmother   ? CAD Maternal Grandfather   ? Lung cancer Sister   ? ?Scheduled Meds: ? apixaban  2.5 mg Oral BID  ? azithromycin  500 mg Oral Daily  ? cefdinir  300 mg Oral Daily  ? mouth rinse  15 mL Mouth Rinse BID  ? sulfacetamide  1 drop Right Eye QID  ? ?Continuous Infusions: ?PRN Meds:.acetaminophen **OR** acetaminophen ?Allergies  ?Allergen Reactions  ? Colcrys [Colchicine] Nausea Only  ?  Stomach upset ?  ? Lovastatin Other (See Comments)  ?  myalgia  ? ?Review of Systems  ?Unable to perform ROS: Dementia  ? ?Physical Exam ?Constitutional:   ?   General: She is not in acute distress. ?   Appearance: She is ill-appearing.  ?Pulmonary:  ?   Effort: Pulmonary effort is normal.  ?Skin: ?   General: Skin is warm and dry.  ?Neurological:  ?   Mental Status: She is alert. She is disoriented.  ? ? ?Vital Signs: BP (!) 142/60 (BP Location: Right Arm)   Pulse 61   Temp 98.3 ?F (36.8 ?C) (Oral)   Resp 20   Ht 5' 8"  (1.727 m)   Wt 64.3 kg    SpO2 99%   BMI 21.55 kg/m?  ?Pain Scale: PAINAD ?POSS *See Group Information*: S-Acceptable,Sleep, easy to arouse ?Pain Score: Asleep ? ? ?SpO2: SpO2: 99 % ?O2 Device:SpO2: 99 % ?O2 Flow Rate: .O2 Flow Rate (L/m

## 2021-11-13 NOTE — Plan of Care (Signed)
Calm and cooperative. Sats stable, weaned to 1 l n/c. Intermittent, non productive cough. Sinus Brady after pm lopressor (BP stable), held am dose.  ? ?RUE PIV infiltrated, hot pack and prn tylenol. Taking pills well crushed in applesauce, tolerating thin liquids with no coughing/sxs of aspiration.  ? ?DNR noted.  ? ?Problem: Clinical Measurements: ?Goal: Ability to maintain a body temperature in the normal range will improve ?Outcome: Progressing ?  ?Problem: Respiratory: ?Goal: Ability to maintain adequate ventilation will improve ?Outcome: Progressing ?  ?

## 2021-11-13 NOTE — Evaluation (Signed)
Occupational Therapy Evaluation and Discharge ?Patient Details ?Name: Emily Waller ?MRN: [id number] ?DOB: 29-Mar-1928 ?Today's Date: 11/13/2021 ? ? ?History of Present Illness 88 yo female with onset of SOB and hypoxia was admitted 4/24 for dramatic changes including being non communicative.  Pt was found to have acute hypoxic resp failure, PNA, sepsis, SIRS, new O2 need.  Pt is from memory care. PMHx:  DM, a-flutter, Vit D deficiency, HTN,  ? ?Clinical Impression ?  ?Pt ambulated with a RW, self fed and was otherwise dependent in ADLs and IADLs at her memory care facility. Pt presents with painful, swollen R UE and limited ability to use, generalized weakness and impaired standing balance. She needs moderate assistance to stand and min assist to ambulate short distance in room. Pt self fed with set up using her L hand once seated in chair. Recommending pt return to ALF if her facility is able to manage her current level of care and mobility. Pt's daughter observed session and believes her current facility is appropriate. No further OT needs.  ?   ? ?Recommendations for follow up therapy are one component of a multi-disciplinary discharge planning process, led by the attending physician.  Recommendations may be updated based on patient status, additional functional criteria and insurance authorization.  ? ?Follow Up Recommendations ? Other (comment) (return to memory care facility)  ?  ?Assistance Recommended at Discharge Frequent or constant Supervision/Assistance  ?Patient can return home with the following A little help with walking and/or transfers;A lot of help with bathing/dressing/bathroom;Assistance with feeding ? ?  ?Functional Status Assessment ? Patient has had a recent decline in their functional status and/or demonstrates limited ability to make significant improvements in function in a reasonable and predictable amount of time  ?Equipment Recommendations ? None recommended by OT  ?  ?Recommendations for  Other Services   ? ? ?  ?Precautions / Restrictions Precautions ?Precautions: Fall ?Precaution Comments: monitor HR and O2 sats ?Restrictions ?Weight Bearing Restrictions: No  ? ?  ? ?Mobility Bed Mobility ?Overal bed mobility: Needs Assistance ?Bed Mobility: Supine to Sit ?  ?  ?Supine to sit: Min assist ?  ?  ?General bed mobility comments: multimodal cues for LEs to EOB and to raise trunk, assist with bed pad to pivot hips ?  ? ?Transfers ?Overall transfer level: Needs assistance ?Equipment used: Rolling walker (2 wheels) ?Transfers: Sit to/from Stand ?Sit to Stand: Mod assist ?  ?  ?  ?  ?  ?General transfer comment: assist to rise and steady, pulling up on walker, performed x 4, posterior bias, cues for upright posture ?  ? ?  ?Balance Overall balance assessment: Needs assistance ?Sitting-balance support: Feet supported ?Sitting balance-Leahy Scale: Fair ?  ?  ?Standing balance support: Bilateral upper extremity supported, During functional activity ?Standing balance-Leahy Scale: Poor ?Standing balance comment: B UE support and min assist ?  ?  ?  ?  ?  ?  ?  ?  ?  ?  ?  ?   ? ?ADL either performed or assessed with clinical judgement  ? ?ADL Overall ADL's : Needs assistance/impaired ?Eating/Feeding: Set up;Sitting ?Eating/Feeding Details (indicate cue type and reason): self fed with L UE ?  ?  ?  ?  ?  ?  ?  ?  ?  ?  ?  ?  ?  ?  ?  ?  ?Functional mobility during ADLs: Minimal assistance;Rolling walker (2 wheels) ?General ADL Comments: dependent in bathing, dressing and toileting  at baseline  ? ? ? ?Vision Ability to See in Adequate Light: 0 Adequate ?Patient Visual Report: No change from baseline ?   ?   ?Perception   ?  ?Praxis   ?  ? ?Pertinent Vitals/Pain Pain Assessment ?Pain Assessment: Faces ?Faces Pain Scale: Hurts even more ?Pain Location: R UE ?Pain Descriptors / Indicators: Aching ?Pain Intervention(s): Monitored during session, Repositioned  ? ? ? ?Hand Dominance Right ?  ?Extremity/Trunk Assessment  Upper Extremity Assessment ?Upper Extremity Assessment: RUE deficits/detail ?RUE Deficits / Details: edema, pain, limited functional use ?RUE: Unable to fully assess due to pain ?RUE Coordination: decreased fine motor;decreased gross motor ?  ?Lower Extremity Assessment ?Lower Extremity Assessment: Defer to PT evaluation ?  ?Cervical / Trunk Assessment ?Cervical / Trunk Assessment: Kyphotic ?  ?Communication Communication ?Communication: HOH ?  ?Cognition Arousal/Alertness: Awake/alert ?Behavior During Therapy: [facility] for tasks assessed/performed ?Overall Cognitive Status: History of cognitive impairments - at baseline ?  ?  ?  ?  ?  ?  ?  ?  ?  ?  ?  ?  ?  ?  ?  ?  ?  ?  ?  ?General Comments    ? ?  ?Exercises   ?  ?Shoulder Instructions    ? ? ?Home Living Family/patient expects to be discharged to:: Assisted living (Memory care) ?  ?  ?  ?  ?  ?  ?  ?  ?  ?  ?  ?  ?  ?  ?Home Equipment: Conservation officer, nature (2 wheels);Shower seat;Grab bars - toilet;Grab bars - tub/shower;Hand held shower head ?  ?  ?  ? ?  ?Prior Functioning/Environment Prior Level of Function : Needs assist ?  ?  ?  ?  ?  ?  ?Mobility Comments: walked withy RW ?ADLs Comments: assisted for all ADLs and IADLs with exception of self feeding ?  ? ?  ?  ?OT Problem List: Decreased strength ?  ?   ?OT Treatment/Interventions:    ?  ?OT Goals(Current goals can be found in the care plan section) Acute Rehab OT Goals ?OT Goal Formulation: With family  ?OT Frequency:   ?  ? ?Co-evaluation   ?  ?  ?  ?  ? ?  ?AM-PAC OT "6 Clicks" Daily Activity     ?Outcome Measure Help from another person eating meals?: A Little ?Help from another person taking care of personal grooming?: A Little ?Help from another person toileting, which includes using toliet, bedpan, or urinal?: Total ?Help from another person bathing (including washing, rinsing, drying)?: Total ?Help from another person to put on and taking off regular upper body clothing?: Total ?Help from another person to  put on and taking off regular lower body clothing?: Total ?6 Click Score: 10 ?  ?End of Session Equipment Utilized During Treatment: Rolling walker (2 wheels);Gait belt;Oxygen (1L) ?Nurse Communication: Mobility status ? ?Activity Tolerance: Patient tolerated treatment well ?Patient left: in chair;with call bell/phone within reach;with chair alarm set ? ?OT Visit Diagnosis: Other abnormalities of gait and mobility (R26.89)  ?              ?Time: 4287-6811 ?OT Time Calculation (min): 41 min ?Charges:  OT General Charges ?$OT Visit: 1 Visit ?OT Evaluation ?$OT Eval Moderate Complexity: 1 Mod ?OT Treatments ?$Self Care/Home Management : 23-37 mins ? ?Nestor Lewandowsky, OTR/L ?Acute Rehabilitation Services ?Pager: [phone] ?Office: [phone]  ? ?Malka So ?11/13/2021, 11:27 AM ?

## 2021-11-13 NOTE — Progress Notes (Signed)
[facility] Teaching [facility] ?Daily Progress Note ?Intern Pager: [phone] ? ?Patient name: Emily Waller Medical record number: [id number] ?Date of birth: Sep 27, 1927 Age: 88 y.o. Gender: female ? ?Primary Care Provider: Patient, No Pcp Per (Inactive) ?Consultants: Palliative Care ?Code Status: DNR ? ?Pt Overview and Major Events to Date:  ?4/24- admitted ? ?Assessment and Plan: ?AVINA EBERLE is a 88 y.o. female presenting with acute respiratory failure secondary to pneumonia. She was recently admitted to our service for new-onset atrial flutter. PMH is significant for Alzheimer's disease, aflutter, hypertension, Gout, hyperlipidemia, diabetes, and anxiety/depression ? ?Acute Hypoxic Respiratory Failure 2/2 Pneumonia, improving ?Meeting Sepsis Criteria on admission, resolved ? Patient remained afebrile overnight. Breathing comfortably on 1L [place]. Patient was off O2 when I assessed her but sats were in mid-80s, recovered well with increase to 1L supplemental O2. MRSA swab negative. S pneumo urine antigen neg. Unfortunately, blood cultures ordered at admission were not collected until yesterday, after abx had been administered.  ?- Continue abx, can transition to cefdinir and oral azithromycin ?- Blood culture pending but unlikely to yield ?- Tylenol PRN for fever/pain ?- PT/OT recommending return to SNF memory unit ? ?Obtunded on arrival, resolved ?Alzheimer's Disease ?Mental status is back at baseline. Patient able to communicate her needs, taking oral meds. Palliative consulted yesterday due to decreased responsiveness, patient/family still interested in clarifying goals of care moving forward.  ?- Fall and delirium precautions ?- D/c fluids now that eating/drinking ?- Palliative to see  ? ?Hx of atrial flutter ?Bradycardic overnight, metoprolol held. ?- Holding metoprolol for bradycardia, consider restarting at '25mg'$  q8h ?- Continue home Eliquis ? ?HTN ?Last BP 154/49. ?- Holding metoprolol for HR, monitor as  above ? ? ?FEN/GI: Dys 3 ?PPx: On Eliquis ?Dispo:SNF  1-2 days . Barriers include Weaning off O2, transition to oral abx ? ?Subjective:  ?Emily Waller was sleeping heavily upon my entering the room.  O2 saturation was 84% at that time but her O2 flow had been turned off.  I increased it to 1 L and her O2 sats recovered appropriately.  She awakened to soft tactile stimulation and reported feeling well.  She has no acute complaints this morning.  She would like to go home. ? ?Objective: ?Temp:  [97.8 ?F (36.6 ?C)-99.5 ?F (37.5 ?C)] 97.8 ?F (36.6 ?C) (04/26 0525) ?Pulse Rate:  [47-71] 51 (04/26 0525) ?Resp:  [16-24] 19 (04/26 0525) ?BP: (122-158)/(40-76) 154/49 (04/26 0525) ?SpO2:  [89 %-100 %] 98 % (04/26 0525) ?Physical Exam: ?General: Sleeping on arrival, awakens to tactile stimuli ?Cardiovascular: Bradycardic, regular, 2/6 systolic murmur ?Respiratory: Normal WOB on 1L, able to speak in full sentences, still with crackles on R ?Abdomen: Soft, non-tender, non-distended ?Extremities: Without edema or deformity  ? ?Laboratory: ?Recent Labs  ?Lab 11/11/21 ?1640 11/12/21 ?7680  ?WBC 12.7* 10.1  ?HGB 13.0 12.5  ?HCT 40.6 37.9  ?PLT 149* 123*  ? ?Recent Labs  ?Lab 11/11/21 ?1640 11/12/21 ?0438 11/13/21 ?0145  ?NA 135 136 135  ?K 5.1 4.1 4.4  ?CL 105 104 107  ?CO2 22 24 21*  ?BUN 29* 29* 29*  ?CREATININE 1.25* 1.35* 1.40*  ?CALCIUM 8.5* 8.3* 8.0*  ?PROT  --  6.5  --   ?BILITOT  --  0.6  --   ?ALKPHOS  --  79  --   ?ALT  --  12  --   ?AST  --  14*  --   ?GLUCOSE 195* 171* 167*  ? ? ? ?Imaging/Diagnostic Tests: ?CT HEAD  WO CONTRAST (5MM) ?CLINICAL DATA:  Altered mental status ? ?EXAM: ?CT HEAD WITHOUT CONTRAST ? ?TECHNIQUE: ?Contiguous axial images were obtained from the base of the skull ?through the vertex without intravenous contrast. ? ?RADIATION DOSE REDUCTION: This exam was performed according to the ?departmental dose-optimization program which includes automated ?exposure control, adjustment of the mA and/or kV  according to ?patient size and/or use of iterative reconstruction technique. ? ?COMPARISON:  02/17/2021 ? ?FINDINGS: ?Brain: No evidence of acute infarction, hemorrhage, hydrocephalus, ?extra-axial collection or mass lesion/mass effect. ? ?Mild cortical and central atrophy.  Stable ventricular prominence. ? ?Subcortical white matter and periventricular small vessel ischemic ?changes. 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 ? ?Vascular: Intracranial atherosclerosis. ? ?Skull: Normal. Negative for fracture or focal lesion. ? ?Sinuses/Orbits: Partial opacification of the bilateral mastoid air ?cells. Visualized paranasal sinuses are clear. ? ?Other: None. ? ?IMPRESSION: ?No evidence of acute intracranial abnormality. Mild atrophy with ?small vessel ischemic changes. 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 ? ?Electronically Signed ?  By: Julian Hy M.D. ?  On: 11/12/2021 00:16 ? ? ? ?Emily Gibson, MD ?11/13/2021, 8:53 AM ?PGY-1, [facility] ?[facility] Intern pager: [phone], text pages welcome ? ?

## 2021-11-13 NOTE — Progress Notes (Signed)
FPTS Brief Note ?Reviewed patient's vitals, recent notes.  ?Vitals:  ? 11/13/21 1558 11/13/21 1923  ?BP: (!) 142/60 (!) 165/54  ?Pulse: 61 72  ?Resp: 20 20  ?Temp: 98.3 ?F (36.8 ?C) 98.2 ?F (36.8 ?C)  ?SpO2: 99% 97%  ? ?At this time, no change in plan from day progress note.  ?France Ravens, MD ?Page [phone] with questions about this patient.  ? ?

## 2021-11-14 DIAGNOSIS — N1832 Chronic kidney disease, stage 3b: Secondary | ICD-10-CM | POA: Diagnosis not present

## 2021-11-14 DIAGNOSIS — I4821 Permanent atrial fibrillation: Secondary | ICD-10-CM | POA: Diagnosis not present

## 2021-11-14 DIAGNOSIS — J189 Pneumonia, unspecified organism: Secondary | ICD-10-CM | POA: Diagnosis not present

## 2021-11-14 DIAGNOSIS — H01002 Unspecified blepharitis right lower eyelid: Secondary | ICD-10-CM | POA: Diagnosis not present

## 2021-11-14 LAB — BASIC METABOLIC PANEL
Anion gap: 8 (ref 5–15)
BUN: 27 mg/dL — ABNORMAL HIGH (ref 8–23)
CO2: 23 mmol/L (ref 22–32)
Calcium: 8.5 mg/dL — ABNORMAL LOW (ref 8.9–10.3)
Chloride: 105 mmol/L (ref 98–111)
Creatinine, Ser: 1.15 mg/dL — ABNORMAL HIGH (ref 0.44–1.00)
GFR, Estimated: 45 mL/min — ABNORMAL LOW (ref >60)
Glucose, Bld: 136 mg/dL — ABNORMAL HIGH (ref 70–99)
Potassium: 4.2 mmol/L (ref 3.5–5.1)
Sodium: 136 mmol/L (ref 135–145)

## 2021-11-14 LAB — CBC
HCT: 32.7 % — ABNORMAL LOW (ref 36.0–46.0)
Hemoglobin: 10.8 g/dL — ABNORMAL LOW (ref 12.0–15.0)
MCH: 31.9 pg (ref 26.0–34.0)
MCHC: 33 g/dL (ref 30.0–36.0)
MCV: 96.5 fL (ref 80.0–100.0)
Platelets: 105 10*3/uL — ABNORMAL LOW (ref 150–400)
RBC: 3.39 MIL/uL — ABNORMAL LOW (ref 3.87–5.11)
RDW: 13 % (ref 11.5–15.5)
WBC: 6.9 10*3/uL (ref 4.0–10.5)
nRBC: 0 % (ref 0.0–0.2)

## 2021-11-14 LAB — LEGIONELLA PNEUMOPHILA SEROGP 1 UR AG: L. pneumophila Serogp 1 Ur Ag: NEGATIVE

## 2021-11-14 MED ORDER — SULFACETAMIDE SODIUM 10 % OP SOLN: 1.0000 [drp] | Freq: Four times a day (QID) | OPHTHALMIC | 0 refills | Status: AC

## 2021-11-14 MED ORDER — CEFDINIR 300 MG PO CAPS: 300.0000 mg | ORAL_CAPSULE | Freq: Every day | ORAL | Status: AC

## 2021-11-14 MED ORDER — AZITHROMYCIN 500 MG PO TABS: 500.0000 mg | ORAL_TABLET | Freq: Every day | ORAL | 0 refills | Status: AC

## 2021-11-14 NOTE — [place] FL2 (Signed)
?[place] MEDICAID FL2 LEVEL OF CARE SCREENING TOOL  ?  ? ?IDENTIFICATION  ?Patient Name: ?Emily Waller Birthdate: 1927-08-23 Sex: female Admission Date (Current Location): ?11/11/2021  ?[place] and [place] Number: ? [place] ?  Facility and Address:  ?[facility] [place]. [facility], [place] [address], [place], [place] [address] ?     Provider Number: ?[id number]  ?Attending Physician Name and Address:  ?McDiarmid, Blane Ohara, MD ? Relative Name and Phone Number:  ?Manuella Ghazi Daughter [phone] ?   ?Current Level of Care: ?Hospital Recommended Level of Care: ?Memory Care Prior Approval Number: ?  ? ?Date Approved/Denied: ?  PASRR Number: ?  ? ?Discharge Plan: ?Other (Comment) (Memory Care) ?  ? ?Current Diagnoses: ?Patient Active Problem List  ? Diagnosis Date Noted  ? Blepharitis of eyelid of right eye 11/13/2021  ? Hard of hearing 11/13/2021  ? Chronic kidney disease, stage 3b ([place]) 11/13/2021  ? Hypoxia   ? Elevated troponin   ? Sepsis ([place])   ? CAP (community acquired pneumonia) 11/11/2021  ? Atrial fibrillation, permanent ([place]) 11/04/2021  ? Diet-controlled diabetes mellitus ([place]) 07/07/2018  ? DDD (degenerative disc disease) 01/29/2016  ? GERD (gastroesophageal reflux disease) 01/29/2016  ? Incontinence 01/29/2016  ? HTN (hypertension) 09/12/2015  ? Depression 09/12/2015  ? Coronary atherosclerosis 02/02/2008  ? ? ?Orientation RESPIRATION BLADDER Height & Weight   ?  ?Self ? Normal Incontinent, External catheter Weight: 141 lb 12.1 oz (64.3 kg) ?Height:  '5\' 8"'$  (172.7 cm)  ?BEHAVIORAL SYMPTOMS/MOOD NEUROLOGICAL BOWEL NUTRITION STATUS  ?    Continent Diet (regular diet)  ?AMBULATORY STATUS COMMUNICATION OF NEEDS Skin   ?Total Care Verbally Normal ?  ?  ?  ?    ?     ?     ? ? ?Personal Care Assistance Level of Assistance  ?Bathing, Feeding, Dressing Bathing Assistance: Limited assistance ?Feeding assistance: Limited assistance ?Dressing Assistance: Limited assistance ?   ? ?Functional  Limitations Info  ?Sight, Hearing, Speech Sight Info: Adequate ?Hearing Info: Impaired ?Speech Info: Adequate  ? ? ?SPECIAL CARE FACTORS FREQUENCY  ?PT (By licensed PT), OT (By licensed OT)   ?  ?PT Frequency: evaluate and treat ?OT Frequency: evaluate and treat ?  ?  ?  ?   ? ? ?Contractures Contractures Info: Not present  ? ? ?Additional Factors Info  ?Code Status, Allergies Code Status Info: DNR ?Allergies Info: Colcrys (Colchicine), Lovastatin ?  ?  ?  ?   ? ?Current Medications (11/14/2021):  This is the current hospital active medication list ?Current Facility-Administered Medications  ?Medication Dose Route Frequency Provider Last Rate Last Admin  ? acetaminophen (TYLENOL) tablet 650 mg  650 mg Oral Q6H PRN France Ravens, MD   650 mg at 11/13/21 2116  ? Or  ? acetaminophen (TYLENOL) suppository 650 mg  650 mg Rectal Q6H PRN France Ravens, MD      ? apixaban Arne Cleveland) tablet 2.5 mg  2.5 mg Oral BID Eppie Gibson, MD   2.5 mg at 11/14/21 0848  ? azithromycin (ZITHROMAX) tablet 500 mg  500 mg Oral Daily Eppie Gibson, MD   500 mg at 11/14/21 0848  ? cefdinir (OMNICEF) capsule 300 mg  300 mg Oral Daily Eppie Gibson, MD   300 mg at 11/14/21 0848  ? MEDLINE mouth rinse  15 mL Mouth Rinse BID McDiarmid, Blane Ohara, MD   15 mL at 11/14/21 0848  ? sulfacetamide (BLEPH-10) 10 % ophthalmic solution 1 drop  1  drop Right Eye QID Eppie Gibson, MD   1 drop at 11/14/21 0848  ? ? ? ?Discharge Medications: ?Please see discharge summary for a list of discharge medications. ? ?Relevant Imaging Results: ? ?Relevant Lab Results: ? ? ?Additional Information ?SSN-[id number] ? ?Joanne Chars, LCSW ? ? ? ? ?

## 2021-11-14 NOTE — NC FL2 (Signed)
?[place] MEDICAID FL2 LEVEL OF CARE SCREENING TOOL  ?  ? ?IDENTIFICATION  ?Patient Name: ?Emily Waller Birthdate: 1928-06-30 Sex: female Admission Date (Current Location): ?11/11/2021  ?[place] and [place] Number: ? [place] ?  Facility and Address:  ?[facility] [place]. [facility], [place] [address] [place], [place] [address] ?     Provider Number: ?[id number]  ?Attending Physician Name and Address:  ?McDiarmid, Blane Ohara, MD ? Relative Name and Phone Number:  ?Manuella Ghazi Daughter [phone] ?   ?Current Level of Care: ?Hospital Recommended Level of Care: ?Memory Care Prior Approval Number: ?  ? ?Date Approved/Denied: ?  PASRR Number: ?  ? ?Discharge Plan: ?Other (Comment) (Memory Care) ?  ? ?Current Diagnoses: ?Patient Active Problem List  ? Diagnosis Date Noted  ? Blepharitis of eyelid of right eye 11/13/2021  ? Hard of hearing 11/13/2021  ? Chronic kidney disease, stage 3b ([place]) 11/13/2021  ? Hypoxia   ? Elevated troponin   ? Sepsis ([place])   ? CAP (community acquired pneumonia) 11/11/2021  ? Atrial fibrillation, permanent ([place]) 11/04/2021  ? Diet-controlled diabetes mellitus ([place]) 07/07/2018  ? DDD (degenerative disc disease) 01/29/2016  ? GERD (gastroesophageal reflux disease) 01/29/2016  ? Incontinence 01/29/2016  ? HTN (hypertension) 09/12/2015  ? Depression 09/12/2015  ? Coronary atherosclerosis 02/02/2008  ? ? ?Orientation RESPIRATION BLADDER Height & Weight   ?  ?Self ? Normal Incontinent, External catheter Weight: 141 lb 12.1 oz (64.3 kg) ?Height:  '5\' 8"'$  (172.7 cm)  ?BEHAVIORAL SYMPTOMS/MOOD NEUROLOGICAL BOWEL NUTRITION STATUS  ?    Continent Diet (regular diet)  ?AMBULATORY STATUS COMMUNICATION OF NEEDS Skin   ?Total Care Verbally Normal ?  ?  ?  ?    ?     ?     ? ? ?Personal Care Assistance Level of Assistance  ?Bathing, Feeding, Dressing Bathing Assistance: Limited assistance ?Feeding assistance: Limited assistance ?Dressing Assistance: Limited assistance ?   ? ?Functional  Limitations Info  ?Sight, Hearing, Speech Sight Info: Adequate ?Hearing Info: Impaired ?Speech Info: Adequate  ? ? ?SPECIAL CARE FACTORS FREQUENCY  ?PT (By licensed PT), OT (By licensed OT)   ?  ?PT Frequency: evaluate and treat ?OT Frequency: evaluate and treat ?  ?  ?  ?   ? ? ?Contractures Contractures Info: Not present  ? ? ?Additional Factors Info  ?Code Status, Allergies Code Status Info: DNR ?Allergies Info: Colcrys (Colchicine), Lovastatin ?  ?  ?  ?   ? ?Current Medications (11/14/2021):  This is the current hospital active medication list ?Current Facility-Administered Medications  ?Medication Dose Route Frequency Provider Last Rate Last Admin  ? acetaminophen (TYLENOL) tablet 650 mg  650 mg Oral Q6H PRN France Ravens, MD   650 mg at 11/13/21 2116  ? Or  ? acetaminophen (TYLENOL) suppository 650 mg  650 mg Rectal Q6H PRN France Ravens, MD      ? apixaban Arne Cleveland) tablet 2.5 mg  2.5 mg Oral BID Eppie Gibson, MD   2.5 mg at 11/14/21 0848  ? azithromycin (ZITHROMAX) tablet 500 mg  500 mg Oral Daily Eppie Gibson, MD   500 mg at 11/14/21 0848  ? cefdinir (OMNICEF) capsule 300 mg  300 mg Oral Daily Eppie Gibson, MD   300 mg at 11/14/21 0848  ? MEDLINE mouth rinse  15 mL Mouth Rinse BID McDiarmid, Blane Ohara, MD   15 mL at 11/14/21 0848  ? sulfacetamide (BLEPH-10) 10 % ophthalmic solution 1 drop  1  drop Right Eye QID Eppie Gibson, MD   1 drop at 11/14/21 0848  ? ? ? ?Discharge Medications: ?Please see discharge summary for a list of discharge medications. ? ?Relevant Imaging Results: ? ?Relevant Lab Results: ? ? ?Additional Information ?SSN-[id number] ? ?Joanne Chars, LCSW ? ? ? ? ?

## 2021-11-14 NOTE — Progress Notes (Signed)
Mobility Specialist Progress Note  ? ? 11/14/21 1438  ?Mobility  ?Activity Transferred from chair to bed  ?Level of Assistance Maximum assist, patient does 25-49%  ?Assistive Device Front wheel walker  ?Activity Response Tolerated fair  ?$Mobility charge 1 Mobility  ? ?Pt received and agreeable. C/o feeling tired and needing many cues to stay awake and keep eyes open. Left with call bell in reach and bed alarm on.  ? ?Hildred Alamin ?Mobility Specialist  ?Primary: 5N M.S. Phone: [phone] ?Secondary: 6N M.S. Phone: [phone] ?  ?

## 2021-11-14 NOTE — TOC Transition Note (Signed)
Transition of Care (TOC) - CM/SW Discharge Note ? ? ?Patient Details  ?Name: BRAELIN BROSCH ?MRN: [id number] ?Date of Birth: 1928/05/19 ? ?Transition of Care [facility]) CM/SW Contact:  ?Joanne Chars, LCSW ?Phone Number: ?11/14/2021, 12:38 PM ? ? ?Clinical Narrative:   Pt is discharging to [facility].  RN call report to [phone].   ? ? ? ?Final next level of care: Memory Care ?Barriers to Discharge: Barriers Resolved ? ? ?Patient Goals and CMS Choice ?  ?  ?  ? ?Discharge Placement ?  ?           ?Patient chooses bed at:  [facility]) ?Patient to be transferred to facility by: [facility] ?Name of family member notified: left message with daughter [name] ?Patient and family notified of of transfer: 11/14/21 ? ?Discharge Plan and Services ?  ?  ?           ?  ?  ?  ?  ?  ?  ?  ?  ?  ?  ? ?Social Determinants of Health (SDOH) Interventions ?  ? ? ?Readmission Risk Interventions ?   ? View : No data to display.  ?  ?  ?  ? ? ? ? ? ?

## 2021-11-14 NOTE — Care Management Important Message (Signed)
Important Message ? ?Patient Details  ?Name: Emily Waller ?MRN: [id number] ?Date of Birth: 10/06/1927 ? ? ?Medicare Important Message Given:  Yes ? ? ? ? ?Shawnese Magner ?11/14/2021, 3:37 PM ?

## 2021-11-14 NOTE — Discharge Summary (Signed)
Family Medicine Teaching Service ?Hospital Discharge Summary ? ?Patient name: Emily Waller Medical record number: [id number] ?Date of birth: 1928/03/10 Age: 88 y.o. Gender: female ?Date of Admission: 11/11/2021  Date of Discharge: 11/14/2021 ?Admitting Physician: Eppie Gibson, MD ? ?Primary Care Provider: Patient, No Pcp Per (Inactive) ?Consultants: Palliative Care ? ?Indication for Hospitalization: Hypoxia ? ?Discharge Diagnoses/Problem List:  ?Principal Problem: ?  CAP (community acquired pneumonia) ?Active Problems: ?  HTN (hypertension) ?  Diet-controlled diabetes mellitus ([place]) ?  Atrial fibrillation, permanent ([place]) ?  Hypoxia ?  Sepsis ([place]) ?  Blepharitis of eyelid of right eye ?  Hard of hearing ?  Chronic kidney disease, stage 3b ([place]) ? ? ?Disposition: Return to SNF with hospice ? ?Discharge Condition: Stable, at baseline ? ?Discharge Exam:  ?Gen: Frail, elderly female, pleasantly demented and in good spirits ?Cardio: Bradycardic, regular, 2/6 systolic murmur ?Resp: Normal WOB on RA, speaking in full sentences ?Abd: Soft and non-tender, non-distended ?Ext: without edema or deformity  ? ?Brief Hospital Course:  ?KETZALY CARDELLA is a 88 y.o.female with a history of Alzheimer's disease, hypertension, Gout, hyperlipidemia, diabetes, and anxiety/depression. who was admitted to the [facility] Medicine Teaching Service at [facility] for fever and hypoxia. Her hospital course is detailed below: ? ?CAP  ?Patient presented with hypoxia and fever from ALF. CXR showed mild interstitial pulmonary edema with trace left pleural effusion and adjacent left basilar opacities.  Patient was started on azithromycin and ceftriaxone.  Patient was started on 2 L nasal cannula and was weaned to room air on 4/26. Patient was given Tylenol as needed for fevers.  Patient was transitioned to oral antibiotics on 4/26. She was discharged back to her facility with plans for complete the current antibiotic course and engage hospice services as  discussed below.  ? ?Acute encephalopathy likely secondary to pneumonia ?Alzheimer's disease ?Patient was initially obtunded which was suspected to be secondary to acute hypoxemic respiratory failure in conjunction with her Alzheimer's disease.  Patient's mentation began to improve throughout hospitalization.  ?Palliative care was engaged due to patient's waxing and waning mental status and the patient's daughter elected to engage hospice upon return to the patient's skilled nursing facility.  ?See the following wishes for ongoing care as documented by our palliative care team:  ?Cardiopulmonary Resuscitation: Do Not Attempt Resuscitation (DNR/No CPR)  ?Medical Interventions: Comfort Measures: Keep clean, warm, and dry. Use medication by any route, positioning, wound care, and other measures to relieve pain and suffering. Use oxygen, suction and manual treatment of airway obstruction as needed for comfort. Do not transfer to the hospital unless comfort needs cannot be met in current location.  ?Antibiotics: Determine use of limitation of antibiotics when infection occurs  ?IV Fluids: No IV fluids (provide other measures to ensure comfort)  ?Feeding Tube: No feeding tube  ? ? ? ?Follow-up Recommendations: ?Patient will be pursuing hospice services upon her return to [facility]. She will complete her current antibiotic course as described in her family's wishes outlined above. She will complete treatment on 4/29 We will not continue for her asymptomatic atrial flutter and will instead focus on her comfort.  ?Patient given sulacetamide drops for blepharitis. Continue therapy through 4/30 ? ? ?Significant Procedures: None ? ?Significant Labs and Imaging:  ?Recent Labs  ?Lab 11/11/21 ?1640 11/12/21 ?0438 11/14/21 ?0349  ?WBC 12.7* 10.1 6.9  ?HGB 13.0 12.5 10.8*  ?HCT 40.6 37.9 32.7*  ?PLT 149* 123* 105*  ? ?Recent Labs  ?Lab 11/11/21 ?1640 11/12/21 ?2585  11/13/21 ?0145 11/14/21 ?0349  ?NA 135 136 135 136  ?K 5.1 4.1  4.4 4.2  ?CL 105 104 107 105  ?CO2 22 24 21* 23  ?GLUCOSE 195* 171* 167* 136*  ?BUN 29* 29* 29* 27*  ?CREATININE 1.25* 1.35* 1.40* 1.15*  ?CALCIUM 8.5* 8.3* 8.0* 8.5*  ?ALKPHOS  --  79  --   --   ?AST  --  14*  --   --   ?ALT  --  12  --   --   ?ALBUMIN  --  3.2*  --   --   ? ?CT HEAD WO CONTRAST (5MM) ?CLINICAL DATA:  Altered mental status ? ?EXAM: ?CT HEAD WITHOUT CONTRAST ? ?TECHNIQUE: ?Contiguous axial images were obtained from the base of the skull ?through the vertex without intravenous contrast. ? ?RADIATION DOSE REDUCTION: This exam was performed according to the ?departmental dose-optimization program which includes automated ?exposure control, adjustment of the mA and/or kV according to ?patient size and/or use of iterative reconstruction technique. ? ?COMPARISON:  02/17/2021 ? ?FINDINGS: ?Brain: No evidence of acute infarction, hemorrhage, hydrocephalus, ?extra-axial collection or mass lesion/mass effect. ? ?Mild cortical and central atrophy.  Stable ventricular prominence. ? ?Subcortical white matter and periventricular small vessel ischemic ?changes. 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 ? ?Vascular: Intracranial atherosclerosis. ? ?Skull: Normal. Negative for fracture or focal lesion. ? ?Sinuses/Orbits: Partial opacification of the bilateral mastoid air ?cells. Visualized paranasal sinuses are clear. ? ?Other: None. ? ?IMPRESSION: ?No evidence of acute intracranial abnormality. Mild atrophy with ?small vessel ischemic changes. 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 ? ?Electronically Signed ?  By: Julian Hy M.D. ?  On: 11/12/2021 00:16 ? ? ? ?Results/Tests Pending at Time of Discharge: None ? ?Discharge Medications:  ?Allergies as of 11/14/2021   ? ?   Reactions  ? Colcrys [colchicine] Nausea Only  ? Stomach upset  ? Lovastatin Other (See Comments)  ? myalgia  ? ?  ? ?  ?Medication List  ?  ? ?STOP taking these medications   ? ?acetaminophen 325 MG tablet ?Commonly known as: TYLENOL ?   ?allopurinol 100 MG tablet ?Commonly known as: ZYLOPRIM ?  ?amLODipine 5 MG tablet ?Commonly known as: NORVASC ?  ?apixaban 2.5 MG Tabs tablet ?Commonly known as: ELIQUIS ?  ?atorvastatin 10 MG tablet ?Commonly known as: LIPITOR ?  ?metoprolol tartrate 25 MG tablet ?Commonly known as: LOPRESSOR ?  ?mirtazapine 7.5 MG tablet ?Commonly known as: REMERON ?  ?nystatin ointment ?Commonly known as: MYCOSTATIN ?  ?pantoprazole 40 MG tablet ?Commonly known as: PROTONIX ?  ?traMADol 50 MG tablet ?Commonly known as: ULTRAM ?  ?Turmeric 500 MG Caps ?  ?Vitamin D (Ergocalciferol) 1.25 MG (50000 UNIT) Caps capsule ?Commonly known as: DRISDOL ?  ? ?  ? ?TAKE these medications   ? ?azithromycin 500 MG tablet ?Commonly known as: ZITHROMAX ?Take 1 tablet (500 mg total) by mouth daily for 2 days. ?  ?cefdinir 300 MG capsule ?Commonly known as: OMNICEF ?Take 1 capsule (300 mg total) by mouth daily for 2 days. ?  ?sulfacetamide 10 % ophthalmic solution ?Commonly known as: BLEPH-10 ?Place 1 drop into the right eye 4 (four) times daily for 3 days. ?  ? ?  ? ? ?Discharge Instructions: Please refer to Patient Instructions section of EMR for full details.  Patient was counseled important signs and symptoms that should prompt return to medical care, changes in medications, dietary instructions, activity restrictions, and follow up appointments.  ? ?Follow-Up Appointments: ?None ? ?  Eppie Gibson, MD ?11/14/2021, 8:16 AM ?PGY-1, [place] ? ?

## 2021-11-14 NOTE — Progress Notes (Signed)
Physical Therapy Treatment ?Patient Details ?Name: Emily Waller ?MRN: [id number] ?DOB: March 13, 1928 ?Today's Date: 11/14/2021 ? ? ?History of Present Illness 88 yo female with onset of SOB and hypoxia was admitted 4/24 for dramatic changes including being non communicative.  Pt was found to have acute hypoxic resp failure, PNA, sepsis, SIRS, new O2 need.  Pt is from ALF setting and will possibly have to demonstrate independent movement to return.  PMHx:  DM, a-flutter, Vit D deficiency, HTN, ? ?  ?PT Comments  ? ? Pt sound asleep on entry, incontinent of large amount of stool and urine. NT in room to assist with pericare. Pt requiring maximal stimulation for arousal. Pt HoH and tried to communicate need for cleaning however requires total A for rolling to be cleaned. Pt disoriented and screaming with maximal reassurance and cues for opening eyes pt calms down. Once cleaned and diaper in place, pt requiring modA for bed mobility and modAx2 for coming to standing. With encouragement pt takes pivotal steps to chair with min Ax2. Pt scheduled for discharge back to her facility this afternoon. ?   ?Recommendations for follow up therapy are one component of a multi-disciplinary discharge planning process, led by the attending physician.  Recommendations may be updated based on patient status, additional functional criteria and insurance authorization. ? ?Follow Up Recommendations ? Skilled nursing-short term rehab (<3 hours/day) ?  ?  ?Assistance Recommended at Discharge Frequent or constant Supervision/Assistance  ?Patient can return home with the following A lot of help with walking and/or transfers;A little help with bathing/dressing/bathroom;Direct supervision/assist for financial management;Direct supervision/assist for medications management;Assist for transportation ?  ?Equipment Recommendations ? None recommended by PT  ?  ?Recommendations for Other Services   ? ? ?  ?Precautions / Restrictions  Precautions ?Precautions: Fall ?Precaution Comments: monitor HR and O2 sats ?Restrictions ?Weight Bearing Restrictions: No  ?  ? ?Mobility ? Bed Mobility ?Overal bed mobility: Needs Assistance ?Bed Mobility: Supine to Sit, Rolling ?Rolling: Total assist ?  ?Supine to sit: Mod assist ?  ?  ?General bed mobility comments: pt asleep on entry, incontinent of stool and urine, NT in room to assist in cleaning, requires total A for turning for pericare, screaming with each turn despite maximal cuing and increased volume to let her know what was going on, once cleaned and pt calmed down requires modA for coming to EoB ?  ? ?Transfers ?Overall transfer level: Needs assistance ?Equipment used: Rolling walker (2 wheels), 1 person hand held assist ?Transfers: Sit to/from Stand, Bed to chair/wheelchair/BSC ?Sit to Stand: Mod assist, +2 physical assistance ?  ?Step pivot transfers: Min assist, +2 physical assistance ?  ?  ?  ?General transfer comment: modAx2 for power up into standing and increased cuing for bringing CoG over BoS, once balanced needs min Ax2 for steadying with stepping to recliner ?  ? ? ?  ?Balance Overall balance assessment: Needs assistance ?Sitting-balance support: Feet supported ?Sitting balance-Leahy Scale: Fair ?  ?  ?Standing balance support: Bilateral upper extremity supported, During functional activity ?Standing balance-Leahy Scale: Poor ?Standing balance comment: unsteady and tends to lose her balance backward ?  ?  ?  ?  ?  ?  ?  ?  ?  ?  ?  ?  ? ?  ?Cognition Arousal/Alertness: Lethargic ?Behavior During Therapy: [facility] for tasks assessed/performed ?Overall Cognitive Status: No family/caregiver present to determine baseline cognitive functioning ?  ?  ?  ?  ?  ?  ?  ?  ?  ?  ?  ?  ?  ?  ?  ?  ?  General Comments: requires increased arousal to waken, then start screaming when attempting to clean her up ?  ?  ? ?  ?   ?General Comments General comments (skin integrity, edema, etc.): VSS on RA ?  ?   ? ?Pertinent Vitals/Pain Pain Assessment ?Pain Assessment: Faces ?Faces Pain Scale: Hurts whole lot ?Pain Location: R UE ?Pain Descriptors / Indicators: Grimacing, Moaning ?Pain Intervention(s): Limited activity within patient's tolerance, Monitored during session, Repositioned  ? ? ? ?PT Goals (current goals can now be found in the care plan section) Acute Rehab PT Goals ?Patient Stated Goal: none stated ?PT Goal Formulation: Patient unable to participate in goal setting ?Time For Goal Achievement: 11/26/21 ?Potential to Achieve Goals: Fair ?Progress towards PT goals: Progressing toward goals ? ?  ?Frequency ? ? ? Min 3X/week ? ? ? ?  ?PT Plan Current plan remains appropriate  ? ? ?   ?AM-PAC PT "6 Clicks" Mobility   ?Outcome Measure ? Help needed turning from your back to your side while in a flat bed without using bedrails?: A Lot ?Help needed moving from lying on your back to sitting on the side of a flat bed without using bedrails?: A Lot ?Help needed moving to and from a bed to a chair (including a wheelchair)?: A Lot ?Help needed standing up from a chair using your arms (e.g., wheelchair or bedside chair)?: A Lot ?Help needed to walk in hospital room?: Total ?Help needed climbing 3-5 steps with a railing? : Total ?6 Click Score: 10 ? ?  ?End of Session   ?Activity Tolerance: Patient limited by fatigue ?Patient left: with call bell/phone within reach;in chair;with chair alarm set ?Nurse Communication: Mobility status ?PT Visit Diagnosis: Unsteadiness on feet (R26.81);Muscle weakness (generalized) (M62.81);Difficulty in walking, not elsewhere classified (R26.2) ?  ? ? ?Time: 8937-3428 ?PT Time Calculation (min) (ACUTE ONLY): 25 min ? ?Charges:  $Therapeutic Activity: 23-37 mins          ?          ? ?Talina Pleitez B. Migdalia Dk PT, DPT ?Acute Rehabilitation Services ?Pager [phone] ?Office [phone] ? ? ? ?[place] ?11/14/2021, 2:03 PM ? ?

## 2021-11-14 NOTE — Progress Notes (Signed)
Report called to [facility]. Spoke with Kathleene Hazel. ?

## 2021-11-19 ENCOUNTER — Ambulatory Visit (HOSPITAL_COMMUNITY): Payer: Medicare Other | Admitting: Nurse Practitioner

## 2021-12-19 DEATH — deceased

## 2022-08-28 ENCOUNTER — Encounter (HOSPITAL_COMMUNITY): Payer: Self-pay | Admitting: *Deleted
